# Patient Record
Sex: Female | Born: 1965 | Race: White | Hispanic: No | Smoking: Never smoker
Health system: Southern US, Community
[De-identification: ages and names within clinical notes are randomized; demographics above are authoritative.]

## PROBLEM LIST (undated history)

## (undated) DIAGNOSIS — F909 Attention-deficit hyperactivity disorder, unspecified type: Secondary | ICD-10-CM

## (undated) DIAGNOSIS — L719 Rosacea, unspecified: Secondary | ICD-10-CM

## (undated) DIAGNOSIS — R0681 Apnea, not elsewhere classified: Secondary | ICD-10-CM

## (undated) DIAGNOSIS — R011 Cardiac murmur, unspecified: Secondary | ICD-10-CM

## (undated) DIAGNOSIS — G35 Multiple sclerosis: Secondary | ICD-10-CM

## (undated) DIAGNOSIS — D126 Benign neoplasm of colon, unspecified: Secondary | ICD-10-CM

## (undated) DIAGNOSIS — A6000 Herpesviral infection of urogenital system, unspecified: Secondary | ICD-10-CM

## (undated) DIAGNOSIS — K589 Irritable bowel syndrome without diarrhea: Secondary | ICD-10-CM

## (undated) DIAGNOSIS — J309 Allergic rhinitis, unspecified: Secondary | ICD-10-CM

## (undated) DIAGNOSIS — H1045 Other chronic allergic conjunctivitis: Secondary | ICD-10-CM

## (undated) DIAGNOSIS — I82409 Acute embolism and thrombosis of unspecified deep veins of unspecified lower extremity: Secondary | ICD-10-CM

## (undated) DIAGNOSIS — H469 Unspecified optic neuritis: Secondary | ICD-10-CM

## (undated) DIAGNOSIS — R000 Tachycardia, unspecified: Secondary | ICD-10-CM

## (undated) DIAGNOSIS — K219 Gastro-esophageal reflux disease without esophagitis: Secondary | ICD-10-CM

## (undated) DIAGNOSIS — R251 Tremor, unspecified: Secondary | ICD-10-CM

## (undated) DIAGNOSIS — F509 Eating disorder, unspecified: Secondary | ICD-10-CM

## (undated) DIAGNOSIS — K900 Celiac disease: Secondary | ICD-10-CM

## (undated) DIAGNOSIS — N200 Calculus of kidney: Secondary | ICD-10-CM

## (undated) DIAGNOSIS — I10 Essential (primary) hypertension: Secondary | ICD-10-CM

## (undated) DIAGNOSIS — F411 Generalized anxiety disorder: Secondary | ICD-10-CM

## (undated) DIAGNOSIS — J45909 Unspecified asthma, uncomplicated: Secondary | ICD-10-CM

## (undated) HISTORY — DX: Allergic rhinitis, unspecified: J30.9

## (undated) HISTORY — DX: Unspecified optic neuritis: H46.9

## (undated) HISTORY — DX: Cardiac murmur, unspecified: R01.1

## (undated) HISTORY — PX: OTHER SURGICAL HISTORY: SHX169

## (undated) HISTORY — DX: Unspecified asthma, uncomplicated: J45.909

## (undated) HISTORY — DX: Celiac disease: K90.0

## (undated) HISTORY — DX: Attention-deficit hyperactivity disorder, unspecified type: F90.9

## (undated) HISTORY — DX: Apnea, not elsewhere classified: R06.81

## (undated) HISTORY — DX: Tremor, unspecified: R25.1

## (undated) HISTORY — DX: Multiple sclerosis: G35

## (undated) HISTORY — DX: Tachycardia, unspecified: R00.0

## (undated) HISTORY — DX: Eating disorder, unspecified: F50.9

## (undated) HISTORY — DX: Other chronic allergic conjunctivitis: H10.45

## (undated) HISTORY — DX: Herpesviral infection of urogenital system, unspecified: A60.00

## (undated) HISTORY — DX: Calculus of kidney: N20.0

## (undated) HISTORY — DX: Generalized anxiety disorder: F41.1

## (undated) HISTORY — DX: Rosacea, unspecified: L71.9

## (undated) HISTORY — DX: Irritable bowel syndrome, unspecified: K58.9

## (undated) HISTORY — DX: Benign neoplasm of colon, unspecified: D12.6

---
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
## 1994-07-08 HISTORY — PX: ROTATOR CUFF REPAIR: SHX139

## 2000-07-08 DIAGNOSIS — I82409 Acute embolism and thrombosis of unspecified deep veins of unspecified lower extremity: Secondary | ICD-10-CM

## 2000-07-08 HISTORY — DX: Acute embolism and thrombosis of unspecified deep veins of unspecified lower extremity: I82.409

## 2000-07-08 HISTORY — PX: CHOLECYSTECTOMY: SHX55

## 2001-10-21 ENCOUNTER — Other Ambulatory Visit: Admission: RE | Admit: 2001-10-21 | Discharge: 2001-10-21 | Payer: Self-pay | Admitting: *Deleted

## 2001-10-26 ENCOUNTER — Encounter: Admission: RE | Admit: 2001-10-26 | Discharge: 2001-10-26 | Payer: Self-pay | Admitting: *Deleted

## 2001-10-26 ENCOUNTER — Encounter: Payer: Self-pay | Admitting: *Deleted

## 2001-12-17 ENCOUNTER — Encounter: Payer: Self-pay | Admitting: Emergency Medicine

## 2001-12-17 ENCOUNTER — Emergency Department (HOSPITAL_COMMUNITY): Admission: EM | Admit: 2001-12-17 | Discharge: 2001-12-17 | Payer: Self-pay | Admitting: Emergency Medicine

## 2002-04-24 ENCOUNTER — Encounter: Payer: Self-pay | Admitting: Emergency Medicine

## 2002-04-24 ENCOUNTER — Inpatient Hospital Stay (HOSPITAL_COMMUNITY): Admission: EM | Admit: 2002-04-24 | Discharge: 2002-05-14 | Payer: Self-pay | Admitting: Emergency Medicine

## 2002-04-26 ENCOUNTER — Encounter: Payer: Self-pay | Admitting: *Deleted

## 2002-04-27 ENCOUNTER — Encounter: Payer: Self-pay | Admitting: *Deleted

## 2002-04-29 ENCOUNTER — Encounter: Payer: Self-pay | Admitting: *Deleted

## 2002-05-03 ENCOUNTER — Encounter: Payer: Self-pay | Admitting: *Deleted

## 2002-05-07 ENCOUNTER — Encounter: Payer: Self-pay | Admitting: *Deleted

## 2002-05-13 ENCOUNTER — Encounter: Payer: Self-pay | Admitting: *Deleted

## 2002-05-18 ENCOUNTER — Emergency Department (HOSPITAL_COMMUNITY): Admission: EM | Admit: 2002-05-18 | Discharge: 2002-05-18 | Payer: Self-pay | Admitting: Emergency Medicine

## 2002-05-18 ENCOUNTER — Encounter: Payer: Self-pay | Admitting: Emergency Medicine

## 2002-09-21 ENCOUNTER — Encounter: Admission: RE | Admit: 2002-09-21 | Discharge: 2002-09-21 | Payer: Self-pay | Admitting: Gastroenterology

## 2002-09-21 ENCOUNTER — Encounter: Payer: Self-pay | Admitting: Gastroenterology

## 2002-10-25 ENCOUNTER — Ambulatory Visit (HOSPITAL_COMMUNITY): Admission: RE | Admit: 2002-10-25 | Discharge: 2002-10-25 | Payer: Self-pay | Admitting: Gastroenterology

## 2002-10-26 ENCOUNTER — Encounter: Payer: Self-pay | Admitting: Emergency Medicine

## 2002-10-26 ENCOUNTER — Emergency Department (HOSPITAL_COMMUNITY): Admission: EM | Admit: 2002-10-26 | Discharge: 2002-10-26 | Payer: Self-pay | Admitting: Emergency Medicine

## 2002-11-18 ENCOUNTER — Ambulatory Visit (HOSPITAL_COMMUNITY): Admission: RE | Admit: 2002-11-18 | Discharge: 2002-11-18 | Payer: Self-pay | Admitting: Family Medicine

## 2002-11-18 ENCOUNTER — Encounter: Payer: Self-pay | Admitting: Family Medicine

## 2002-11-25 ENCOUNTER — Encounter: Payer: Self-pay | Admitting: Family Medicine

## 2002-11-25 ENCOUNTER — Encounter: Payer: Self-pay | Admitting: Radiology

## 2002-11-25 ENCOUNTER — Encounter: Admission: RE | Admit: 2002-11-25 | Discharge: 2002-11-25 | Payer: Self-pay | Admitting: Family Medicine

## 2002-12-07 ENCOUNTER — Encounter: Payer: Self-pay | Admitting: Family Medicine

## 2002-12-07 ENCOUNTER — Encounter: Admission: RE | Admit: 2002-12-07 | Discharge: 2002-12-07 | Payer: Self-pay | Admitting: Family Medicine

## 2003-05-10 ENCOUNTER — Encounter: Admission: RE | Admit: 2003-05-10 | Discharge: 2003-05-10 | Payer: Self-pay | Admitting: Internal Medicine

## 2003-07-09 HISTORY — PX: APPENDECTOMY: SHX54

## 2003-07-27 ENCOUNTER — Encounter (INDEPENDENT_AMBULATORY_CARE_PROVIDER_SITE_OTHER): Payer: Self-pay | Admitting: Specialist

## 2003-07-27 ENCOUNTER — Inpatient Hospital Stay (HOSPITAL_COMMUNITY): Admission: EM | Admit: 2003-07-27 | Discharge: 2003-07-29 | Payer: Self-pay | Admitting: Emergency Medicine

## 2003-08-17 ENCOUNTER — Encounter: Admission: RE | Admit: 2003-08-17 | Discharge: 2003-08-17 | Payer: Self-pay | Admitting: Internal Medicine

## 2003-08-18 ENCOUNTER — Ambulatory Visit (HOSPITAL_COMMUNITY): Admission: RE | Admit: 2003-08-18 | Discharge: 2003-08-18 | Payer: Self-pay | Admitting: Gastroenterology

## 2003-09-05 ENCOUNTER — Encounter: Admission: RE | Admit: 2003-09-05 | Discharge: 2003-09-05 | Payer: Self-pay | Admitting: Internal Medicine

## 2003-10-17 ENCOUNTER — Ambulatory Visit (HOSPITAL_COMMUNITY): Admission: RE | Admit: 2003-10-17 | Discharge: 2003-10-17 | Payer: Self-pay | Admitting: Obstetrics and Gynecology

## 2004-09-07 ENCOUNTER — Inpatient Hospital Stay (HOSPITAL_COMMUNITY): Admission: AD | Admit: 2004-09-07 | Discharge: 2004-09-11 | Payer: Self-pay | Admitting: Pediatrics

## 2005-09-30 ENCOUNTER — Ambulatory Visit (HOSPITAL_COMMUNITY): Admission: RE | Admit: 2005-09-30 | Discharge: 2005-09-30 | Payer: Self-pay | Admitting: Cardiovascular Disease

## 2005-12-15 ENCOUNTER — Emergency Department (HOSPITAL_COMMUNITY): Admission: EM | Admit: 2005-12-15 | Discharge: 2005-12-15 | Payer: Self-pay | Admitting: Emergency Medicine

## 2006-07-08 HISTORY — PX: ABDOMINAL HYSTERECTOMY: SHX81

## 2006-09-05 ENCOUNTER — Encounter (INDEPENDENT_AMBULATORY_CARE_PROVIDER_SITE_OTHER): Payer: Self-pay | Admitting: Specialist

## 2006-09-05 ENCOUNTER — Ambulatory Visit (HOSPITAL_COMMUNITY): Admission: RE | Admit: 2006-09-05 | Discharge: 2006-09-05 | Payer: Self-pay | Admitting: Obstetrics and Gynecology

## 2006-09-19 ENCOUNTER — Ambulatory Visit (HOSPITAL_COMMUNITY): Admission: RE | Admit: 2006-09-19 | Discharge: 2006-09-19 | Payer: Self-pay | Admitting: Family Medicine

## 2006-11-26 ENCOUNTER — Ambulatory Visit (HOSPITAL_COMMUNITY): Admission: RE | Admit: 2006-11-26 | Discharge: 2006-11-26 | Payer: Self-pay | Admitting: Obstetrics and Gynecology

## 2006-11-26 ENCOUNTER — Encounter (INDEPENDENT_AMBULATORY_CARE_PROVIDER_SITE_OTHER): Payer: Self-pay | Admitting: Obstetrics and Gynecology

## 2007-01-26 ENCOUNTER — Ambulatory Visit: Payer: Self-pay | Admitting: Family Medicine

## 2007-06-22 ENCOUNTER — Encounter (INDEPENDENT_AMBULATORY_CARE_PROVIDER_SITE_OTHER): Payer: Self-pay | Admitting: Obstetrics and Gynecology

## 2007-06-22 ENCOUNTER — Inpatient Hospital Stay (HOSPITAL_COMMUNITY): Admission: RE | Admit: 2007-06-22 | Discharge: 2007-06-24 | Payer: Self-pay | Admitting: Obstetrics and Gynecology

## 2007-07-11 ENCOUNTER — Inpatient Hospital Stay (HOSPITAL_COMMUNITY): Admission: AD | Admit: 2007-07-11 | Discharge: 2007-07-12 | Payer: Self-pay | Admitting: Obstetrics and Gynecology

## 2007-10-31 ENCOUNTER — Ambulatory Visit (HOSPITAL_COMMUNITY): Admission: RE | Admit: 2007-10-31 | Discharge: 2007-10-31 | Payer: Self-pay | Admitting: Orthopedic Surgery

## 2008-01-09 ENCOUNTER — Emergency Department (HOSPITAL_BASED_OUTPATIENT_CLINIC_OR_DEPARTMENT_OTHER): Admission: EM | Admit: 2008-01-09 | Discharge: 2008-01-09 | Payer: Self-pay | Admitting: Emergency Medicine

## 2008-02-19 ENCOUNTER — Emergency Department (HOSPITAL_BASED_OUTPATIENT_CLINIC_OR_DEPARTMENT_OTHER): Admission: EM | Admit: 2008-02-19 | Discharge: 2008-02-19 | Payer: Self-pay | Admitting: Emergency Medicine

## 2008-02-29 ENCOUNTER — Emergency Department (HOSPITAL_BASED_OUTPATIENT_CLINIC_OR_DEPARTMENT_OTHER): Admission: EM | Admit: 2008-02-29 | Discharge: 2008-02-29 | Payer: Self-pay | Admitting: Emergency Medicine

## 2008-07-31 ENCOUNTER — Emergency Department (HOSPITAL_BASED_OUTPATIENT_CLINIC_OR_DEPARTMENT_OTHER): Admission: EM | Admit: 2008-07-31 | Discharge: 2008-07-31 | Payer: Self-pay | Admitting: Emergency Medicine

## 2008-11-22 ENCOUNTER — Ambulatory Visit: Payer: Self-pay | Admitting: Family Medicine

## 2008-11-25 ENCOUNTER — Ambulatory Visit: Payer: Self-pay | Admitting: Family Medicine

## 2008-12-02 ENCOUNTER — Ambulatory Visit: Payer: Self-pay | Admitting: Family Medicine

## 2009-05-23 ENCOUNTER — Ambulatory Visit: Payer: Self-pay | Admitting: Family Medicine

## 2009-08-18 ENCOUNTER — Ambulatory Visit: Payer: Self-pay | Admitting: Family Medicine

## 2009-09-05 ENCOUNTER — Ambulatory Visit: Payer: Self-pay | Admitting: Family Medicine

## 2009-09-05 ENCOUNTER — Encounter: Payer: Self-pay | Admitting: Internal Medicine

## 2009-09-05 HISTORY — PX: CARDIAC CATHETERIZATION: SHX172

## 2009-09-07 ENCOUNTER — Inpatient Hospital Stay (HOSPITAL_COMMUNITY): Admission: AD | Admit: 2009-09-07 | Discharge: 2009-09-09 | Payer: Self-pay | Admitting: Cardiovascular Disease

## 2009-09-08 ENCOUNTER — Encounter (INDEPENDENT_AMBULATORY_CARE_PROVIDER_SITE_OTHER): Payer: Self-pay | Admitting: Cardiovascular Disease

## 2009-10-20 ENCOUNTER — Encounter: Payer: Self-pay | Admitting: Internal Medicine

## 2009-10-23 ENCOUNTER — Ambulatory Visit: Payer: Self-pay | Admitting: Internal Medicine

## 2009-10-23 DIAGNOSIS — I10 Essential (primary) hypertension: Secondary | ICD-10-CM | POA: Insufficient documentation

## 2009-10-23 DIAGNOSIS — J309 Allergic rhinitis, unspecified: Secondary | ICD-10-CM

## 2009-10-23 DIAGNOSIS — R0602 Shortness of breath: Secondary | ICD-10-CM

## 2009-10-23 DIAGNOSIS — G35 Multiple sclerosis: Secondary | ICD-10-CM

## 2009-10-23 DIAGNOSIS — K219 Gastro-esophageal reflux disease without esophagitis: Secondary | ICD-10-CM | POA: Insufficient documentation

## 2009-10-23 DIAGNOSIS — F909 Attention-deficit hyperactivity disorder, unspecified type: Secondary | ICD-10-CM | POA: Insufficient documentation

## 2009-10-25 ENCOUNTER — Ambulatory Visit: Payer: Self-pay | Admitting: Internal Medicine

## 2009-10-26 ENCOUNTER — Telehealth (INDEPENDENT_AMBULATORY_CARE_PROVIDER_SITE_OTHER): Payer: Self-pay | Admitting: *Deleted

## 2009-11-01 ENCOUNTER — Telehealth: Payer: Self-pay | Admitting: Internal Medicine

## 2009-11-08 ENCOUNTER — Ambulatory Visit (HOSPITAL_COMMUNITY): Admission: RE | Admit: 2009-11-08 | Discharge: 2009-11-08 | Payer: Self-pay | Admitting: Internal Medicine

## 2009-11-08 ENCOUNTER — Encounter: Payer: Self-pay | Admitting: Internal Medicine

## 2009-11-13 ENCOUNTER — Telehealth (INDEPENDENT_AMBULATORY_CARE_PROVIDER_SITE_OTHER): Payer: Self-pay | Admitting: *Deleted

## 2009-11-22 ENCOUNTER — Ambulatory Visit: Payer: Self-pay | Admitting: Internal Medicine

## 2010-04-30 ENCOUNTER — Encounter: Admission: RE | Admit: 2010-04-30 | Discharge: 2010-04-30 | Payer: Self-pay | Admitting: Family Medicine

## 2010-05-02 ENCOUNTER — Encounter: Admission: RE | Admit: 2010-05-02 | Discharge: 2010-05-02 | Payer: Self-pay | Admitting: Family Medicine

## 2010-05-04 ENCOUNTER — Ambulatory Visit (HOSPITAL_COMMUNITY): Admission: RE | Admit: 2010-05-04 | Discharge: 2010-05-04 | Payer: Self-pay | Admitting: Family Medicine

## 2010-05-17 ENCOUNTER — Ambulatory Visit (HOSPITAL_COMMUNITY): Admission: RE | Admit: 2010-05-17 | Discharge: 2010-05-17 | Payer: Self-pay | Admitting: Family Medicine

## 2010-05-25 ENCOUNTER — Ambulatory Visit: Payer: Self-pay | Admitting: Internal Medicine

## 2010-06-07 ENCOUNTER — Ambulatory Visit: Payer: Self-pay | Admitting: Internal Medicine

## 2010-06-07 HISTORY — PX: OTHER SURGICAL HISTORY: SHX169

## 2010-07-08 ENCOUNTER — Ambulatory Visit: Payer: Self-pay | Admitting: Internal Medicine

## 2010-07-18 ENCOUNTER — Ambulatory Visit: Payer: Self-pay | Admitting: Internal Medicine

## 2010-07-29 ENCOUNTER — Encounter: Payer: Self-pay | Admitting: Obstetrics and Gynecology

## 2010-08-01 ENCOUNTER — Ambulatory Visit: Payer: Self-pay | Admitting: Internal Medicine

## 2010-08-07 NOTE — Progress Notes (Signed)
Summary: results-lmtcb  Phone Note Call from Patient   Caller: Patient Call For: [name] Summary of Call: calling for pft results Initial call taken by: Rickard Patience,  November 01, 2009 1:22 PM  Follow-up for Phone Call        Please advise PFT results thank you [name]  November 01, 2009 1:50 PM   Additional Follow-up for Phone Call Additional follow up Details #1::        pft 10/25/2009 are essentially normal/near normal. Does not explain dyspnea. Pls set up methacnoline challenge test (cannot be pregnant) and if on advair or symbicort needs to be off it for 2-3 weeks. Needs fu appt after mc test Additional Follow-up by: Kalman Shan MD,  November 02, 2009 3:59 PM    Additional Follow-up for Phone Call Additional follow up Details #2::    LMTCB.[name] CMA  November 02, 2009 4:03 PM  pt advsied of results. order palced for methacoline test and f/u after with MR. [name] CMA  November 02, 2009 4:28 PM

## 2010-08-07 NOTE — Miscellaneous (Signed)
Summary: Orders Update pft charges  Clinical Lists Changes  Orders: Added new Service order of Carbon Monoxide diffusing w/capacity (94720) - Signed Added new Service order of Lung Volumes (94240) - Signed Added new Service order of Spirometry (Pre & Post) (94060) - Signed 

## 2010-08-07 NOTE — Letter (Signed)
Summary: [facility]   Imported By: Lester [place] 10/26/2009 12:43:14  _____________________________________________________________________  External Attachment:    Type:   Image     Comment:   External Document

## 2010-08-07 NOTE — Letter (Signed)
Summary: [facility]   Imported By: Lester [place] 10/26/2009 12:46:27  _____________________________________________________________________  External Attachment:    Type:   Image     Comment:   External Document

## 2010-08-07 NOTE — Progress Notes (Signed)
Summary: MCT results  Phone Note Call from Patient Call back at Home Phone [phone]   Caller: Patient Call For: ramasawmy Summary of Call: pt wants the results of methocoline test Initial call taken by: Lacinda Axon,  Nov 13, 2009 12:28 PM  Follow-up for Phone Call        Please advise results of MCT, thanks Vernie Murders  Nov 13, 2009 1:10 PM   Additional Follow-up for Phone Call Additional follow up Details #1::        pc 20 IS 4MG /DL.  This is a positive test . Please have Byrum do the official read. You can tell her it is prelim positive. Have her come in to discuss Rx options Additional Follow-up by: Kalman Shan MD,  Nov 14, 2009 6:27 PM    Additional Follow-up for Phone Call Additional follow up Details #2::    Spoke with pt and advised per MR that preliminary results do show positive test.  Appt was sched with MR to discuss these results for 11/27/09 at 3:10 pm.  Will forward this to RB per MR request.   Follow-up by: Vernie Murders,  Nov 15, 2009 9:00 AM  Additional Follow-up for Phone Call Additional follow up Details #3:: Details for Additional Follow-up Action Taken: It has already been read. I agree with results as above. RSB Additional Follow-up by: Leslye Peer MD,  Nov 15, 2009 9:08 AM

## 2010-08-07 NOTE — Assessment & Plan Note (Signed)
Summary: doe and fatigue/jd   Visit Type:  Initial Consult Primary Provider/Referring Provider:  Dr. Nilda Simmer - PMD, Dr Erlene Quan - Cards, Dr Loreta Ave - GI  CC:  Pulmonary consult for SOb with exertion x 2 months. .  History of Present Illness: 45 year old  obese female with severe GERD with 30# intentional weight loss since Sept 2010.  Had pneumonia in Nov 2010 treated as outpatient (reportedly cxr showed it). Had flu like illness 08/18/2009. After that exhaustion. 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 Resolved but recurred 7-10 days later. Therefore, admitted at [facility] under Dr. Erlene Quan for cardiac cath Mrach 4-5, 2011. CT angio September 09, 2009 and Left heart cath September 09, 2009 was normal (reviewed in [place]). Since then or even before that started noticing dyspnea but chest pain has resolved But definitely dyspnea since flu but not before flu in Feb 2011. Insidious onset. STable symptoms since onset but definitely not getting better. Overall severity is moderate. Dyspnea mostly brought on by exertion and relieved by rest. She is also reporting paroxysmal nocturnal dyspnea 10 times each month (wakes up diaphoretic, dyspneic, dizzy, tachycardic and "low bp in 90 sbp", palpitations). Occ does have daytime palpitatons. Denies orthopnea (uses 3 pillow usage for past 9-10 months due to GERD), chest pains, fevers.   Of note, OSA workup pending at [facility] dr. Allyson Sabal due to nocturnal symptoms.  pscyhosocial stressors - son car issues, son wiht finger suture, friend had cholecystectomy  NOTE: hx from echart, outside records and patient  Preventive Screening-Counseling & Management  Alcohol-Tobacco     Smoking Status: never  Current Medications (verified): 1)  Adderall Xr 30 Mg Xr24h-Cap (Amphetamine-Dextroamphetamine) .... Take 1 Tablet By Mouth Once A Day 2)  Adderall Xr 5 Mg Xr24h-Cap (Amphetamine-Dextroamphetamine) .... Take 1 Tablet By Mouth Once A Day 3)  Concerta 54 Mg  Cr-Tabs (Methylphenidate Hcl) .... Take 1 Tablet By Mouth Once A Day 4)  Valtrex 500 Mg Tabs (Valacyclovir Hcl) .... Take 1 Tablet By Mouth Once A Day 5)  Hydrochlorothiazide 25 Mg Tabs (Hydrochlorothiazide) .... Take 1 Tablet By Mouth Once A Day 6)  Dexilant 60 Mg Cpdr (Dexlansoprazole) .... Take 1 Tablet By Mouth Once A Day  Allergies (verified): 1)  ! * Ketek 2)  ! Levaquin 3)  ! Erythromycin 4)  ! Codeine 5)  ! * Oxycotin 6)  ! * Shellfish  Past History:  Family History: Last updated: 10/23/2009 Mother-HTN, 2 DVT Father-HTN MGM-colon cancer M aunt-colon cancer Son had childhood asthma but outgrew  Social History: Last updated: 10/23/2009 Divorced. lives with son and friend Has one son Patient never smoked.  attends [employer] No passive smoking Son born in [place]  Risk Factors: Smoking Status: never (10/23/2009)  Past Medical History: #Allergic Rhinitis #G E R D...Marland KitchenMarland KitchenDr Loreta Ave #Obesity...weight watchers > 30# weight loss Sept 2010 - April 2011  with improvment in GERD. 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 Admitted for many weeks at [facility] in [place], [place]  # Left "behind the knee vein" blood clot  summer 2010.Marland KitchenMarland KitchenMarland KitchenDr Esaw Dace > sounds like SVT. Was treated as outpatient. Rx with Mobic. And was reassured "it wont move"  Herpes genital-054.10 HIV negative 2010 ADHD-314.00 Multiple Sclerosis  Labs ordered by R. Nilda Simmer 10/20/09: Hgb-14.9, hematocrit-42.7, BUN-12,  CREATININE-0.84, AST-18, ALT-21, TSH-2.050  Past Surgical History: Appendectomy-2005 Cholecystectomy-2003 Angioplasty-09-2009 T A H and B S O-2009  Family History: Mother-HTN, 2 DVT Father-HTN MGM-colon cancer M aunt-colon cancer Son had childhood asthma but outgrew  Social History: Divorced. lives with son and friend Has  one son Patient never smoked.  attends [employer] No passive smoking Son born in 1990Smoking Status:  never  Review of Systems       The patient complains of shortness of breath with activity, shortness of breath at rest, chest pain, irregular heartbeats, acid heartburn, and weight change.  The patient denies productive cough, non-productive cough, coughing up blood, indigestion, loss of appetite, abdominal pain, difficulty swallowing, sore throat, tooth/dental problems, headaches, nasal congestion/difficulty breathing through nose, sneezing, itching, ear ache, anxiety, depression, hand/feet swelling, joint stiffness or pain, rash, change in color of mucus, and fever.         30# interntional weight loss  Vital Signs:  Patient profile:   45 year old female Height:      60 inches Weight:      171 pounds BMI:     33.52 O2 Sat:      100 % on Room air Temp:     98 degrees F oral Pulse rate:   70 / minute BP sitting:   120 / 70  (right arm) Cuff size:   regular  Vitals Entered By: Carron Curie CMA (October 23, 2009 10:09 AM)  O2 Flow:  Room air CC: Pulmonary consult for SOb with exertion x 2 months.   Does patient need assistance? Ambulation Bedbound Comments Ambulatory Pulse Oximetry  Resting; HR_95____    02 Sat_100____  Lap1 (185 feet)   HR_91____   02 Sat_97____ Lap2 (185 feet)   HR__92___   02 Sat_96____    Lap3 (185 feet)   HR_85____   02 Sat__92___  _X__Test Completed without Difficulty ___Test Stopped due to: Denna Haggard, CMA  October 23, 2009 11:00 AM      Physical Exam  General:  well developed, well nourished, in no acute distressobese.   Head:  normocephalic and atraumatic Eyes:  PERRLA/EOM intact; conjunctiva and sclera clear Ears:  TMs intact and clear with normal canals Nose:  no deformity, discharge, inflammation, or lesions Mouth:  no deformity or lesions Neck:  no masses, thyromegaly, or abnormal cervical nodes Chest Wall:  no  deformities noted Lungs:  clear bilaterally to auscultation and percussion Heart:  regular rate and rhythm, S1, S2 without murmurs, rubs, gallops, or clicks Abdomen:  bowel sounds positive; abdomen soft and non-tender without masses, or organomegaly Msk:  no deformity or scoliosis noted with normal posture Pulses:  pulses normal Extremities:  no clubbing, cyanosis, edema, or deformity noted Neurologic:  CN II-XII grossly intact with normal reflexes, coordination, muscle strength and tone Skin:  intact without lesions or rashes Cervical Nodes:  no significant adenopathy Axillary Nodes:  no significant adenopathy Psych:  alert and cooperative; normal mood and affect; normal attention span and concentration   CXR  Procedure date:  08/18/2009  Findings:      outside cxr personally reviwed: looks clear to me withouth abnormalities  CT of Chest  Procedure date:  09/09/2009  Findings:      personally reviwed. done at [facility]. Low lung volumes with dependent atelctasis. No PE  Comments:      independently reviwed and agree  MISC. Report  Procedure date:  10/20/2009  Findings:      Labs ordered  by Carole Binning 10/20/09: Hgb-14.9, hematocrit-42.7, BUN-12, CREATININE-0.84, AST-18, ALT-21, TSH-2.050  Impression & Recommendations:  Problem # 1:  SHORTNESS OF BREATH (SOB) (ICD-786.05) Assessment New  Possibly due to post flu deconditioning versus obesity versus occult asthma. Doubt pulmonary hypertension  plan full PFT if normal do methacholine challenges test versus CPST  Orders: Consultation Level V (16109) Pulmonary Referral (Pulmonary)  Medications Added to Medication List This Visit: 1)  Adderall Xr 30 Mg Xr24h-cap (Amphetamine-dextroamphetamine) .... Take 1 tablet by mouth once a day 2)  Adderall Xr 5 Mg Xr24h-cap (Amphetamine-dextroamphetamine) .... Take 1 tablet by mouth once a day 3)  Concerta 54 Mg Cr-tabs (Methylphenidate hcl) .... Take 1 tablet by mouth once a  day 4)  Valtrex 500 Mg Tabs (Valacyclovir hcl) .... Take 1 tablet by mouth once a day 5)  Hydrochlorothiazide 25 Mg Tabs (Hydrochlorothiazide) .... Take 1 tablet by mouth once a day 6)  Dexilant 60 Mg Cpdr (Dexlansoprazole) .... Take 1 tablet by mouth once a day  Patient Instructions: 1)  please have full pft breathing test 2)  based on the results we will advice on next step 3)  please call us after breathing test

## 2010-08-07 NOTE — Progress Notes (Signed)
Summary: results-LMTCB x 1   Phone Note Call from Patient Call back at Home Phone [phone]   Caller: Patient Call For: ramaswamy Reason for Call: Talk to Nurse, Talk to Doctor, Lab or Test Results Summary of Call: results of pft and directions for next step. Initial call taken by: Eugene Gavia,  October 26, 2009 2:03 PM  Follow-up for Phone Call        Pt calling for PFT results.  Test was done on 4/20.  MR not back in the office until 4/21.  LMTCB. Vernie Murders  October 26, 2009 2:37 PM  returned phone call Darletta Moll  October 26, 2009 2:46 PM  Spoke with pt and advised that we will place her results in MR's looat for him to review when he returns to office next wk on 4/27.  Pt verbalized understanding.  Pls advise results once reviewed thanks   Follow-up by: Vernie Murders,  October 26, 2009 2:51 PM

## 2010-08-07 NOTE — Assessment & Plan Note (Signed)
Summary: discuss methacholine challenge test   Visit Type:  Follow-up Primary Provider/Referring Provider:  Dr. Nilda Simmer - PMD, Dr Erlene Quan - Cards, Dr Loreta Ave - GI  CC:  Pt here for test results and pt still has SOB.  History of Present Illness: 45 year old  obese female with severe GERD with 30# intentional weight loss since Sept 2010.  Had pneumonia in Nov 2010 treated as outpatient (reportedly cxr showed it). Had flu like illness 08/18/2009. After that exhaustion. 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 Resolved but recurred 7-10 days later. Therefore, admitted at [facility] under Dr. Erlene Quan for cardiac cath Mrach 4-5, 2011. CT angio September 09, 2009 and Left heart cath September 09, 2009 was normal (reviewed in [place]). Since then or even before that started noticing dyspnea but chest pain has resolved But definitely dyspnea since flu but not before flu in Feb 2011. Insidious onset. STable symptoms since onset but definitely not getting better. Overall severity is moderate. Dyspnea mostly brought on by exertion and relieved by rest. She is also reporting paroxysmal nocturnal dyspnea 10 times each month (wakes up diaphoretic, dyspneic, dizzy, tachycardic and "low bp in 90 sbp", palpitations). Occ does have daytime palpitatons. Denies orthopnea (uses 3 pillow usage for past 9-10 months due to GERD), chest pains, fevers.   Of note, OSA workup pending at [facility] dr. Allyson Sabal due to nocturnal symptoms.  pscyhosocial stressors - son car issues, son wiht finger suture, friend had cholecystectomy  NOTE: hx from echart, outside records and patient  REC PFT -> if negative methacholine challenge testOV 11/22/2009: Followup dyspnea. Had PFTs 10/25/2009. I reviewed this and was normal. Had methacholine challenge test on 11/08/2009. This was positive for asthma at PC20 of 4. I personally reviewed the test. In the interim since lsat visit she is doing the same. No change in dyspnea. Still dyspneic  on exertion and on exposure to humid or warm air. Still with paroxysmal nocturnal dyspnea. No new symptoms. Overall rates symptoms as moderate. Associated GERD is better controlled but weight loss has plateaued.   Current Medications (verified): 1)  Adderall Xr 30 Mg Xr24h-Cap (Amphetamine-Dextroamphetamine) .... Take 1 Tablet By Mouth Once A Day 2)  Adderall Xr 5 Mg Xr24h-Cap (Amphetamine-Dextroamphetamine) .... Take 1 Tablet By Mouth Once A Day 3)  Valtrex 500 Mg Tabs (Valacyclovir Hcl) .... Take 1 Tablet By Mouth Once A Day 4)  Hydrochlorothiazide 25 Mg Tabs (Hydrochlorothiazide) .... Take 1 Tablet By Mouth Once A Day 5)  Dexilant 60 Mg Cpdr (Dexlansoprazole) .... Take 1 Tablet By Mouth Once A Day  Allergies: 1)  ! * Ketek 2)  ! Levaquin 3)  ! Erythromycin 4)  ! Codeine 5)  ! * Oxycotin 6)  ! * Shellfish  Past History:  Family History: Last updated: 10/23/2009 Mother-HTN, 2 DVT Father-HTN MGM-colon cancer M aunt-colon cancer Son had childhood asthma but outgrew  Social History: Last updated: 10/23/2009 Divorced. lives with son and friend Has one son Patient never smoked.  attends [employer] No passive smoking Son born in 1990  Risk Factors: Smoking Status: never (10/23/2009)  Past Medical History: Reviewed history from 10/23/2009 and no changes required. #Allergic Rhinitis #G E R D...Marland KitchenMarland KitchenDr Loreta Ave #Obesity...weight watchers > 30# weight loss Sept 2010 - April 2011  with improvment in GERD. > wegith loss undertaken due to severe GERD > Denies use of weight loss pill #Hypertension #Kidney Stone #Hx of thrombosis of lower extremity at age 30 >  Rt femoral artery age 38. Admitted for many weeks at [facility] in [place], [place]  # Left "behind the knee vein" blood clot  summer 2010.Marland KitchenMarland KitchenMarland KitchenDr Esaw Dace > sounds like SVT. Was treated as outpatient. Rx with Mobic. And was reassured "it wont move"  Herpes genital-054.10 HIV negative  2010 ADHD-314.00 Multiple Sclerosis  Labs ordered by R. Nilda Simmer 10/20/09: Hgb-14.9, hematocrit-42.7, BUN-12, CREATININE-0.84, AST-18, ALT-21, TSH-2.050  Past Surgical History: Reviewed history from 10/23/2009 and no changes required. Appendectomy-2005 Cholecystectomy-2003 Angioplasty-09-2009 T A H and B S O-2009  Family History: Reviewed history from 10/23/2009 and no changes required. Mother-HTN, 2 DVT Father-HTN MGM-colon cancer M aunt-colon cancer Son had childhood asthma but outgrew  Social History: Reviewed history from 10/23/2009 and no changes required. Divorced. lives with son and friend Has one son Patient never smoked.  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  The patient denies shortness of breath at rest, productive cough, coughing up blood, chest pain, irregular heartbeats, acid heartburn, indigestion, loss of appetite, weight change, abdominal pain, difficulty swallowing, sore throat, tooth/dental problems, headaches, nasal congestion/difficulty breathing through nose, sneezing, itching, ear ache, anxiety, depression, hand/feet swelling, joint stiffness or pain, rash, change in color of mucus, and fever.    Vital Signs:  Patient profile:   45 year old female Height:      60 inches Weight:      171 pounds BMI:     33.52 O2 Sat:      99 % on Room air Temp:     98.0 degrees F oral Pulse rate:   90 / minute BP sitting:   118 / 72  (right arm) Cuff size:   regular  Vitals Entered By: Carver Fila SMA(Nov 22, 2009 2:49 PM)  O2 Flow:  Room air CC: Pt here for test results, pt still has SOB Comments meds and allergies updated Daytime phone number verified with patient. Carron Curie CMA  Nov 22, 2009 2:48 PM    Physical Exam  General:  well developed, well nourished, in no acute distressobese.   Head:   normocephalic and atraumatic Eyes:  PERRLA/EOM intact; conjunctiva and sclera clear Ears:  TMs intact and clear with normal canals Nose:  no deformity, discharge, inflammation, or lesions Mouth:  no deformity or lesions Neck:  no masses, thyromegaly, or abnormal cervical nodes Chest Wall:  no deformities noted Lungs:  clear bilaterally to auscultation and percussion Heart:  regular rate and rhythm, S1, S2 without murmurs, rubs, gallops, or clicks Abdomen:  bowel sounds positive; abdomen soft and non-tender without masses, or organomegaly Msk:  no deformity or scoliosis noted with normal posture Pulses:  pulses normal Extremities:  no clubbing, cyanosis, edema, or deformity noted Neurologic:  CN II-XII grossly intact with normal reflexes, coordination, muscle strength and tone Skin:  intact without lesions or rashes Cervical Nodes:  no significant adenopathy Axillary Nodes:  no significant adenopathy Psych:  alert and cooperative; normal mood and affect; normal attention span and concentration   MISC. Report  Procedure date:  11/08/2009  Findings:       Had PFTs 10/25/2009. I reviewed this and was normal. Had methacholine challenge test on 11/08/2009. This was positive for asthma at PC20 of 4. I personally reviewed the test.   Impression & Recommendations:  Problem # 1:  SHORTNESS OF BREATH (SOB) (ICD-786.05)  Assessment Unchanged  She has asthma per methacholine challlenge test. ? brought on by one hit of flu like symptoms.   plan start symbicort 2 puff two times a day  (educated on maintenance philosophy and techique and need to rinse mouth and side effects) albuterol  2 puff as needed rov 6 weeks with spiro probably needs 6-12 month Rx before reasessment for weaning off advised to control GERD  Orders: Est. Patient Level III (30865) Prescription Created Electronically [phone])  Medications Added to Medication List This Visit: 1)  Symbicort 80-4.5 Mcg/act Aero  (Budesonide-formoterol fumarate) .... Two puffs twice daily 2)  Proair Hfa 108 (90 Base) Mcg/act Aers (Albuterol sulfate) .Marland Kitchen.. 1-2 puffs every 4-6 hours as needed  Patient Instructions: 1)  we have to start you  on asthma inhalers 2)  start low dose symbicort 2 puff two times a day 3)  rinse your mouth after use 4)  Symbicort is your maintenance medicine 5)  Use pro-air 2 puff as needed for symptoms 6)  Return in 6 weeks with spirometry at followup 7)  Return sooner or call if there are problems 8)  Work on controlling acid reflux Prescriptions: PROAIR HFA 108 (90 BASE) MCG/ACT  AERS (ALBUTEROL SULFATE) 1-2 puffs every 4-6 hours as needed  #1 x 3   Entered and Authorized by:   Kalman Shan MD   Signed by:   Kalman Shan MD on 11/22/2009   Method used:   Print then Give to Patient   RxID:   [id number] SYMBICORT 80-4.5 MCG/ACT  AERO (BUDESONIDE-FORMOTEROL FUMARATE) Two puffs twice daily  #1 x 6   Entered and Authorized by:   Kalman Shan MD   Signed by:   Kalman Shan MD on 11/22/2009   Method used:   Print then Give to Patient   RxID:   [id number]    Immunization History:  Influenza Immunization History:    Influenza:  historical (05/08/2009)  Pneumovax Immunization History:    Pneumovax:  historical (05/08/2009)

## 2010-08-08 ENCOUNTER — Ambulatory Visit: Payer: Self-pay | Admitting: Internal Medicine

## 2010-09-06 ENCOUNTER — Ambulatory Visit: Payer: Self-pay | Admitting: Internal Medicine

## 2010-09-30 LAB — CBC
HCT: 42.5 % (ref 36.0–46.0)
MCHC: 34.5 g/dL (ref 30.0–36.0)
MCV: 92.6 fL (ref 78.0–100.0)
MCV: 92.7 fL (ref 78.0–100.0)
Platelets: 209 10*3/uL (ref 150–400)
Platelets: 222 10*3/uL (ref 150–400)
Platelets: 244 10*3/uL (ref 150–400)
RBC: 4.48 MIL/uL (ref 3.87–5.11)
RDW: 12.7 % (ref 11.5–15.5)
WBC: 17.8 10*3/uL — ABNORMAL HIGH (ref 4.0–10.5)
WBC: 7.7 10*3/uL (ref 4.0–10.5)

## 2010-09-30 LAB — PROTIME-INR: INR: 1.05 (ref 0.00–1.49)

## 2010-09-30 LAB — BRAIN NATRIURETIC PEPTIDE: Pro B Natriuretic peptide (BNP): <30 pg/mL (ref 0.0–100.0)

## 2010-09-30 LAB — CARDIAC PANEL(CRET KIN+CKTOT+MB+TROPI)
CK, MB: 1 ng/mL (ref 0.3–4.0)
CK, MB: 1.1 ng/mL (ref 0.3–4.0)
CK, MB: 1.1 ng/mL (ref 0.3–4.0)
Relative Index: INVALID (ref 0.0–2.5)
Total CK: 32 U/L (ref 7–177)
Total CK: 34 U/L (ref 7–177)
Total CK: 43 U/L (ref 7–177)
Troponin I: 0.01 ng/mL (ref 0.00–0.06)

## 2010-09-30 LAB — COMPREHENSIVE METABOLIC PANEL
Albumin: 4.1 g/dL (ref 3.5–5.2)
BUN: 12 mg/dL (ref 6–23)
Calcium: 9.4 mg/dL (ref 8.4–10.5)
Creatinine, Ser: 0.76 mg/dL (ref 0.4–1.2)
Total Protein: 7.4 g/dL (ref 6.0–8.3)

## 2010-09-30 LAB — BASIC METABOLIC PANEL
BUN: 10 mg/dL (ref 6–23)
BUN: 9 mg/dL (ref 6–23)
Chloride: 108 mEq/L (ref 96–112)
Creatinine, Ser: 0.7 mg/dL (ref 0.4–1.2)
Creatinine, Ser: 0.72 mg/dL (ref 0.4–1.2)
GFR calc Af Amer: >60 mL/min (ref >60)
GFR calc non Af Amer: >60 mL/min (ref >60)
GFR calc non Af Amer: >60 mL/min (ref >60)
Potassium: 3.7 mEq/L (ref 3.5–5.1)

## 2010-09-30 LAB — LIPID PANEL
Cholesterol: 118 mg/dL (ref 0–200)
HDL: 41 mg/dL (ref >39)
LDL Cholesterol: 69 mg/dL (ref 0–99)
Total CHOL/HDL Ratio: 2.9 RATIO
Triglycerides: 42 mg/dL (ref <150)

## 2010-09-30 LAB — MAGNESIUM: Magnesium: 2.1 mg/dL (ref 1.5–2.5)

## 2010-09-30 LAB — D-DIMER, QUANTITATIVE: D-Dimer, Quant: <0.22 ug/mL-FEU (ref 0.00–0.48)

## 2010-09-30 LAB — DIFFERENTIAL
Lymphocytes Relative: 27 % (ref 12–46)
Monocytes Absolute: 0.5 10*3/uL (ref 0.1–1.0)
Monocytes Relative: 6 % (ref 3–12)
Neutro Abs: 5.8 10*3/uL (ref 1.7–7.7)

## 2010-09-30 LAB — URINALYSIS, ROUTINE W REFLEX MICROSCOPIC
Hgb urine dipstick: NEGATIVE
Ketones, ur: 15 mg/dL — AB
Protein, ur: NEGATIVE mg/dL
Urobilinogen, UA: 0.2 mg/dL (ref 0.0–1.0)

## 2010-09-30 LAB — TSH: TSH: 1.506 u[IU]/mL (ref 0.350–4.500)

## 2010-09-30 LAB — URINE MICROSCOPIC-ADD ON

## 2010-09-30 LAB — HEMOGLOBIN A1C: Mean Plasma Glucose: 120 mg/dL

## 2010-10-10 ENCOUNTER — Ambulatory Visit: Payer: Self-pay | Admitting: Internal Medicine

## 2010-10-22 LAB — URINE MICROSCOPIC-ADD ON

## 2010-10-22 LAB — URINALYSIS, ROUTINE W REFLEX MICROSCOPIC
Nitrite: POSITIVE — AB
Specific Gravity, Urine: 1.031 — ABNORMAL HIGH (ref 1.005–1.030)
pH: 5 (ref 5.0–8.0)

## 2010-10-22 LAB — WET PREP, GENITAL: Clue Cells Wet Prep HPF POC: NONE SEEN

## 2010-11-06 ENCOUNTER — Ambulatory Visit: Payer: Self-pay | Admitting: Internal Medicine

## 2010-11-20 NOTE — H&P (Signed)
[name], [name]               ACCOUNT NO.:  [id number]   MEDICAL RECORD NO.:  [id number]          PATIENT TYPE:  MAT   LOCATION:  MATC                          FACILITY:  [facility]   PHYSICIAN:  Janine Limbo, M.D.DATE OF BIRTH:  1966/02/02   DATE OF ADMISSION:  07/10/2007  DATE OF DISCHARGE:                              HISTORY & PHYSICAL   PRIORITY ADMISSION HISTORY AND PHYSICAL   The patient is a 45 year old, white female, who is status post a total  abdominal hysterectomy on June 22, 2007, who presented to [facility]  earlier this evening with complaints of a fever since yesterday with a  max of 101, moderate to severe lower abdominal pain, chills, weakness,  nausea, dysuria, and mid to low back pain, which she stated felt like I  have been kicked by a horse.  She reports that the pain symptoms began  about three days ago.  She reports intermittent vaginal spotting since  her surgery.  She was seen in the office today for these complaints by  Henreitta Leber, Physician's Assistant, and Dr. Normand Sloop, and she was  subsequently sent for a CT scan at [facility] to rule out a kidney  stone.  The patient reports a present UTI, and CT scan results were sent  with the patient suggesting cystitis.  She reports that she was recently  treated for a urinary tract infection with Macrobid, which she started  on June 29, 2007, for seven days.  She was started on the Macrobid  prior to results from the urinary culture.  The patient denied headache,  swelling, shortness of breath, lower extremity pain, cough, diarrhea,  vomiting.  She did report dizziness.   ALLERGIES:  1. ERYTHROMYCIN CAUSING SEIZURES.  2. KETEK CAUSING RASH.  3. CODEINE CAUSING HALLUCINATIONS.  4. LEVAQUIN, HALLUCINATIONS.  5. OXYCONTIN.  She did not report the sensitivity reaction.  6. SHELLFISH.  She did not report the sensitivity reaction.  7. She reports that she CANNOT TAKE CIPROFLOXACIN SECONDARY TO HER MS.   PAST MEDICAL HISTORY:  1. Multiple sclerosis.  2. GERD.  3. Lower extremity deep vein thrombosis in the past.  4. Celiac sprue.  5. Chronic hypertension.   PAST SURGICAL HISTORY:  1. Laparoscopic appendectomy.  2. Laparoscopic cholecystectomy.  3. Tonsils and adenoids.  4. D&C hysteroscopy.  5. Endometrial ablation.  6. Shoulder surgery.   CURRENT MEDICATIONS:  HCTZ 25 mg p.o. daily, a potassium supplement, she  thinks that it was her recall called Klor-Con, Prevacid daily, Vicodin  p.r.n., and ibuprofen p.r.n.   OBSTETRIC HISTORY:  She has had a vaginal delivery x1.   PHYSICAL EXAMINATION:  VITAL SIGNS:  Temperature on arrival was 99.1 at  approximately 1820.  At approximately 1952 this evening, it was 100.  Those were both orally.  Her blood pressure was 128/67, heart rate 86,  and respirations were 18.  GENERAL:  A patient with guarded abdomen, she was alert and oriented x4,  and had notable discomfort.  SKIN:  Warm and dry, good skin turgor.  HEENT:  Mucous membranes were pink and moist.  Within  normal limits and  grossly intact.  CARDIOVASCULAR:  Regular rate and rhythm without murmur.  LUNGS:  Clear to auscultation bilaterally.  ABDOMEN:  She had a lower abdominal incision with intact Steri-Strips.  There was some old, dried, dark-brown drainage, no redness or induration  above or below the site.  She did have a healing laparoscopic incision  at her umbilicus, which did have a small amount of kind of a yellow  exudate with a slight amount of odor but was intact.  She was tender to  touch especially in her lower quadrants and especially in the suprapubic  area.  No rebound tenderness.  EXTREMITIES:  No edema, negative Homan's sign, DTRs were 2+, and no  clonus.   Her CBC showed a white count of 13.5, hemoglobin 14.1, platelets were  slightly elevated at 410.  The differential was showing absolute  granulocyte equal to 8.9 that was high.  Reviewed the urine culture  from  December 22nd showing a positive Klebsiella pneumoniae.  Colony count  was greater than 100,000.  The sensitivities showed sensitive to  Augmentin and was resistant to Ampicillin.   ASSESSMENT:  1. Acute lower abdominal and back pain x3 days.  2. Fever with temperature max of 101 with an elevated white blood      count.  3. Status post total abdominal hysterectomy on June 22, 2007.  4. Acute versus unresolved complicated cystitis.  5. History of multiple sclerosis.  6. History of hypertension.   PLAN:  Consulted with Dr. Stefano Gaul regarding patient's CBC status and  complaints.  The patient was given Toradol 60 mg IM x1.  Per Dr.  Stefano Gaul, the patient was offered the options of:  a.  To receive Rocephin IM x1 and be discharged home and to begin Septra-  DS one p.o. b.i.d. x7 days and follow up in the office on Monday the  5th.  b.  Be admitted for a 23-hour OBS and receive Rocephin IV q.24, have IV  fluids overnight, and repeat her CBC in the morning.   After discussion with patient, the patient opted to elect for overnight  stay and reassess her CBC in the morning, admitted for a 23-hour OBS and  planned consult p.r.n.  The patient's admission orders were for vital  signs q.8h, bedrest with bathroom privileges, and a regular diet.  She  is to have a peripheral IV started, and to have LR at 125  ml per hour, she is to receive Rocephin 1 g IV q.24h, and she is to have  a repeat CBC with diff in the a.m., July 11, 2007.  Other  medications, the patient may have Dilaudid  2 to 4 mg IV q.4-6h p.r.n.  pain as well as Phenergan 12.5 mg IV q.4-6h p.r.n. nausea and vomiting.      Candice Denny Levy, [place]      Janine Limbo, M.D.  Electronically Signed    CHS/MEDQ  D:  07/10/2007  T:  07/11/2007  Job:  [id number]

## 2010-11-20 NOTE — Op Note (Signed)
[name], [name]               ACCOUNT NO.:  [id number]   MEDICAL RECORD NO.:  [id number]          PATIENT TYPE:  AMB   LOCATION:  SDC                           [facility]   PHYSICIAN:  Naima A. Dillard, M.D. DATE OF BIRTH:  04/26/1966   DATE OF PROCEDURE:  06/22/2007  DATE OF DISCHARGE:                               OPERATIVE REPORT   PREOPERATIVE DIAGNOSES:  1. Menorrhagia.  2. Perimenopause.  3. Premenstrual syndrome.  4. Dysmenorrhea.   POSTOPERATIVE DIAGNOSES:  1. Menorrhagia.  2. Perimenopause.  3. Premenstrual syndrome.  4. Dysmenorrhea.   PROCEDURES:  1. Laparoscopy.  2. Total abdominal hysterectomy, bilateral salpingo-oophorectomy.  3. Cystoscopy.   SURGEON:  Naima A. Normand Sloop, M.D.   ASSISTANT:  Osborn Coho, M.D.   ANESTHESIA:  General.   FINDINGS:  Omentum that was stuck to the anterior abdominal wall.  Normal-appearing uterus, tubes and ovaries.   SPECIMEN:  Uterus, tubes and ovaries.  Disposition was to pathology.   ESTIMATED BLOOD LOSS:  100 mL.   URINE OUTPUT:  200 mL.   COMPLICATIONS:  None.   The patient went to PACU in stable condition.  Before the case the  patient has decided that she definitely wants to have her ovaries  removed.  We discussed the [facility] and menopausal  symptoms and her contraindication to estrogen because of her history of  DVT.  The patient understands this and stated that she wants her ovaries  removed.  She understands the risk of osteoporosis and menopausal  symptoms and she still wants to proceed.   The patient was taken to the operating room, where she was given general  anesthesia, placed in dorsal lithotomy position, and prepped and draped  in a normal sterile fashion.  A Foley was placed into the bladder and a  weighted speculum was placed into the posterior fourchette of the vagina  and a skinny Deaver was placed into the anterior aspect of the vagina.  The cervix was grasped with a  single-tooth tenaculum and sounded to 9  cm.  The RUMI manipulator was then put together and placed without  difficulty.  Attention was then turned to the umbilicus, where a 10-mm  infraumbilical incision was made in the infraumbilical fold and carried  down to the fascia.  The fascia was incised in the midline, extended,  and she had omentum just all around that area.  It was all plastered to  the abdominal wall.  There was no opening.  I even put the scope in to  see if there was an opening and we could go through the omentum and  nothing was seen, so the TLH was aborted.  The RUMI was removed from the  vagina and a Pfannenstiel skin incision was made with the scalpel and  carried down to the fascia.  The fascia was incised in the midline,  extended bilaterally with the Mayo scissors.  Kochers x2 were placed on  the superior aspect of the fascia.  It dissected off the rectus muscle  both sharply and bluntly.  The inferior aspect of the fascia  was  dissected in a similar fashion.  The muscles were separated in the  midline and the peritoneum was identified, tented up and entered sharply  and extended superiorly and inferiorly with good visualization of bowel  and bladder.  The Balfour retractor was placed after the bowel was  packed away with moist laparotomy sponges.  I could not see the liver  but upon feeling in the area above the port,  you could see all the  omentum just stuck to the abdominal wall.  Attention was then turned to  the uterus, were both cornua were grasped with Kelly clamps.  Both round  ligaments were grasped, cut and cauterized.  The vesicouterine  peritoneum was entered sharply with Metzenbaum scissors and extended.  The bladder flap was created with a sponge on a stick.  After finding  ureters on both sides, the infundibulopelvic ligaments were clamped, cut  and ligated with a free tie and then suture-ligated.  Hemostasis was  assured.  The uterine arteries were  skeletonized, clamped, cut and  suture-ligated.  Hemostasis was assured.  The cardinal ligaments were  clamped, cut and suture-ligated.  Hemostasis was assured.  The  uterosacral ligaments were clamped, cut and ligated.  Hemostasis was  assured.  The cervix was removed with Mayo scissors.  The cuff was  closed using 0 Vicryl.  Irrigation was done and there was some bleeding  along one of the right pedicles.  This was made hemostatic by putting a  figure-of-eight above the suture.  Hemostasis was assured.  Irrigation  was done.  Everything was noted to be hemostatic.  All sponges and  retractors were removed from the abdomen.  The peritoneum was closed  using 0 chromic.  The muscles were irrigated and made hemostatic.  The  subcu tissue was irrigated and made hemostatic with Bovie cautery and  reapproximated with 2-0 plain.  The skin was reapproximated in a  subcuticular stitch with 3-0 Monocryl.  Sponge, lap and needle counts  were correct.  The patient went to the recovery room in stable  condition.  The Foley catheter was removed and a cystoscopy was  performed.  Both ureters efflux without difficulty and the bladder had  great integrity with no holes in it.  The cystoscope was removed.  The  Foley catheter was replaced.  Sponge, lap and needle counts were  correct, the bivalve speculum, and looked at the cuff, which was  hemostatic.  Sponge, lap and needle counts were correct.  The patient  went to the recovery room in stable condition.      Naima A. Normand Sloop, M.D.  Electronically Signed     NAD/MEDQ  D:  06/22/2007  T:  06/23/2007  Job:  [id number]

## 2010-11-20 NOTE — H&P (Signed)
[name], [name]               ACCOUNT NO.:  [id number]   MEDICAL RECORD NO.:  [id number]          PATIENT TYPE:  AMB   LOCATION:  SDC                           [facility]   PHYSICIAN:  Naima A. Dillard, M.D. DATE OF BIRTH:  [birth year]   DATE OF ADMISSION:  DATE OF DISCHARGE:                              HISTORY & PHYSICAL   CHIEF COMPLAINT:  Irregular vaginal bleeding and PMS, failed endometrial  ablation.   HISTORY OF PRESENT ILLNESS:  The patient is a 45 year old African-  American female who is status post D&C, hysteroscopy, and ablation with  polypectomy, one done in February and one done in May.  Her pathology  was found to be benign.  After ablation, she still continued to have  irregular vaginal bleeding and spotting.  The patient denies any history  of a bleeding disorder.  She said that right before her periods would  come, she would have severe mood swings and hot flashes from vaginal  dryness.  She did have FSH which was found to be normal.  She has no  fevers or chills.  She is unable to take birth control pills because of  questionable history of DVT and now wants to proceed with a hysterectomy  and removal of her ovaries.  I discussed with the patient in detail the  removal of her ovaries would put her in menopause and because of her  history of DVT she would be contraindicated to take any hormone therapy.  The [facility] was also reviewed with the patient and she  still desires this and I will review this with her tomorrow.   ALLERGIES:  LEVAQUIN, KETAC, CODEINE, SHELLFISH.   MEDICATIONS:  1. Prevacid.  2. Hydrochlorothiazide.  3. Ibuprofen.   PAST MEDICAL HISTORY:  Significant for multiple sclerosis, celiac sprue,  GERD, and lower extremity DVT.   PAST SURGICAL HISTORY:  Significant for laparoscopic appendectomy,  laparoscopic cholecystectomy, tonsils and adenoids at age 61, D&C  hysteroscopy, and endometrial ablation.   GYN HISTORY:   Negative for GYN cancer and as above.   SOCIAL HISTORY:  Negative for tobacco, alcohol, or drug use.   PAST OB HISTORY:  Significant for vaginal delivery x1.   REVIEW OF SYSTEMS:  GASTROINTESTINAL:  Significant for celiac sprue.  NEUROLOGY:  Significant for multiple sclerosis.  GENITOURINARY:  Significant for abnormal uterine bleeding and PMS.  RESPIRATORY:  No  history of asthma.  PSYCHIATRIC:  No history of mood disorder.   PHYSICAL EXAMINATION:  VITAL SIGNS:  She is afebrile with stable vital  signs.  GENERAL:  She is a well-developed, well-nourished female in no acute  distress.  HEENT:  Head is normocephalic and atraumatic.  NECK:  Free range of motion and supple.  LUNGS:  Clear to auscultation bilaterally.  HEART:  Regular rate and rhythm.  ABDOMEN:  Nondistended, soft, and nontender.  There are no palpable  nodes.  PELVIC:  External genitalia is within normal limits.  The vulva and  vagina are within normal limits.  Cervix is nontender without any  lesions.  The uterus was top normal  size.  Adnexa has no masses  bilaterally and nontender bilaterally.  EXTREMITIES:  No cyanosis, clubbing, or edema.  SKIN:  Moist mucous membranes.   On her last D&C hysteroscopy the pathology was benign.  On ultrasound,  her uterus measures 8 cm with normal ovaries.   ASSESSMENT:  Irregular vaginal bleeding and PMS.   PLAN:  Total laparoscopic hysterectomy.  The patient understands the  risks are but not limited to bleeding, infection, damage to internal  organs such as bowel, bladder, and major blood vessels.  The patient has  signed the consent and understands full well what a hysterectomy is.  She understands that it may have to convert to abdominal hysterectomy  especially with her history of severe adhesions during laparoscopy in  the past.  The patient also desires to have her ovaries removed as I  talked about before.  The [facility] was reviewed with  her and she  also understands that she cannot go on hormone replacement  therapy.      Naima A. Normand Sloop, M.D.  Electronically Signed     NAD/MEDQ  D:  06/21/2007  T:  06/22/2007  Job:  [id number]

## 2010-11-20 NOTE — Op Note (Signed)
[name], [name]               ACCOUNT NO.:  [id number]   MEDICAL RECORD NO.:  [id number]          PATIENT TYPE:  AMB   LOCATION:  SDC                           [facility]   PHYSICIAN:  Naima A. Dillard, M.D. DATE OF BIRTH:  June 15, 1966   DATE OF PROCEDURE:  11/26/2006  DATE OF DISCHARGE:                               OPERATIVE REPORT   PREOPERATIVE DIAGNOSIS:  Irregular bleeding.   POSTOPERATIVE DIAGNOSIS:  Irregular bleeding.   PROCEDURE PERFORMED:  Dilatation and curettage, hysteroscopy,  ThermaChoice ablation.   SURGEON:  Naima A. Dillard, M.D.   ASSISTANT:  None.   ANESTHESIA:  General laryngeal mask airway.   SPECIMENS:  Endometrial curettings and questionable endometrial polyp.   COMPLICATIONS:  None.   ESTIMATED BLOOD LOSS:  Minimal.   DEFICIT:  35.   DISPOSITION:  The patient went to the PACU in stable condition.   PROCEDURE IN DETAIL:  The patient was taken to the operating room where  she was given general anesthesia, placed in the dorsal lithotomy  position, prepped and draped in a normal sterile fashion.  A Foley  catheter was used to drain the bladder.  The patient was noted to have  an anteverted uterus with no adnexal masses.  A bivalve speculum was  placed in the vagina.  The anterior lip of the cervix was grasped with a  single tooth tenaculum.  The uterus did sound to 9 cm.  The cervix was  dilated with Pratt dilators up to 21.  The hysteroscope was placed into  the uterine cavity and there was still an abundant amount of endometrium  seen and a questionable area where there was a polyp.  Polyp forceps  were placed in and the area was removed and I could not tell if it was  just endometrium or a polyp.  Then, a sharp curetting was done and sent  to pathology.  A second look was done with the hysteroscope and what was  a question of a polyp was removed.  The hysteroscope was then removed  and the ThermaChoice balloon was primed, placed into the  uterine cavity  to 9 cm, and then just moved back 0.5 cm, and the ThermaChoice was done  without difficulty.  After the ThermaChoice was done per protocol, the  hysteroscope was placed back in the  uterine cavity and the uterus had been successfully ablated.  There were  no pink areas noted.  All instruments were removed fro the vagina.  The  tenaculum site was made hemostatic with pressure and silver nitrate.  Sponge, lap, and needle counts were correct.  The patient went to the  recovery room in stable condition.      Naima A. Normand Sloop, M.D.  Electronically Signed     NAD/MEDQ  D:  11/26/2006  T:  11/26/2006  Job:  [id number]

## 2010-11-20 NOTE — Discharge Summary (Signed)
[name], [name]               ACCOUNT NO.:  [id number]   MEDICAL RECORD NO.:  [id number]          PATIENT TYPE:  INP   LOCATION:  9303                          [facility]ILITY:  [facility]   PHYSICIAN:  Janine Limbo, M.D.DATE OF BIRTH:  10-Jan-1966   DATE OF ADMISSION:  07/10/2007  DATE OF DISCHARGE:  07/12/2007                               DISCHARGE SUMMARY   ADMISSION DIAGNOSES:  1. Status post total abdominal hysterectomy on June 22, 2007.  2. Abdominal and back pain.  3. Questionable urinary tract infection.  4. Questionable kidney stone.  5. Multiple sclerosis.  6. Hypertension.   DISCHARGE DIAGNOSES:  1. Status post total abdominal hysterectomy on June 22, 2007.  2. Abdominal and back pain.  3. Questionable urinary tract infection.  4. Questionable kidney stone.  5. Multiple sclerosis.  6. Hypertension.   PROCEDURE:  None this admission.   HISTORY OF PRESENT ILLNESS:  [name] is a 45 year old female who had a  total abdominal hysterectomy on June 22, 2007 because of  dysmenorrhea and menorrhagia.  Operative findings included adhesions.  The patient reports that 3 days prior to this admission she began having  severe abdominal pain.  Please see her dictated history and physical  exam for details.   On admission physical exam  the patient was noted to have tenderness in  the lower abdomen.  Her incision was well-healed.  Guarding was noted in  the abdomen.   HOSPITAL COURSE:  The patient had a pelvic and abdominal CT scan  performed.  No abscesses were noted.  There were some changes consistent  with a urinary tract infection at the level of the bladder.  This also  could have been from postoperative changes, however.  There was some  shadowing at the right ureter, but this was not totally consistent with  a kidney stone.  There was no hydroureter noted.  The patient had  previously been noted to have a Klebsiella pneumoniae urinary tract  infection and  that was appropriately treated.  Her UA on admission  looked like another UTI.  Her admission hemoglobin was 12.1.  Her  admission white blood cell count was 13,100.  She was afebrile, however.  She was quite uncomfortable.  The decision was made to admit the patient  for supportive care given the elevated white blood cell count.  She was  started on Rocephin and she was given Toradol.  Over the following 2  days, the patient steadily improved and was felt to be ready for  discharge on July 12, 2007.   DISCHARGE MEDICATIONS:  1. Ibuprofen 100 mg every 8 hours as needed for mild to moderate pain.  2. Vicodin one or 2 tablets every 4 hours as needed for severe pain.  3. Zofran 8 mg every 8 hours as needed for nausea.  4. Septra DS 1 tablet twice each day for additional 5 days.  5. The patient will continue on her preoperative medications.   DISCHARGE INSTRUCTIONS:  The patient will return to see Dr. Normand Sloop in 1  week.  She will call for questions or concerns.  She will hydrate  vigorously.      Janine Limbo, M.D.  Electronically Signed     AVS/MEDQ  D:  07/12/2007  T:  07/12/2007  Job:  [id number]

## 2010-11-20 NOTE — H&P (Signed)
[name], [name]               ACCOUNT NO.:  [id number]   MEDICAL RECORD NO.:  [id number]          PATIENT TYPE:  AMB   LOCATION:  SDC                           [facility]   PHYSICIAN:  Naima A. Dillard, M.D. DATE OF BIRTH:  1966/01/31   DATE OF ADMISSION:  11/26/2006  DATE OF DISCHARGE:                              HISTORY & PHYSICAL   CHIEF COMPLAINT:  Irregular vaginal bleeding.   HISTORY OF PRESENT ILLNESS:  The patient is a 45 year old female status  post D&C hysteroscopy on September 05, 2006.  She had a D&C,  hysteroscopy, and polypectomy, her pathology was benign.  She did well  for several days but since that time has had irregular bleeding and  spotting.  No fever, no chills.  She is unable to take birth control  pills because of questionable history of DVT and now has asked to  proceed with endometrial ablation.   ALLERGIES:  Her allergens include LEVAQUIN, CODEINE, AND SHELLFISH.   MEDICATIONS:  1. Prevacid.  2. Hydrochlorothiazide.  3. Motrin.   PAST MEDICAL HISTORY:  Her past medical history is significant for MS,  celiac sprue, GERD, lower extremity DVT.   PAST SURGICAL HISTORY:  Her past surgical history is significant for  laparoscopic appendectomy three years ago, laparoscopic cholecystectomy  five years ago, tonsils and adenoids at age 37.   GYNECOLOGIC HISTORY:  Negative for GYN cancer.   REVIEW OF SYSTEMS:  On review of systems GI is significant for celiac  sprue.  NEUROLOGIC:  Significant for multiple sclerosis.  GENITOURINARY:  Significant for intrauterine bleeding.  RESPIRATORY:  Unremarkable.  PSYCHIATRIC:  Unremarkable.   PHYSICAL EXAMINATION:  VITAL SIGNS:  Her temperature is 97.8, blood  pressure is 110/70.  GENERAL:  She is a well-developed, well-nourished female in no apparent  distress.  HEAD:  Normocephalic and atraumatic.  NECK:  Has free range of motion.  LUNGS:  Clear to auscultation bilaterally.  HEART:  Has a regular rate and  rhythm.  ABDOMEN:  Nondistended, soft, and non-tender.  There are no palpable  nodes.  GENITALIA:  External genitalia are within normal limits.  The vagina is  within normal limits.  The cervix is non-tender without any lesions.  The uterus is top normal size.  The adnexa has no masses bilaterally and  non-tender bilaterally.  EXTREMITIES:  Show no cyanosis, clubbing, or edema.  SKIN:  Has moist mucous membranes.   ASSESSMENT:  Irregular vaginal bleeding.   PLAN:  Dilatation and curettage, hysteroscopy, ThermaChoice ablation.  The patient was explained the risks of the procedure but not limited to  bleeding, infection, damage to internal organs such as bowel, bladder,  and major blood vessels.  We will also give her 5,000 units of heparin  due to her history of DVT.      Naima A. Normand Sloop, M.D.  Electronically Signed     NAD/MEDQ  D:  11/26/2006  T:  11/26/2006  Job:  [id number]

## 2010-11-20 NOTE — Discharge Summary (Signed)
[name], [name]               ACCOUNT NO.:  [id number]   MEDICAL RECORD NO.:  [id number]          PATIENT TYPE:  INP   LOCATION:  9316                          FACILITY:  [facility]   PHYSICIAN:  Naima A. Dillard, M.D. DATE OF BIRTH:  [birth year]   DATE OF ADMISSION:  06/22/2007  DATE OF DISCHARGE:  06/24/2007                               DISCHARGE SUMMARY   HISTORY:  The patient underwent a total abdominal hysterectomy and  bilateral salpingo-oophorectomy on June 22, 2007.   DISCHARGE MEDICATIONS:  1. Colace 100 mg twice a day.  2. Ibuprofen 600 mg every 6 hours if needed.  3. Reglan 10 mg every 6 hours with food.  4. Vicodin 1-2 tabs every 6 hours as needed for pain.  5. Dulcolax tabs until BMs are regular 1 every day.  6. Dilaudid every 3-4 hours as needed for pain.  7. Allegra D as needed for congestion.   FOLLOW UP:  The patient will follow up in 6 weeks.   CONDITION ON DISCHARGE:  She has been discharged in stable condition.   HOSPITAL COURSE:  The patient underwent a TAH/BSO on June 22, 2007.  On postoperative day number 0, the patient was afebrile with stable  vital signs and doing well.  On postoperative day number 2, she was  eating a regular diet, but had not had any flatus.  Her exam was benign.  On postoperative day number 2, the patient had not any nausea, vomiting,  but did have some mild distention.  She was given a Dulcolax suppository  which did give her some relief.  Her incision was found to be clean, dry  and intact.  Heart is regular rate and rhythm.  Lungs are clear.   The patient is postoperative day number 2 status post open laparoscopy,  total abdominal hysterectomy, bilateral salpingo-oophorectomy  cystoscopy.  She has truly benefitted from her hospital stay and will be  discharged home.  She has been given strict discharge instructions both  written and oral.  She was discharged in stable condition.   LABORATORY DATA:  Postoperative  hemoglobin was 12.8, platelets 208, BUN  1, creatinine 0.57.   FOLLOW UP:  I will see her back in 6 weeks.      Naima A. Normand Sloop, M.D.  Electronically Signed     NAD/MEDQ  D:  06/24/2007  T:  06/24/2007  Job:  [id number]

## 2010-11-23 ENCOUNTER — Encounter (HOSPITAL_BASED_OUTPATIENT_CLINIC_OR_DEPARTMENT_OTHER)
Admission: RE | Admit: 2010-11-23 | Discharge: 2010-11-23 | Disposition: A | Discharge status: Home / Self Care | Payer: Medicare Other | Source: Ambulatory Visit | Attending: Orthopedic Surgery | Admitting: Orthopedic Surgery

## 2010-11-23 LAB — BASIC METABOLIC PANEL
CO2: 27 mEq/L (ref 19–32)
GFR calc non Af Amer: >60 mL/min (ref >60)
Glucose, Bld: 84 mg/dL (ref 70–99)
Potassium: 4 mEq/L (ref 3.5–5.1)
Sodium: 141 mEq/L (ref 135–145)

## 2010-11-23 NOTE — H&P (Signed)
NAMECHENEY, GOSCH               ACCOUNT NO.:  [id number]   MEDICAL RECORD NO.:  [id number]          PATIENT TYPE:  AMB   LOCATION:                                [facility]  [facility]   PHYSICIAN:  Naima A. Dillard, M.D. DATE OF BIRTH:  06/16/2005   DATE OF ADMISSION:  09/05/2006  DATE OF DISCHARGE:                              HISTORY & PHYSICAL   HISTORY:  Patient is a 45 year old female who presented to me on August 04, 2006 complaining of irregular vaginal bleeding since October 2007.  Menses lasted two weeks, then in December menses lasted 30 days (she  used a pack of pads in 2 or 3 days).  She also has dysmenorrhea which  she rates as about a 5/10.  No shortness of breath, no chest pain, and  no bleeding disorder.   MEDICATIONS INCLUDE:  Prevacid and hydrochlorothiazide.   PAST MEDICAL HISTORY:  Significant for multiple sclerosis, celiac sprue,  GERD, and history of a right lower extremity DVT.  The patient denies  any alcohol, tobacco or drug use.   FAMILY HISTORY:  The patient does not have any GYN cancer.   SURGICAL HISTORY:  Significant for a laparoscopic appendectomy 3 years  ago and a laparoscopic cholecystectomy 5 years ago.  Also had tonsils  and adenoids removed at age 50.   REVIEW OF SYSTEMS:  The patient denies any heart palpitations.  The  patient denies any history of asthma.  MUSCULOSKELETAL AND NEUROLOGIC:  She has a history of multiple sclerosis.  GASTROINTESTINAL: She has a  history of GERD and celiac sprue.  GENITOURINARY:  As above.  The  patient had an ultrasound which showed the uterus measuring 8.14 x 5.17  x 6.16 with thickened endometrium and a questionable 1 cm hypoechoic  mass.  Her endometrial biopsy was found to be benign.   ASSESSMENT:  The patient was offered a sonohystogram or a D&C  hysteroscopy with ablation, polypectomy, or removal of endometrial mass.  The patient decided on just a D&C hysteroscopy.  She declined an  ablation at this  time.  The risks were limited to bleeding, infection,  perforation of the uterus.  The patient also will be given heparin  because of her history of a DVT for prophylaxis.      Naima A. Normand Sloop, M.D.  Electronically Signed     NAD/MEDQ  D:  09/03/2006  T:  09/03/2006  Job:  [id number]

## 2010-11-23 NOTE — Op Note (Signed)
[name], [name]               ACCOUNT NO.:  [id number]   MEDICAL RECORD NO.:  [id number]          PATIENT TYPE:  AMB   LOCATION:  SDC                           FACILITY:  [facility]   PHYSICIAN:  Naima A. Dillard, M.D. DATE OF BIRTH:  [birth year]   DATE OF PROCEDURE:  09/05/2006  DATE OF DISCHARGE:                               OPERATIVE REPORT   PREOPERATIVE DIAGNOSIS:  Endometrial mass.   POSTOPERATIVE DIAGNOSIS:  Endometrial polyps.   PROCEDURE:  D&C, hysteroscopy, resection of endometrial polyps.   SURGEON:  Naima A. Dillard, M.D.   ASSISTANT:  None.   ANESTHESIA:  General.   SPECIMENS:  Endometrial curettings, endometrial polyps.   ESTIMATED BLOOD LOSS:  Minimal.   DEFICIT:  200 mL of sorbitol.   COMPLICATIONS:  None.   DISPOSITION:  The patient went to the PACU in stable condition.   DESCRIPTION OF PROCEDURE:  The patient was taken to the operating room  where she was given general anesthesia and prepped and draped in the  normal sterile fashion.  The bladder was drained.  Under the anesthesia,  the patient had about an 8-week size uterus with no adnexal masses.   A bivalve speculum was placed into the vagina.  The anterior lip of the  cervix was grasped with a single-tooth tenaculum.  The uterus did sound  to 9 cm.  The cervix was further dilated with Pratt dilators up to 21.  The hysteroscope was placed into the uterine cavity.  Two large polyps  were seen on the anterior wall of the uterus.  Both ostia were  visualized, and there was fluffy abundant endometrium noted.  The  hysteroscope was removed.  The polyp forceps were placed into the  uterine cavity, and some of the polyp was resected, and I then put the  hysteroscope back in.  Some polyps remained, so I decided to dilate with  the [facility] dilators up to 31.  The resectoscope was then placed.  The  single-loop resectoscope was used to remove the polyps in entirety.  The  single-loop resectoscope was then  removed.  A sharp curettage was done.  A moderate amount of endometrial curettings were obtained.  Both the  endometrial polyps and endometrial curettings were sent to pathology.  A  small hysteroscope was placed into the uterine cavity, and again, both  ostia were visualized and the polyps removed in  their entirety.  All instruments were removed from the vagina.  The  tenaculum was removed, with good hemostasis noted at the cervix.  Sponge, lap, and needle counts were correct.   The patient went to the recovery room in stable condition.      Naima A. Normand Sloop, M.D.  Electronically Signed     NAD/MEDQ  D:  09/05/2006  T:  09/05/2006  Job:  [id number]

## 2010-11-23 NOTE — Op Note (Signed)
   [name], [name]                         ACCOUNT NO.:  [id number]   MEDICAL RECORD NO.:  [id number]                   PATIENT TYPE:  AMB   LOCATION:  ENDO                                 FACILITY:  [facility]   PHYSICIAN:  Anselmo Rod, M.D.               DATE OF BIRTH:  1965-12-29   DATE OF PROCEDURE:  10/25/2002  DATE OF DISCHARGE:                                 OPERATIVE REPORT   PROCEDURE PERFORMED:  Esophagogastroduodenoscopy.   ENDOSCOPIST:  Anselmo Rod, M.D.   INSTRUMENT USED:  Olympus video panendoscope.   INDICATIONS FOR PROCEDURE:  Epigastric pain with recent rectal bleeding in a  45 year old white female.  Rule out ulcer disease, evidence of IDD, etc.   PREPROCEDURE PREPARATION:  Informed consent was procured from the patient.  The patient was fasted for eight hours prior to the procedure.   PREPROCEDURE PHYSICAL:  VITAL SIGNS: The patient had stable vital signs.  NECK: Supple.  CHEST: Clear to auscultation.  S1 and S2 regular.  ABDOMEN: Soft with normal bowel sounds.   DESCRIPTION OF PROCEDURE:  The patient was placed in the left lateral  decubitus position, sedated with 60 mg of Demerol and 8 mg of Versed  intravenously.  Once the patient was adequately sedated and maintained on  low flow oxygen and continuous cardiac monitoring, the Olympus video  panendoscope was advanced through the mouth piece, over the tongue, into the  esophagus and under direct vision.  The entire esophagus appeared normal  with no evidence of rings, strictures, masses or esophagitis or Barrett's  mucosa.  The scope was then advanced into the stomach.  There was evidence  of mild diffuse gastritis with a few areas of erythema and old heme  overlying these areas.  No ulcers or masses were seen.  The duodenal bulb  and small bowel up to 60 cm appeared normal.  There was no evidence of  obstruction.    IMPRESSION:  1. Essentially normal-appearing esophagus and proximal small  bowel.  2. Mild diffuse gastritis.   RECOMMENDATIONS:  Proceed with a colonoscopy at this time.                                                 Anselmo Rod, M.D.    JNM/MEDQ  D:  10/25/2002  T:  10/25/2002  Job:  [id number]   cc:   Candyce Churn. Allyne Gee, M.D.  [address]  [address]  [place]  [place] [address]  Fax: [phone]

## 2010-11-23 NOTE — H&P (Signed)
[name], [name]                         ACCOUNT NO.:  [id number]   MEDICAL RECORD NO.:  [id number]                   PATIENT TYPE:  INP   LOCATION:  0104                                 FACILITY:  [facility]   PHYSICIAN:  Anselm Pancoast. Zachery Dakins, M.D.          DATE OF BIRTH:  Jun 06, 1966   DATE OF ADMISSION:  07/27/2003  DATE OF DISCHARGE:                                HISTORY & PHYSICAL   CHIEF COMPLAINT:  Abdominal pain in the right lower quadrant.   HISTORY:  [name] is a 45 year old Caucasian female who presented to  [facility] this evening with a two day history of progressive waves of  abdominal pain that shifted to the right lower quadrant.  She was seen by  Dr. Georgeanne Nim and had a temperature of 101, was definitely tender in the right  lower quadrant. He did an abdominal, rectal, and pelvic exam and finding no  other definite abnormalities. Laboratory studies were performed which showed  an elevated white count of 13,000 with a mild left shift and then a  urinalysis was unremarkable with negative blood and just trace white cells.  Plain abdominal films were unremarkable and he clinically thought she had  acute appendicitis. She was referred to the ER and on questioning she states  that two years ago she was diagnosed with multiple sclerosis. She has had  episodes of weakness, but more recently she __________  using a wheelchair  or essentially normal activities. She had never had this previous abdominal  pain. Her menstrual cycles are regular and nothing like this when she has  her cycle.   On physical exam, I was also impressed she was definitely tender in the  right lower quadrant and also guarding in the __________  quadrants. She has  had a previous ruptured gallbladder and that surgery was done when she lived  in [place] two to three years ago.  I discussed with her that most likely  clinically this was acute appendicitis and we would proceed with a  laparoscopic  appendectomy.  Did not think that a CAT scan of the abdomen was  needed as epigastric pain had shifted to right lower quadrant __________  patient essentially __________  .   PAST MEDICAL HISTORY:  She has had a laparoscopic cholecystectomy. She is  treated by Dr. Sandria Manly for her multiple sclerosis. She states she is allergic  to __________  , and she has one child, age 42.   PHYSICAL EXAMINATION:  GENERAL: She is a pleasant, slightly overweight  Caucasian female who appears uncomfortable.  VITAL SIGNS: Temperature is 98.9, pulse 105, respirations 24, blood pressure  normotensive.  HEENT: Unremarkable.  LUNGS: Clear.  BREASTS: Negative.  CARDIAC: Normal sinus rhythm.  ABDOMEN: She is definitely tender in the lower right quadrant. I do not  appreciate any umbilical or inguinal hernias. Her pain certainly appears  localized to the right lower quadrant.  PELVIC/RECTAL:  I did  not repeat the pelvic and rectal exam that were  performed at primary care, after talking with Dr. Georgeanne Nim.  CNS: Essentially unremarkable. Apparently she has history of multiple  sclerosis that she says she has __________  remission __________  .   ADMISSION IMPRESSION:  1. Acute appendicitis.  2. History of remote multiple sclerosis.   PLAN:  Will perform laparoscopic appendectomy. She is to receive 3 gm of  Unasyn and will be assigned for laparoscopic approach.                                               Anselm Pancoast. Zachery Dakins, M.D.    WJW/MEDQ  D:  07/27/2003  T:  07/28/2003  Job:  [id number]

## 2010-11-23 NOTE — Op Note (Signed)
[name], Jean                         ACCOUNT NO.:  [id number]   MEDICAL RECORD NO.:  [id number]                   PATIENT TYPE:  AMB   LOCATION:  ENDO                                 FACILITY:  [facility]   PHYSICIAN:  Anselmo Rod, M.D.               DATE OF BIRTH:  1966-01-09   DATE OF PROCEDURE:  DATE OF DISCHARGE:                                 OPERATIVE REPORT   PROCEDURE PERFORMED:  Colonoscopy.   ENDOSCOPIST:  Anselmo Rod, M.D.   INSTRUMENT USED:  Olympus video colonoscope.   INDICATIONS FOR PROCEDURE:  This is a 45 year old white female with a  history of change in bowel habits, rectal bleeding and red blood in stools.  Rule out inflammatory bowel disease.   PRE-PROCEDURE PREPARATION:  Informed consent was procured from the patient.  The patient was fasted for eight hours prior to the procedure and prepped  with a bottle of Maalox and Gatorade the night prior to the procedure.   PRE-PROCEDURE PHYSICAL:  VITAL SIGNS:  The patient had stable vital signs.  NECK:  Supple.  CHEST:  Clear to auscultation.  CARDIAC:  S1 and S2 are regular.  ABDOMEN:  Soft with normal bowel sounds.   DESCRIPTION OF PROCEDURE:  The patient was placed in the left lateral  decubitus position and sedated with Demerol and Versed for the  esophagogastroduodenoscopy.  No additional sedation was used for the  colonoscopy.  Once the patient was adequately sedated and maintained on low  flow oxygen and continuous cardiac monitoring, the Olympus video colonoscope  was advanced into the rectum to the cecum with difficulty.  There was a  large amount of stool in the right colon, especially the cecum.  Multiple  washes were done and preparation with this was not adequate.  There was no  evidence of inflammatory bowel disease. Small non-bleeding internal  hemorrhoids were seen on retroflexion of the rectum.  No masses or polyps  were seen.   IMPRESSION:  1. Small non-bleeding internal  hemorrhoids.  2. No masses or polyps seen.  3. No evidence of inflammatory bowel disease.  4. Large amount of residual stool in the right colon.    RECOMMENDATIONS:  1. Small bowel follow through will be done to rule out small bowel     involvement with inflammatory bowel disease and further recommendations     made on results.  2. Avoid all non-steroidals including aspirin for now.                                               Anselmo Rod, M.D.    JNM/MEDQ  D:  10/25/2002  T:  10/25/2002  Job:  [id number]   cc:   Candyce Churn. Allyne Gee, M.D.  [address]  [address]  [place]  [place] [address]  Fax: [phone]

## 2010-11-23 NOTE — Op Note (Signed)
NAMEANGELIN, [name]                         ACCOUNT NO.:  [id number]   MEDICAL RECORD NO.:  [id number]                   PATIENT TYPE:  INP   LOCATION:  0104                                 FACILITY:  [facility]   PHYSICIAN:  Anselm Pancoast. Zachery Dakins, M.D.          DATE OF BIRTH:  Oct 28, 1965   DATE OF PROCEDURE:  07/27/2003  DATE OF DISCHARGE:                                 OPERATIVE REPORT   PREOPERATIVE DIAGNOSIS:  Acute appendicitis.   POSTOPERATIVE DIAGNOSIS:  Awaiting final pathology report.   OPERATION:  Laparoscopic examination and laparoscopic appendectomy.   ANESTHESIA:  General.   SURGEON:  Anselm Pancoast. Zachery Dakins, M.D.   ASSISTANT:  Nurse.   INDICATIONS:  Anaise Sterbenz is a 45 year old, slightly overweight Caucasian  female who presented to [facility] with a two day history of abdominal pain  that had originally been periumbilical and upper and had shifted to the  right lower quadrant. She had a low grade temperature of 101, elevated white  count of 13,000 and was definitely locally tender.  I also got a urinalysis  which was unremarkable.  Plain abdominal films were negative and Dr. Dalbert Garnet  read them and thought that she had acute appendicitis.  He referred her to  me and on physical exam I was in agreement.  She was definitely tender in  the right lower quadrant and because of her epigastric pain that had shifted  and negative urine and elevated white count with a left shift, I thought it  would be best to proceed on with a laparoscopic appendectomy and not obtain  a CT.   DESCRIPTION OF PROCEDURE:  The patient was positioned on the OR table,  induction of general anesthesia, endotracheal tube, the abdomen, after a  Foley catheter had been inserted sterilely, was prepped with Betadine  solution and draped in a sterile manner. I made a small vertical incision  below the umbilicus that included approximately 2 inches of adipose tissue  and the facia was identified and  carefully opened in extended fashion.  The  posterior layer of the rectus fascia was picked up between two hemostats and  carefully opened.  There were little adhesions up to this area but I kind of  finger dissected these free and placed a pursestring suture and Hasson  cannula.  The laparoscope was inserted and was I was impressed that I could  see the appendix.  She has got a very floppy cecum but it was not as acutely  inflamed as I thought it would be.  It was definitely a little stiffer maybe  than normal and I put the upper 5 mm port in the right subcostal area and  then the 10/11 port in the left lower quadrant.  I first looked at the right  tube and ovary, pictures were taken and I did not see any evidence of a  salpingitis or blood in the pelvis, a little bit of  fluid that was cultured.  The uterus looked a little erythematous but I expect it is just normal  appearing uterus since there was no evidence of any drainage or anything  from the fallopian tubes. I then looked at the sigmoid colon and did not see  anything that looked like diverticulitis.  The cecum and ascending colon  looked unremarkable.  In the upper abdomen, there was no evidence of any  fluids or perforated ulcer or anything and I then elected to go ahead and  proceed on to the appendix.  The appendix was floppy, grasped it and then  the mesentery was divided with the right angle and it was actually separated  from the base of the appendix and then the first fire of the linear stapler  was used to divide the mesentery.  Next we got a second load and placed it  across the base of the appendix and then fired it but it did not function  properly. The appendix was not divided and the staples were not actually  truly fired but fortunately I had enough length of the appendix where I  attempted to go across and I was able to take the second stapler and go  slightly under this and then fired this and it worked  satisfactorily.  The  appendix was placed in the EndoCatch bag and brought out through the  umbilicus and I felt the appendix and it was a little firm but still I doubt  that this was acute appendicitis. The Hasson cannula was reinserted and then  I looked thoroughly in all areas again, no evidence of any bleeding where we  had removed the appendix. I ran the small bowel probably 4 or 5 feet and did  not see any evidence of Meckel's or Crohns disease or any acute  inflammation. The sigmoid colon the parts that I could see looked  unremarkable.  The cecum definitely looks unremarkable except it is kind of  floppy like maybe she has a little bit of chronic constipation.  I then  removed the lower 5 mm port, lower 10/11 and 12 mm port and then removed the  upper 5 mm port. With the irrigating fluid I then aspirated and the cultures  had been obtained. We then released the carbon dioxide and removed the  Hasson cannula. The fascia in the umbilicus I tied the pursestring and  placed a second figure-of-eight of #0 Vicryl.  I placed one stitch in the  anterior fascia left lower quadrant and then closed the subcutaneous wound  after irrigating the wound with saline, 4-0 Vicryl, Benzoin and Steri-Strips  in the skin.  I had the nurse do a sterile culture of the urine before  removing the Foley since I am not sure of the etiology of her pain and fever  and I am trying to make sure that it is not a urinary tract infection with a  normal urinalysis.  The patient awakened promptly at the completion of the  surgery and placed Marcaine in the incision at the umbilicus for  postoperative pain control.  The patient - I am going to keep her on  antibiotics for at least four days as I am awaiting the preliminary cultures  and I will repeat a white count in the morning.  Anselm Pancoast. Zachery Dakins, M.D.   WJW/MEDQ  D:  07/27/2003  T:  07/28/2003  Job:  [id number]   cc:    Dr. _______________________

## 2010-11-23 NOTE — Discharge Summary (Signed)
[name], [name]                         ACCOUNT NO.:  [id number]   MEDICAL RECORD NO.:  [id number]                   PATIENT TYPE:  INP   LOCATION:  3001                                 FACILITY:  [facility]   PHYSICIAN:  Candy Sledge, M.D.            DATE OF BIRTH:  1965/10/17   DATE OF ADMISSION:  04/24/2002  DATE OF DISCHARGE:  05/14/2002                                 DISCHARGE SUMMARY   CHIEF COMPLAINT:  Low back pain.   HISTORY OF PRESENT ILLNESS:  The patient is a 45 year old right-handed  divorced female who recently moved to this area and saw Dr. Genene Churn. Love on  February 11, 2002.  She carries a diagnosis multiple sclerosis since 1997 and  apparently at one point was quite disabled and confined to a wheelchair.  She recovered from disability, however, and has been functioning  independently, living with her son in the [place] area.  We did not have  many records pertaining to her diagnosis of MS at the time of her admission.   Ten days prior to admission, the patient twisted in her bathtub and  experienced acute low back pain and sacral pain with radiation across the  low back, left more than the right.  She has had some leg pain with  complaints of burning in the last three toes of her right foot.  She has had  some incontinence of urine in bed and no bowel incontinence.  The patient  has been treated by a chiropractor with very temporary relief for a presumed  L4-5 disk.  She presented to the emergency room on the morning of admission,  unable to manage further at home.   PAST MEDICAL HISTORY:  Past medical history is positive for hypertension,  hypothyroidism, migraine headaches, cholecystectomy in 2002, suspected  multiple sclerosis, T&A in 1987 and rotator cuff surgery in 1996.   ALLERGIES:  The patient has allergies to LEVAQUIN, CODEINE and ERYTHROMYCIN.   SOCIAL HISTORY:  Social history reveals the patient is divorced and lives  with her 39 year old  son.  She does not smoke or use alcohol.   PHYSICAL EXAMINATION:  GENERAL:  On examination, this is a well-developed,  overweight lady appearing her stated age, in distress from back pain.  Blood  pressure 117/74, pulse 95, respirations 20, temperature 99.4, going to  100.2.  Cranium was normocephalic and atraumatic.  Neck is supple and  without bruits.  Heart reveals a regular rate and rhythm without murmurs and  lungs are clear to auscultation.  The abdomen was soft, nontender, without  masses or organomegaly.  Breast, rectal and genital examinations were not  performed, not felt to be pertinent to the present admission.  In her  extremities, she was very tender to palpation over the lower back.  On  neurologic examination, mental status was normal.  Affect was flat and mood  depressed.  Cranial nerve examination was  unremarkable.  Motor testing  revealed tremulousness of the outstretched hands and feet.  She had full  strength in the upper extremities and give-away weakness in the lower  extremities.  Straight leg raising was markedly positive at 30 degrees  bilaterally with increased back pain.  Deep tendon reflexes were 2+ and  symmetric and plantar responses downgoing bilaterally.  Sensory examination  was intact to pinprick, temperature and vibratory sensation.  Cerebellar  testing showed good finger-to-nose-to-finger with end point tremor  bilaterally.   MRI of the lumbar spine was reviewed and showed no obvious abnormality.   IMPRESSION ON ADMISSION:  1. Acute low back pain -- unable to manage as an outpatient, of questionable     etiology.  Her pain sounded mechanical but there were no obvious findings     on MRI.  Question was raised if her symptoms were related to a multiple     sclerosis exacerbation.  2. Low-grade temperature, ? significance.  3. History of suspected multiple sclerosis.   PLAN:  Plan is to admit the patient, start IV Solu-Medrol and Decadron,  obtain  physical therapy consultation and consider further followup for  multiple sclerosis.   LABORATORY FINDINGS:  Admission CBC and differential showed white cell count  of 11,100, hemoglobin 14.1, hematocrit of 42.1.  Platelet count was 251,000.  There were 75% neutrophils, 16% lymphocytes and 6% monocytes.  Repeat serial  CBCs showed consistent elevation of white cell counts in the range of 24,000  to 29,000 between April 26, 2002 and May 06, 2002.  On May 09, 2002, white cell count had gone down to 15,600.  Some toxic granulations,  hypersegmented neutrophils and plasmacytoid lymphocytes were noted.  Sedimentation rate was 4 mm/hr.  Pro time was 1.2 seconds.  Comprehensive  metabolic panel showed potassium 3.3 and all other indices were within  normal limits.  Repeat basic metabolic panels showed normalization of  potassium with some elevation of glucose to the range of 175.  CK was normal  at 17 u/l.  TSH was normal at 3.770.  CSF studies obtained during a  myelogram showed 328 rbc's, 8 wbc's, with 26% neutrophils and 67%  lymphocytes.  CSF protein was 18 and glucose was 60.  Spinal fluid for  angiotensin-converting enzyme was within the reference range.  There were  positive oligoclonal bands in the CSF consistent with diagnosis of multiple  sclerosis.  The IgG index was mildly elevated at 0.72.  Urine culture on  May 10, 2002 showed 100,000 E. coli.  CSF cultures were negative.  Lyme's disease titers in the CSF were negative likewise.   Lumbar spine series on April 27, 2002 showed mild scoliosis and no  fracture or subluxation.   MRI of the thoracic spine with and without IV contrast on April 26, 2002  was unremarkable.  There was mild scoliosis.  MRI of the lumbar spine on  April 24, 2002 showed no definite abnormalities.   Nuclear whole body scan on May 03, 2002 showed no acute bony  abnormality.  Left sacroiliac injection under fluoroscopy was carried out  on April 29, 2002 and was technically successful.  Lumbar myelogram was carried out on  May 07, 2002, followed by post-myelogram CT; this showed minimal  multifactorial central canal stenosis of the mid-lower lumbar region.  There  was no evidence of neural compromise.  Repeat left SI joint injection was  carried out on May 13, 2002.   COURSE IN THE HOSPITAL:  The  patient was admitted to the neurosciences unit.  Vital signs were generally stable, although she continued to have some low-  grade temperatures during a good portion of her rather extended hospital  course.  The patient was given a three-day course of intravenous Solu-Medrol  or Decadron for treatment of a presumed MS exacerbation.  She continued to  complain of significant low back pain and had intermittent complaints of  weakness and numbness in her lower extremities.  Examinations were somewhat  variable with evidence of some give-away weakness at times.  She was able to  ambulate to the bathroom but fell on one occasion and complained of near-  syncope and/or weakness in her lower extremities on other occasions.  The  patient underwent the above workup to look for causes of her low back pain  including MRI of the thoracic spine, previous MRI of the lumbar spine,  myelogram and whole-body scan.  With pain appearing to localize over the  left SI joint, she received her first SI joint injection, which was  unsuccessful.  The patient was seen in consultation by Dr. Loraine Leriche C. Yates for  orthopedics and no discernable cause for her pain could be determined.  Dr.  Ophelia Charter agreed with a bone scan, which was negative, as noted above.  The  patient was also seen by Dr. Antonietta Breach for psychiatry, given the  possibility that depression and/or current stressor could be leading to  somatization.  Dr. Jeanie Sewer did feel the patient was undergoing a  depressive disorder, single episode, in remission, and had some element of   post-traumatic stress disorder.  Overall, she seemed to be dealing well with  her stressors.  The patient was also seen by infectious disease due to  persistently elevated white cells counts, even after coming off of steroid  medications.  She underwent cultures and other studies, which were generally  unrevealing, apart from a positive urine culture.  Her white cell count did  come down towards the latter part of her hospitalization and her low-grade  temperatures resolved as well.  CSF studies were carried out at the time of  lumbar myelogram and were again positive for oligoclonal bands consistent  with the diagnosis of multiple sclerosis.   Finally, the patient continued to complain of persistent left SI joint pain  and underwent the second joint injection.  This was entirely successful with  a rapid improvement in her pain to the point where she was able to go home the following day on May 14, 2002.  During her hospitalization, she was  treated with a variety of analgesics and other medications including  Neurontin in attempt to control her pain, with more or less success.   FINAL DIAGNOSES:  1. Left sacroiliac joint inflammation -- resolved.  2. History of multiple sclerosis -- stable.  3. Depression -- controlled.   DISCHARGE MEDICATIONS:  1. Copaxone.  2. Levoxyl 0.05 mg every day.  3. Prevacid 30 mg p.o. every day.  4. Propranolol 20 mg one p.o. b.i.d.  5. Amantadine 100 mg b.i.d.  6. Clonazepam 0.5 mg t.i.d.  7. Baclofen 10 mg p.o. t.i.d.  8. Hydrochlorothiazide 25 mg per day.  9. Dilaudid 2 mg one to two every four to six hours as needed for residual     pain.  10.      Phenergan 25 mg one every four to six hours as needed for nausea.   DISPOSITION:  The patient is discharged to home with instructions to  follow  up with Dr. Sandria Manly or Demetrio Lapping, P.A., in four to six weeks.  She was  to call for reoccurrence of pain.   CONDITION ON DISCHARGE:  Improved.   Prognosis good.                                                Candy Sledge, M.D.    JJS/MEDQ  D:  06/30/2002  T:  07/02/2002  Job:  [id number]   cc:   Genene Churn. Love, M.D.  [address]  [address]  [place]  [place] [address]  Fax: [phone]   Veverly Fells. Ophelia Charter, M.D.  [address]  [place], [place] [address]  Fax: [phone]

## 2010-11-23 NOTE — H&P (Signed)
NAMEMARSELLA, SUMAN               ACCOUNT NO.:  [id number]   MEDICAL RECORD NO.:  [id number]          PATIENT TYPE:  INP   LOCATION:  3003                         FACILITY:  [facility]   PHYSICIAN:  Deanna Artis. Hickling, M.D.DATE OF BIRTH:  January 05, 1966   DATE OF ADMISSION:  09/07/2004  DATE OF DISCHARGE:                                HISTORY & PHYSICAL   CLINICAL HISTORY:  The patient is a 45 year old with right eye pain and  blurred vision. She was seen by an optometrist today who made a diagnosis of  retrobulbar neuritis.   HISTORY OF PRESENT CONDITION:  The patient awakened this morning with  lancinating pain. She noted that the sclera of her right eye was red. She  thought she might be having a migraine, but did not develop a headache.  Later in the afternoon she developed blurred vision and sought eye care. She  called tonight around 6:30 and was advised to present for admission and  treatment with Solu-Medrol. The patient complained bitterly that her eye  hurt to move and to touch. She had no other symptoms. Last exacerbation was  in 2002 and 2003. Last optic neuritis in 1999. Diagnosis of multiple  sclerosis in 1997. She has only ever been on Copaxone which started in June  1997.  MRI showed a right parietal lesion and she has also  had some lesions  in the lower spine. The patient has had some problems with leg pain and  weakness. She has had neurogenic bladder more so than bowel and at one time  had to wear an indwelling catheter.   CURRENT MEDICATIONS:  1.  Prevacid 30 mg five daily.  2.  Neurontin 100 mg t.i.d.  3.  Hydrochlorothiazide 25 mg daily.  4.  Amantadine 100 mg b.i.d.  5.  Levoxyl 50 mcg daily.  6.  Propranolol 40 mg daily.  7.  Baclofen 10 mg t.i.d.  8.  Copaxone injections daily.   DRUG ALLERGIES:  ERYTHROMYCIN cause seizures; CODEINE causes hallucination  and itching. She is also allergic to [facility].   PAST MEDICAL HISTORY:  1.  Hypothyroidism.  2.   Swelling in her legs and pain in her legs.  3.  Fatigue.  4.  Gastroesophageal reflux disease.   PAST SURGICAL HISTORY:  1.  Appendectomy in 2005.  2.  Laparoscopic cholecystectomy in 2003.  3.  Right rotator cuff repair in 1996.  4.  Tonsillectomy and adenoidectomy in 1987.   FAMILY HISTORY:  Parents have hypertension. There is no neurologic disease  including multiple sclerosis, Parkinson disease, Alzheimer disease,  seizures, blindness, deafness, or gait disorder.   SOCIAL HISTORY:  The patient is single. She raises her son who is 51 years  of age. She is divorced. She has been on disability since August 2002. She  is a nonsmoker. She does not use alcohol or drugs.   PHYSICAL EXAMINATION:  VITAL SIGNS: Temperature 98.9, pulse 88, respirations  9, blood pressure 130/85, oxygen saturation 97%.  GENERAL: This is a short statured, mildly obese, white haired woman in no  distress.  HEENT: The  right lateral sclera is red. The right eye is tender to touch.  There are no signs of infection elsewhere. Supplement for range of motion.  No cranial or cervical bruits.  LUNGS: Clear to auscultation.  HEART: No murmurs. Pulses normal.  ABDOMEN: Soft, protuberant. Bowel sounds normal. No hepatosplenomegaly.  EXTREMITIES: Normal.  NEUROLOGIC: The patient is awake, alert, and no dysphagia. Cranial nerves  reveal round and reactive pupils. There is no afferent pupillary defect.  Visual acuity 20/50 OD, 20/20 OS. Fundi appears to be normal bilaterally.  Visual fields of the left eye was normal. The right eye seemed to show a  heteronymous field cut involving the right eye temporal field and the  inferior right nasal field to double simultaneous stimuli and the patient  definitely had decreased perception in the periphery in those three  quadrants; she could count fingers in the right eye superior nasal field.  Symmetric facial strength. Extraocular movements are full. No ptosis.  Midline tongue  and uvula. Air conduction greater than bone conduction  bilaterally. Motor examination reveals normal strength, tone, and  mass.  Fine motor movements are normal with no drift. Sensation intact to primary  cortical modalities. Cerebellar examination shows good finger-to-nose, rapid  alternating movements. Gait is normal. Reflexes were diminished. No clonus.  The patient has bilateral flexor plantar responses.   IMPRESSION:  1.  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  2.  Hypothyroidism.  3.  Gastroesophageal reflux disease.  4.  Essential tremor.   PLAN:  IV Solu-Medrol 250 mg q.6h. times five days. The patient will  continue to take her home medications. There is a possibility that a PICC  line could be placed and we could transition her to home care, but that  remains to be seen.      WHH/MEDQ  D:  09/07/2004  T:  09/08/2004  Job:  [id number]

## 2010-11-23 NOTE — Discharge Summary (Signed)
NAMEFANTASY, DONALD               ACCOUNT NO.:  [id number]   MEDICAL RECORD NO.:  [id number]          PATIENT TYPE:  INP   LOCATION:  3003                         FACILITY:  [facility]   PHYSICIAN:  Deanna Artis. Hickling, M.D.DATE OF BIRTH:  1966/03/22   DATE OF ADMISSION:  09/07/2004  DATE OF DISCHARGE:  09/11/2004                                 DISCHARGE SUMMARY   FINAL DIAGNOSES:  1.  Multiple sclerosis, relapsing/remitting type, 340.  2.  Right eye optic neuritis, with decreased visual acuity and pain.  3.  Hypothyroidism.  4.  Osteopenia.  5.  Hypertension.   PROCEDURE:  MRI scan of the brain without and with contrast.   COMPLICATIONS:  None.   SUMMARY OF THE HOSPITALIZATION:  The patient is a 45 year old woman  previously seen in our office in 2003, diagnosed with multiple sclerosis in  1997.  Last episode of optic neuritis in 1999, last exacerbation in 2003.  The patient has had problems with gait disorder and neurogenic bowel and  bladder, and had also had optic neuritis.   Her complaint involved pain in her eye that was severe, aching, involving  the entire orbit, that was present on admission, seemed to improve the first  day, and then worsened for the next couple of days.  This has responded  nicely to increasing her Neurontin and also giving her nighttime Vicodin.  The patient also experienced loss of vision in the right eye and an afferent  pupillary defect with visual acuity of about 20/50 OD, 20/20 OS.  As a  result of this, the patient was admitted to the hospital and treated with  Solu-Medrol 250 mg q.6 h. x 3 days.  During that time, the patient  experienced mild glucose intolerance which was treated with sliding scale  insulin.  She did not experience significant hypertension.   On the day of discharge, she is improved.  Her visual acuity has improved to  20/25.  Her visual fields, which showed a heteronomous altitudinal and hemi-  field defect, with sparing of  the right superior nasal quadrant.  Vision in  her right eye has improved to visual fields to double simultaneous stimuli.  As a result of this, she is improved, and further treatment with IV Solu-  Medrol is not warranted.   The patient had an MRI scan of the brain without and with contrast with a  detailed view of the orbits which showed an excellent view of the optic  nerves in the T2, FLAIR, and post-contrast studies.  There was no evidence  of inflammation of the optic nerves or the optic chiasm, nor was there any  evidence of petrous ridge inflammation abscess, tumor, or other structure  impinging upon the optic nerve.  There was a solitary lesion with cystic  change that did not enhance in the left posterior frontal region which is  identical to the lesion which she had in 2003.  No other lesions were seen  in any area of the brain and brain stem that were scanned.   EXAMINATION TODAY:  TEMPERATURE:  97.5.  BLOOD PRESSURE:  106/63.  RESTING PULSE:  58.  RESPIRATIONS:  22.  OXYGEN SATURATION:  95% on room air.  LUNGS:  Clear.  HEART:  No murmurs.  ABDOMEN:  Protuberant, soft.  Bowel sounds normal.  EXTREMITIES:  Negative.  NEUROLOGIC:  The patient was awake and alert.  Full visual fields.  Visual  acuity 20/25, 20/20 OS.  Mini afferent pupillary defect.  Symmetric facial  strength.  Midline tongue and uvula.  Air conduction greater than bone  conduction.  Motor examination:  Normal strength, tone, and mass.  Good fine  motor movements.  No pronator drift.  Sensation intact. Gait slightly broad  based but steady.  Deep tendon reflexes normal.  The patient had bilateral  flexor plantar responses.   The patient is discharged in improved condition, on the following  medications:  1.  Prednisone 20 mg, which will be tapered from 60 mg for four days, down      to 50, 40, 30, 20, and 10, at two-day intervals each.  She will then      receive prednisone 5 mg daily for four days and  discontinue prednisone.  2.  Her stomach will be covered with Prevacid 30 mg b.i.d.  She takes that      on a regular basis.  3.  She will be given Vicodin 1 tablet as needed for pain, #20, no refill.  4.  Os-Cal Plus D one daily.  This may need to increase in order to properly      care for her bones.  We will leave that to Dr. Allyne Gee.  5.  Neurontin has been increased to 100 mg one in the morning, one at      midday, two at nighttime.  We will increase it further if she continues      to have pain.  6.  Hydrochlorothiazide 25 mg daily.  7.  Amantadine 100 mg b.i.d.  8.  Levoxyl 50 mcg once daily.  9.  Propranolol 40 mg daily.  10. Baclofen 10 mg t.i.d.  11. Copaxone daily.   ACTIVITY:  As tolerated.   DIET:  Should be low salt and low carb as long as she remains on the  prednisone.   Return visit to see me and to see my nurse practitioner Darrol Angel in  three months' time.  Call [phone] on October 08, 2004 for an appointment in  June 2006.  We will see the patient sooner as needed.  She should see her  primary care doctor within one month's time.      WHH/MEDQ  D:  09/11/2004  T:  09/11/2004  Job:  [id number]   cc:   Candyce Churn. Allyne Gee, M.D.  [address]  [address]  [place]  [place] [address]  Fax: [phone]   Deanna Artis. Sharene Skeans, M.D.  [address]  [address]  [place]  [place] [address]  Fax: [phone]

## 2010-11-23 NOTE — Consult Note (Signed)
[name], [name]                         ACCOUNT NO.:  [id number]   MEDICAL RECORD NO.:  [id number]                   PATIENT TYPE:  INP   LOCATION:  3001                                 FACILITY:  [facility]   PHYSICIAN:  Mark C. Ophelia Charter, M.D.                 DATE OF BIRTH:  05/29/1966   DATE OF CONSULTATION:  04/30/2002  DATE OF DISCHARGE:                                   CONSULTATION   REASON FOR CONSULTATION:  Subacute low back pain with inability to ambulate.   HISTORY OF PRESENT ILLNESS:  This 45 year old female was admitted on April 24, 2002, with uncontrolled low back pain and inability to ambulate.  She  has a diagnosis of multiple sclerosis since 1977, and she states for a  period of three years she was in a wheelchair.  Ten days prior to admission,  she states that she twisted her back in the bath tub and felt excruciating  low back pain radiating into her buttocks and legs with burning pain and has  been unable to ambulate.  She has had problems with depression, migraines,  hypothyroidism and rotator cuff surgery.   ALLERGIES:  LEVAQUIN, CODEINE and NEOMYCIN.   PHYSICAL EXAMINATION:  The patient is in bed and states that she is not able  to sit up.  She rolls over and points to her back in the area where she has  pain.  She has tenderness over the paralumbar muscles without spasm.  Tenderness over the sacrum.  Pain with positive pinch test (positive  [place]).  She complains of pain with hip range of motion which is not  reproducible.  The patient has no significant atrophy in her lower  extremities despite her history of being wheelchair bound for two to three  years.   The patient has anterior tibial peroneal function.  Of notice, the previous  sacroiliac injections that she has had on myelogram and CT scan showing  minimal central stenosis in the mid lumbar region without neural or  foraminal compromise.  No bone scan has been done.   ASSESSMENT:  1. History  of multiple sclerosis, unconfirmed.  The patient has been taking     pain medication, both intravenous and by mouth, and states her pain is 9-     10/10, although she converses about it and other subjects in a casual     manner.   RECOMMENDATIONS:  1. I would recommend obtaining a bone scan.  If this is negative, get a CT     of the abdomen and pelvis.  If these tests are normal, as I expect they     may well be, then I would recommend transferring her to rehabilitation     for mobilization and rapid weaning of her pain medication.  2. It may be helpful to try and obtain old records from her previous     diagnosis  to confirm the diagnosis of MS or possibly see if she has had     some other similar admissions.  3. I will continue to follow with you and check her bone scan and CT of     abdomen and pelvis.   Thank you for the opportunity to see this patient in consultation.                                                Mark C. Ophelia Charter, M.D.    MCY/MEDQ  D:  05/09/2002  T:  05/10/2002  Job:  [id number]

## 2010-11-23 NOTE — Discharge Summary (Signed)
[name], [name]                         ACCOUNT NO.:  [id number]   MEDICAL RECORD NO.:  [id number]                   PATIENT TYPE:  INP   LOCATION:  0373                                 FACILITY:  [facility]   PHYSICIAN:  Anselm Pancoast. Zachery Dakins, M.D.          DATE OF BIRTH:  11/28/1965   DATE OF ADMISSION:  07/27/2003  DATE OF DISCHARGE:  07/29/2003                                 DISCHARGE SUMMARY   DISCHARGE DIAGNOSIS:  Normal appendix, right lower quadrant pain.   HISTORY:  [name] is a 45 year old female who presented to [facility]  with a 2-day history of progressive abdominal pain that started in the  epigastric area and shifted to the right lower quadrant.  She was seen by  Dr. __________, and had a temperature of 101, definitely right lower  quadrant tenderness, with muscle guarding, and on rectal and abdominal exam,  no other abnormalities were noted.  She had a white count of 13,000 with a  marked left shift, and urinalysis was unremarkable for blood, with just  occasional white cells.  Plain abdominal films were unremarkable, and she  thought the patient had acute appendicitis.  She was referred to the  emergency room.  She has a past history of multiple sclerosis, but this  appears to be in remission.  She is not using a wheelchair, and she has been  basically doing all her activities at this time.   PHYSICAL EXAMINATION:  ABDOMEN:  I was impressed that she was definitely  tender in the right lower quadrant with muscle guarding, much more in the  right lower quadrant than the left.  She has had a previous gallbladder that  she said was ruptured, and she had lived in [place] until approximately 2  years earlier.   I thought that this was most likely acute appendicitis, and needed to  proceed with laparoscopic appendectomy.  A CT was performed, as she was  definitely locally tender, with elevated white count and muscle guarding in  the right lower quadrant.   She  was taken to surgery, and under general anesthesia laparoscope was  inserted, and she was a little chronically constipated, but the appendix was  not acutely inflamed.  The appendix was removed, and pathology report did  not show acute inflammation.  The left and right tubes were inspected, and  there was a small amount of fluid within the pelvis, but not that of a large  amount of blood, and it did not appear to be infected.  I cultured this, and  could not find a definite cause of the right lower quadrant inflammation.   The patient postoperatively appeared to require pretty excessive quantities  of abdominal pain, but her white count became normal, and she was no longer  acutely tender, and was ready for discharge on the second postoperative day.  I am not sure of the etiology of her pain, as it did  not appear to be a  kidney stone, and it was relieved with the appendectomy.   CONDITION ON DISCHARGE:  The patient is discharged in improved condition.   DISCHARGE MEDICATIONS:  Tylox for pain.   FOLLOWUP:  She will see [place] in follow up in approximately 1 week.                                              Anselm Pancoast. Zachery Dakins, M.D.   WJW/MEDQ  D:  08/31/2003  T:  08/31/2003  Job:  [id number]

## 2010-11-26 ENCOUNTER — Ambulatory Visit (HOSPITAL_BASED_OUTPATIENT_CLINIC_OR_DEPARTMENT_OTHER)
Admission: RE | Admit: 2010-11-26 | Discharge: 2010-11-26 | Disposition: A | Discharge status: Home / Self Care | Payer: Medicare Other | Source: Ambulatory Visit | Attending: Orthopedic Surgery | Admitting: Orthopedic Surgery

## 2010-11-26 DIAGNOSIS — Z01812 Encounter for preprocedural laboratory examination: Secondary | ICD-10-CM | POA: Insufficient documentation

## 2010-11-26 DIAGNOSIS — J45909 Unspecified asthma, uncomplicated: Secondary | ICD-10-CM | POA: Insufficient documentation

## 2010-11-26 DIAGNOSIS — I10 Essential (primary) hypertension: Secondary | ICD-10-CM | POA: Insufficient documentation

## 2010-11-26 DIAGNOSIS — K219 Gastro-esophageal reflux disease without esophagitis: Secondary | ICD-10-CM | POA: Insufficient documentation

## 2010-11-26 DIAGNOSIS — R229 Localized swelling, mass and lump, unspecified: Secondary | ICD-10-CM | POA: Insufficient documentation

## 2010-11-29 ENCOUNTER — Ambulatory Visit: Payer: Self-pay | Admitting: Family Medicine

## 2010-11-30 ENCOUNTER — Ambulatory Visit: Payer: Self-pay | Admitting: Internal Medicine

## 2010-12-07 ENCOUNTER — Ambulatory Visit: Payer: Self-pay | Admitting: Internal Medicine

## 2011-01-25 NOTE — Op Note (Signed)
  [name], [name]             ACCOUNT NO.:  [id number]  MEDICAL RECORD NO.:  [id number]           PATIENT TYPE:  LOCATION:                                 FACILITY:  PHYSICIAN:  Cindee Salt, M.D.            DATE OF BIRTH:  DATE OF PROCEDURE:  11/26/2010 DATE OF DISCHARGE:                              OPERATIVE REPORT   PREOPERATIVE DIAGNOSIS:  Mass over flexor tendon, left middle finger.  POSTOPERATIVE DIAGNOSIS:  Mass over flexor tendon, left middle finger.  OPERATION:  Exploration with probable rupture flexor sheath cyst, left middle finger.  SURGEON:  Cindee Salt, MD  ANESTHESIA:  Forearm-based IV regional with local infiltration metacarpal block.  ANESTHESIOLOGIST:  Bedelia Person, MD  HISTORY:  The patient is a 45 year old female with a history of mass over the distal phalanx of her left middle finger.  She is desirous of having this removed.  Pre, peri, postoperative courses have been discussed along with the risks and complications.  She is aware of this potential to be a flexor sheath cyst, a nerve tumor or blood vessel tumor.  She is desirous of excision.  She is aware of risks and complications including infection; recurrence of injury to arteries, nerves, tendons; incomplete relief of symptoms, dystrophy.  In preoperative area, the patient is seen, the cyst marked by both the patient and surgeon.  PROCEDURE:  The patient was brought to the operating room where a forearm-based IV regional anesthetic was carried out without difficulty. She was prepped using ChloraPrep, supine position with the left arm free.  A 3-minute dry time was allowed.  Time-out taken confirming the patient, procedure.  A hockey-stick incision was made mid lateral over the middle phalanx and distally onto the pulp, carried down through subcutaneous tissue.  Neurovascular bundles were identified and protected.  These were found to be intact.  An area of scarring from probable cyst was  immediately encountered.  This was opened, debrided. The flexor tendon was explored.  No further lesions were identified. Beneath the flexor tendon, there were no lesions.  There was no gross swelling of the tendon of the joint.  No further masses were identified. The wound was irrigated.  The skin was closed with interrupted 5-0 Vicryl Rapide sutures.  A metacarpal block was given with 0.25% Marcaine without epinephrine, approximately 5 mL was used.  Sterile compressive dressing and splint to the finger were applied.  Deflation of the tourniquet, all fingers pinked.  She was taken to the recovery room for observation in satisfactory condition, and she will be discharged home to return to [facility] [place] in 1 week on [place] [facility].          ______________________________ Cindee Salt, M.D.     GK/MEDQ  D:  11/26/2010  T:  11/27/2010  Job:  [id number]  Electronically Signed by Cindee Salt M.D. on 01/25/2011 09:13:49 AM

## 2011-03-28 LAB — DIFFERENTIAL
Basophils Absolute: 0.1
Basophils Relative: 1
Basophils Relative: 1
Eosinophils Absolute: 0.6
Eosinophils Relative: 5
Lymphs Abs: 3.2
Monocytes Absolute: 0.8
Monocytes Absolute: 0.8
Monocytes Relative: 6
Neutro Abs: 6.8
Neutro Abs: 8.9 — ABNORMAL HIGH

## 2011-03-28 LAB — CBC
HCT: 37.7
Hemoglobin: 13.1
Hemoglobin: 14.1
MCHC: 34.8
MCHC: 35.1
MCV: 90.4
RBC: 4.44
RDW: 12.8

## 2011-04-04 LAB — BASIC METABOLIC PANEL
BUN: 12
Calcium: 10.1
Creatinine, Ser: 0.8
GFR calc non Af Amer: >60
Glucose, Bld: 98

## 2011-04-04 LAB — URINALYSIS, ROUTINE W REFLEX MICROSCOPIC
Glucose, UA: NEGATIVE
Ketones, ur: NEGATIVE
Nitrite: NEGATIVE
Protein, ur: NEGATIVE
pH: 7.5

## 2011-04-04 LAB — URINE MICROSCOPIC-ADD ON

## 2011-04-04 LAB — DIFFERENTIAL
Basophils Absolute: 0
Lymphocytes Relative: 23
Lymphs Abs: 1.9
Neutrophils Relative %: 70

## 2011-04-04 LAB — CBC
Platelets: 266
RDW: 12.2
WBC: 8.6

## 2011-04-12 LAB — CBC
HCT: 37
Hemoglobin: 12.8
MCHC: 34.4
MCV: 92.1
Platelets: 308
RDW: 12.8

## 2011-04-12 LAB — BASIC METABOLIC PANEL
BUN: 1 — ABNORMAL LOW
CO2: 31
GFR calc non Af Amer: >60
Glucose, Bld: 138 — ABNORMAL HIGH
Potassium: 3.4 — ABNORMAL LOW
Sodium: 139

## 2011-04-15 LAB — BASIC METABOLIC PANEL
BUN: 9
Calcium: 9.2
Creatinine, Ser: 0.71
GFR calc non Af Amer: >60
Glucose, Bld: 109 — ABNORMAL HIGH
Potassium: 3.3 — ABNORMAL LOW

## 2011-04-15 LAB — CBC
HCT: 44.6
Platelets: ADEQUATE
RDW: 13.1
WBC: 12 — ABNORMAL HIGH

## 2011-04-15 LAB — HCG, QUANTITATIVE, PREGNANCY: hCG, Beta Chain, Quant, S: <2

## 2011-05-16 ENCOUNTER — Emergency Department (HOSPITAL_BASED_OUTPATIENT_CLINIC_OR_DEPARTMENT_OTHER): Payer: Medicare Other

## 2011-05-16 ENCOUNTER — Emergency Department (INDEPENDENT_AMBULATORY_CARE_PROVIDER_SITE_OTHER): Payer: Medicare Other

## 2011-05-16 ENCOUNTER — Emergency Department (HOSPITAL_BASED_OUTPATIENT_CLINIC_OR_DEPARTMENT_OTHER)
Admission: EM | Admit: 2011-05-16 | Discharge: 2011-05-16 | Disposition: A | Discharge status: Home / Self Care | Payer: Medicare Other | Attending: Emergency Medicine | Admitting: Emergency Medicine

## 2011-05-16 ENCOUNTER — Encounter: Payer: Self-pay | Admitting: Family Medicine

## 2011-05-16 DIAGNOSIS — R1011 Right upper quadrant pain: Secondary | ICD-10-CM

## 2011-05-16 DIAGNOSIS — K219 Gastro-esophageal reflux disease without esophagitis: Secondary | ICD-10-CM | POA: Insufficient documentation

## 2011-05-16 DIAGNOSIS — Z86718 Personal history of other venous thrombosis and embolism: Secondary | ICD-10-CM | POA: Insufficient documentation

## 2011-05-16 DIAGNOSIS — I517 Cardiomegaly: Secondary | ICD-10-CM

## 2011-05-16 DIAGNOSIS — R110 Nausea: Secondary | ICD-10-CM | POA: Insufficient documentation

## 2011-05-16 DIAGNOSIS — I10 Essential (primary) hypertension: Secondary | ICD-10-CM | POA: Insufficient documentation

## 2011-05-16 DIAGNOSIS — R109 Unspecified abdominal pain: Secondary | ICD-10-CM

## 2011-05-16 DIAGNOSIS — R112 Nausea with vomiting, unspecified: Secondary | ICD-10-CM

## 2011-05-16 DIAGNOSIS — R188 Other ascites: Secondary | ICD-10-CM

## 2011-05-16 DIAGNOSIS — K7689 Other specified diseases of liver: Secondary | ICD-10-CM

## 2011-05-16 HISTORY — DX: Gastro-esophageal reflux disease without esophagitis: K21.9

## 2011-05-16 HISTORY — DX: Essential (primary) hypertension: I10

## 2011-05-16 HISTORY — DX: Acute embolism and thrombosis of unspecified deep veins of unspecified lower extremity: I82.409

## 2011-05-16 LAB — COMPREHENSIVE METABOLIC PANEL
ALT: 34 U/L (ref 0–35)
AST: 22 U/L (ref 0–37)
Albumin: 4.2 g/dL (ref 3.5–5.2)
Calcium: 10 mg/dL (ref 8.4–10.5)
Creatinine, Ser: 0.7 mg/dL (ref 0.50–1.10)
GFR calc non Af Amer: >90 mL/min (ref >90)
Sodium: 141 mEq/L (ref 135–145)
Total Protein: 7.5 g/dL (ref 6.0–8.3)

## 2011-05-16 LAB — URINALYSIS, ROUTINE W REFLEX MICROSCOPIC
Bilirubin Urine: NEGATIVE
Glucose, UA: NEGATIVE mg/dL
Hgb urine dipstick: NEGATIVE
Specific Gravity, Urine: 1.022 (ref 1.005–1.030)
Urobilinogen, UA: 0.2 mg/dL (ref 0.0–1.0)
pH: 5.5 (ref 5.0–8.0)

## 2011-05-16 LAB — CBC
HCT: 43.4 % (ref 36.0–46.0)
MCHC: 34.3 g/dL (ref 30.0–36.0)
MCV: 89.3 fL (ref 78.0–100.0)
Platelets: 257 10*3/uL (ref 150–400)
RDW: 12.5 % (ref 11.5–15.5)
WBC: 7.6 10*3/uL (ref 4.0–10.5)

## 2011-05-16 LAB — URINE MICROSCOPIC-ADD ON

## 2011-05-16 LAB — PREGNANCY, URINE: Preg Test, Ur: NEGATIVE

## 2011-05-16 LAB — DIFFERENTIAL
Basophils Absolute: 0 10*3/uL (ref 0.0–0.1)
Basophils Relative: 0 % (ref 0–1)
Eosinophils Absolute: 0.2 10*3/uL (ref 0.0–0.7)
Eosinophils Relative: 3 % (ref 0–5)
Monocytes Absolute: 0.4 10*3/uL (ref 0.1–1.0)

## 2011-05-16 LAB — LACTIC ACID, PLASMA: Lactic Acid, Venous: 0.9 mmol/L (ref 0.5–2.2)

## 2011-05-16 LAB — LIPASE, BLOOD: Lipase: 22 U/L (ref 11–59)

## 2011-05-16 MED ORDER — SODIUM CHLORIDE 0.9 % IV BOLUS (SEPSIS)
1000.0000 mL | Freq: Once | INTRAVENOUS | Status: AC
  Administered 2011-05-16: 1000 mL via INTRAVENOUS

## 2011-05-16 MED ORDER — ONDANSETRON HCL 4 MG/2ML IJ SOLN
4.0000 mg | Freq: Once | INTRAMUSCULAR | Status: AC
  Administered 2011-05-16: 4 mg via INTRAVENOUS
  Filled 2011-05-16: qty 2

## 2011-05-16 MED ORDER — ONDANSETRON HCL 4 MG PO TABS: 4.0000 mg | ORAL_TABLET | Freq: Four times a day (QID) | ORAL | Status: AC

## 2011-05-16 MED ORDER — TRAMADOL HCL 50 MG PO TABS: 50.0000 mg | ORAL_TABLET | Freq: Four times a day (QID) | ORAL | Status: AC | PRN

## 2011-05-16 MED ORDER — HYDROMORPHONE HCL PF 1 MG/ML IJ SOLN
1.0000 mg | Freq: Once | INTRAMUSCULAR | Status: AC
  Administered 2011-05-16: 1 mg via INTRAVENOUS
  Filled 2011-05-16: qty 1

## 2011-05-16 MED ORDER — CEPHALEXIN 500 MG PO CAPS: 500.0000 mg | ORAL_CAPSULE | Freq: Four times a day (QID) | ORAL | Status: AC

## 2011-05-16 MED ORDER — IOHEXOL 300 MG/ML  SOLN
100.0000 mL | Freq: Once | INTRAMUSCULAR | Status: AC | PRN
  Administered 2011-05-16: 100 mL via INTRAVENOUS

## 2011-05-16 MED ORDER — SODIUM CHLORIDE 0.9 % IV SOLN: INTRAVENOUS | Status: DC

## 2011-05-16 NOTE — ED Provider Notes (Signed)
History     CSN: [id number] Arrival date & time: 05/16/2011 10:10 AM   First MD Initiated Contact with Patient 05/16/11 1043      Chief Complaint  Patient presents with  . Abdominal Pain    (Consider location/radiation/quality/duration/timing/severity/associated sxs/prior treatment) HPI Comments: Patient presents with right upper quadrant pain that is sharp, colicky and stabbing has been intermittent for the past 3 days but worse today.  This is associated with nausea but no vomiting. Had normal bowel movements today no diarrhea. Patient has a history of cholecystectomy 10 years ago as well as appendectomy. She also has a history of kidney stones but denies any dysuria or hematuria. No vaginal bleeding or discharge. She has had a previous hysterectomy. She states she was awoken from sleep with a sensation of somebody is punching her in the back or the right upper quadrant. This pain comes and goes in waves.  The history is provided by the patient.    Past Medical History  Diagnosis Date  . Hypertension   . GERD (gastroesophageal reflux disease)   . Fluid retention   . DVT (deep venous thrombosis)     Past Surgical History  Procedure Date  . Abdominal hysterectomy   . Cholecystectomy   . Appendectomy   . Cardiac catheterization     No family history on file.  History  Substance Use Topics  . Smoking status: Never Smoker   . Smokeless tobacco: Not on file  . Alcohol Use: No    OB History    Grav Para Term Preterm Abortions TAB SAB Ect Mult Living                  Review of Systems  Constitutional: Positive for appetite change. Negative for fever and activity change.  HENT: Negative for congestion and rhinorrhea.   Respiratory: Negative for cough, chest tightness and shortness of breath.   Cardiovascular: Negative for chest pain.  Gastrointestinal: Positive for nausea and abdominal pain. Negative for vomiting, diarrhea and constipation.  Genitourinary: Negative  for dysuria and hematuria.  Musculoskeletal: Positive for back pain. Negative for myalgias and arthralgias.  Skin: Negative for rash.  Neurological: Negative for headaches.  Hematological: Negative.   Psychiatric/Behavioral: Negative.     Allergies  Codeine; Erythromycin; Levofloxacin; Oxycodone hcl; and Telithromycin  Home Medications   Current Outpatient Rx  Name Route Sig Dispense Refill  . AMPHETAMINE-DEXTROAMPHETAMINE 30 MG PO CP24 Oral Take 30 mg by mouth every morning.      Marland Kitchen DEXILANT PO Oral Take 60 mg by mouth.      Marland Kitchen HYDROCHLOROTHIAZIDE PO Oral Take by mouth as needed.      Marland Kitchen METOPROLOL TARTRATE 25 MG PO TABS Oral Take 25 mg by mouth 2 (two) times daily.        BP 126/74  Pulse 67  Temp(Src) 97.9 F (36.6 C) (Oral)  Resp 16  Ht 5\' 1"  (1.549 m)  Wt 185 lb (83.915 kg)  BMI 34.96 kg/m2  SpO2 100%  Physical Exam  Constitutional: She is oriented to person, place, and time. She appears well-developed and well-nourished. No distress.       Moderately uncomfortable  HENT:  Head: Normocephalic and atraumatic.  Mouth/Throat: Oropharynx is clear and moist. No oropharyngeal exudate.  Eyes: Conjunctivae are normal. Pupils are equal, round, and reactive to light.  Neck: Normal range of motion.  Cardiovascular: Normal rate, regular rhythm and normal heart sounds.   Pulmonary/Chest: Effort normal and breath sounds normal. No respiratory  distress.  Abdominal: Soft. There is tenderness. There is rebound and guarding.       Right upper quadrant tenderness  Musculoskeletal: Normal range of motion. She exhibits tenderness. She exhibits no edema.       Right paraspinal tenderness on the right  Neurological: She is alert and oriented to person, place, and time. No cranial nerve deficit.  Skin: Skin is warm.    ED Course  Procedures (including critical care time)  Labs Reviewed  COMPREHENSIVE METABOLIC PANEL - Abnormal; Notable for the following:    Glucose, Bld 104 (*)     All other components within normal limits  URINALYSIS, ROUTINE W REFLEX MICROSCOPIC - Abnormal; Notable for the following:    Appearance CLOUDY (*)    Leukocytes, UA SMALL (*)    All other components within normal limits  URINE MICROSCOPIC-ADD ON - Abnormal; Notable for the following:    Squamous Epithelial / LPF FEW (*)    Bacteria, UA MANY (*)    All other components within normal limits  CBC  DIFFERENTIAL  PREGNANCY, URINE  LIPASE, BLOOD  LACTIC ACID, PLASMA  D-DIMER, QUANTITATIVE  URINE CULTURE   Dg Chest 2 View  05/16/2011  *RADIOLOGY REPORT*  Clinical Data: Right upper quadrant abdominal pain and right-sided chest pain.  History of coronary artery disease with stenting.  CHEST - 2 VIEW 05/16/2011:  Comparison: CT chest 05/17/2010, 05/04/2010, and 09/09/2009 [facility].  Portable chest x-ray 09/07/2009 [facility].  Findings: Cardiac silhouette upper normal in size to slightly enlarged for the AP technique, unchanged.  Hilar and mediastinal contours unremarkable. Lungs clear.  Bronchovascular markings normal.  No pleural effusions.  No pneumothorax.  Visualized bony thorax intact with slight thoracic scoliosis convex right.  IMPRESSION: Stable borderline to mild cardiomegaly.  No acute cardiopulmonary disease.  Original Report Authenticated By: Arnell Sieving, M.D.   US Abdomen Complete  05/16/2011  *RADIOLOGY REPORT*  Clinical Data:  Lambert Mody right abdominal pain, nausea, status post cholecystectomy, appendectomy, and hysterectomy, history of pancreatitis and renal stones  COMPLETE ABDOMINAL ULTRASOUND  Comparison:  [place] CT abdomen/pelvis dated 05/17/2010  Findings:  Gallbladder:  Surgically absent.  Common bile duct:  Measures 4 mm.  Liver:  Hyperechoic hepatic parenchyma, possibly reflecting hepatic steatosis.  No focal hepatic lesion is seen.  IVC:  Poorly visualized due to overlying bowel gas.  Pancreas:  Poorly visualized due to overlying bowel gas.  Spleen:   Measures 5.6 cm.  Right Kidney:  Measures 9.5 cm.  No mass or hydronephrosis.  Left Kidney:  Measures 11.4 cm.  No mass or hydronephrosis.  Abdominal aorta:  No aneurysm identified.  IMPRESSION: Possible hepatic steatosis.  Status post cholecystectomy.  No intrahepatic or extrahepatic ductal dilatation.  Original Report Authenticated By: Charline Bills, M.D.   Ct Abdomen Pelvis W Contrast  05/16/2011  *RADIOLOGY REPORT*  Clinical Data: Right upper quadrant pain, nausea, vomiting, sweats, history kidney stones, cholecystectomy, hypertension, pancreatitis, appendectomy, hysterectomy, GERD, multiple sclerosis  CT ABDOMEN AND PELVIS WITH CONTRAST  Technique:  Multidetector CT imaging of the abdomen and pelvis was performed following the standard protocol during bolus administration of intravenous contrast. Sagittal and coronal MPR images reconstructed from axial data set.  Contrast: OMNIPAQUE IOHEXOL 300 MG/ML IV SOLN.  Dilute oral contrast.  Comparison: 05/17/2010  Findings: Minimal dependent atelectasis at lung bases. Post cholecystectomy, appendectomy, hysterectomy and suspect oophorectomy. Minimal fatty infiltration of liver. Liver, spleen, pancreas, kidneys, and adrenal glands otherwise normal. Normal-appearing bladder and  ureters. Stomach and bowel loops normal appearance. Retroaortic left renal vein. Minimal atherosclerotic calcification aorta. No mass, adenopathy, free fluid or inflammatory process. No hernia or acute bony lesion.  IMPRESSION: No acute intra abdominal or intrapelvic abnormalities.  Original Report Authenticated By: Lollie Marrow, M.D.     No diagnosis found.    MDM  Right upper quadrant pain, nausea, previous cholecystectomy, associated with nausea.  TTP on exam, nontoxic appearance.  Despite previous GB surgery, possibility of retained stone exists and will check [place].  Negative workup discussed with patient.  No biliary duct dilation on [place].  Normal LFTS, lipase.  No  evidence of pneumonia on CXR.  Ddimer neg, no suggestion of PE.  No acute pathology on CT.  Bacteruria without evidence of frank infection, culture sent.  Patient is frustrated by lack of definitive diagnosis but I explained how many life threatening problems have been ruled out.  She is tolerating PO in the ED, pain controlled.  Advised patient would treat questionable UA.  Advised to monitor abdominal area for any rash that develops as pain of shingles may precede rash. She has follow up with her doctor in [place] next week.  Encouraged to return to the ED if symptoms worsen or she develops any new concerns.         Glynn Octave, MD 05/16/11 [phone]

## 2011-05-16 NOTE — ED Notes (Signed)
Pt c/o RUQ pain x 3 days "colicky pain" worse and more constant today". Pt reports h/o kidney stone. Pt denies vomiting/diarrhea. Pt reports normal bowel movements.

## 2011-05-17 ENCOUNTER — Emergency Department (INDEPENDENT_AMBULATORY_CARE_PROVIDER_SITE_OTHER): Payer: Medicare Other

## 2011-05-17 ENCOUNTER — Emergency Department (HOSPITAL_BASED_OUTPATIENT_CLINIC_OR_DEPARTMENT_OTHER)
Admission: EM | Admit: 2011-05-17 | Discharge: 2011-05-17 | Disposition: A | Discharge status: Home / Self Care | Payer: Medicare Other | Attending: Emergency Medicine | Admitting: Emergency Medicine

## 2011-05-17 ENCOUNTER — Encounter (HOSPITAL_BASED_OUTPATIENT_CLINIC_OR_DEPARTMENT_OTHER): Payer: Self-pay | Admitting: *Deleted

## 2011-05-17 ENCOUNTER — Other Ambulatory Visit: Payer: Self-pay

## 2011-05-17 DIAGNOSIS — R079 Chest pain, unspecified: Secondary | ICD-10-CM

## 2011-05-17 DIAGNOSIS — Z79899 Other long term (current) drug therapy: Secondary | ICD-10-CM | POA: Insufficient documentation

## 2011-05-17 DIAGNOSIS — Z86718 Personal history of other venous thrombosis and embolism: Secondary | ICD-10-CM | POA: Insufficient documentation

## 2011-05-17 DIAGNOSIS — K219 Gastro-esophageal reflux disease without esophagitis: Secondary | ICD-10-CM | POA: Insufficient documentation

## 2011-05-17 DIAGNOSIS — R0789 Other chest pain: Secondary | ICD-10-CM

## 2011-05-17 DIAGNOSIS — I10 Essential (primary) hypertension: Secondary | ICD-10-CM | POA: Insufficient documentation

## 2011-05-17 DIAGNOSIS — R071 Chest pain on breathing: Secondary | ICD-10-CM | POA: Insufficient documentation

## 2011-05-17 LAB — DIFFERENTIAL
Basophils Absolute: 0 10*3/uL (ref 0.0–0.1)
Basophils Relative: 0 % (ref 0–1)
Lymphocytes Relative: 29 % (ref 12–46)
Monocytes Absolute: 0.8 10*3/uL (ref 0.1–1.0)
Neutro Abs: 6 10*3/uL (ref 1.7–7.7)
Neutrophils Relative %: 60 % (ref 43–77)

## 2011-05-17 LAB — CBC
MCHC: 34.9 g/dL (ref 30.0–36.0)
Platelets: 268 10*3/uL (ref 150–400)
RDW: 12.7 % (ref 11.5–15.5)
WBC: 10 10*3/uL (ref 4.0–10.5)

## 2011-05-17 LAB — BASIC METABOLIC PANEL
CO2: 25 mEq/L (ref 19–32)
Chloride: 103 mEq/L (ref 96–112)
Creatinine, Ser: 0.7 mg/dL (ref 0.50–1.10)
GFR calc Af Amer: >90 mL/min (ref >90)
Potassium: 3.9 mEq/L (ref 3.5–5.1)
Sodium: 139 mEq/L (ref 135–145)

## 2011-05-17 LAB — CARDIAC PANEL(CRET KIN+CKTOT+MB+TROPI)
CK, MB: 1.6 ng/mL (ref 0.3–4.0)
Relative Index: INVALID (ref 0.0–2.5)
Total CK: 60 U/L (ref 7–177)

## 2011-05-17 MED ORDER — HYDROMORPHONE HCL PF 1 MG/ML IJ SOLN
1.0000 mg | Freq: Once | INTRAMUSCULAR | Status: AC
  Administered 2011-05-17: 1 mg via INTRAVENOUS
  Filled 2011-05-17: qty 1

## 2011-05-17 MED ORDER — ONDANSETRON HCL 4 MG/2ML IJ SOLN
4.0000 mg | Freq: Once | INTRAMUSCULAR | Status: AC
  Administered 2011-05-17: 4 mg via INTRAVENOUS
  Filled 2011-05-17: qty 2

## 2011-05-17 MED ORDER — IOHEXOL 350 MG/ML SOLN
100.0000 mL | Freq: Once | INTRAVENOUS | Status: AC | PRN
  Administered 2011-05-17: 100 mL via INTRAVENOUS

## 2011-05-17 NOTE — ED Provider Notes (Signed)
History     CSN: [id number] Arrival date & time: 05/17/2011  7:13 PM   First MD Initiated Contact with Patient 05/17/11 1959      Chief Complaint  Patient presents with  . Chest Pain    (Consider location/radiation/quality/duration/timing/severity/associated sxs/prior treatment) HPI  Past Medical History  Diagnosis Date  . Hypertension   . GERD (gastroesophageal reflux disease)   . Fluid retention   . DVT (deep venous thrombosis)     Past Surgical History  Procedure Date  . Abdominal hysterectomy   . Cholecystectomy   . Appendectomy   . Cardiac catheterization     No family history on file.  History  Substance Use Topics  . Smoking status: Never Smoker   . Smokeless tobacco: Not on file  . Alcohol Use: No    OB History    Grav Para Term Preterm Abortions TAB SAB Ect Mult Living                  Review of Systems  Allergies  Codeine; Erythromycin; Levofloxacin; Oxycodone hcl; and Telithromycin  Home Medications   Current Outpatient Rx  Name Route Sig Dispense Refill  . AMPHETAMINE-DEXTROAMPHETAMINE 30 MG PO CP24 Oral Take 30 mg by mouth every morning.      . CEPHALEXIN 500 MG PO CAPS Oral Take 1 capsule (500 mg total) by mouth 4 (four) times daily. 40 capsule 0  . DEXILANT PO Oral Take 60 mg by mouth.      Marland Kitchen HYDROCHLOROTHIAZIDE PO Oral Take by mouth as needed.      Marland Kitchen METOPROLOL TARTRATE 25 MG PO TABS Oral Take 25 mg by mouth 2 (two) times daily.      Marland Kitchen ONDANSETRON HCL 4 MG PO TABS Oral Take 1 tablet (4 mg total) by mouth every 6 (six) hours. 12 tablet 0  . TRAMADOL HCL 50 MG PO TABS Oral Take 1 tablet (50 mg total) by mouth every 6 (six) hours as needed for pain. Maximum dose= 8 tablets per day 15 tablet 0    BP 134/69  Pulse 70  Temp(Src) 98 F (36.7 C) (Oral)  Resp 26  SpO2 100%  Physical Exam  ED Course  Procedures (including critical care time)  Labs Reviewed  CBC - Abnormal; Notable for the following:    Hemoglobin 15.1 (*)    All  other components within normal limits  BASIC METABOLIC PANEL - Abnormal; Notable for the following:    Glucose, Bld 117 (*)    All other components within normal limits  DIFFERENTIAL  CARDIAC PANEL(CRET KIN+CKTOT+MB+TROPI)   Dg Chest 2 View  05/16/2011  *RADIOLOGY REPORT*  Clinical Data: Right upper quadrant abdominal pain and right-sided chest pain.  History of coronary artery disease with stenting.  CHEST - 2 VIEW 05/16/2011:  Comparison: CT chest 05/17/2010, 05/04/2010, and 09/09/2009 [facility].  Portable chest x-ray 09/07/2009 [facility].  Findings: Cardiac silhouette upper normal in size to slightly enlarged for the AP technique, unchanged.  Hilar and mediastinal contours unremarkable. Lungs clear.  Bronchovascular markings normal.  No pleural effusions.  No pneumothorax.  Visualized bony thorax intact with slight thoracic scoliosis convex right.  IMPRESSION: Stable borderline to mild cardiomegaly.  No acute cardiopulmonary disease.  Original Report Authenticated By: Arnell Sieving, M.D.   Ct Angio Chest W/cm &/or Wo Cm  05/17/2011  *RADIOLOGY REPORT*  Clinical Data:  Chest pain.  CT ANGIOGRAPHY CHEST WITH CONTRAST  Technique:  Multidetector CT imaging of the chest  was performed using the standard protocol during bolus administration of intravenous contrast.  Multiplanar CT image reconstructions including MIPs were obtained to evaluate the vascular anatomy.  Contrast:  100 ml Omnipaque-350  Comparison:  Multiple prior chest CTs from 2011.  Findings:  The chest wall is unremarkable.  No breast masses, supraclavicular or axillary lymphadenopathy.  The bony thorax is intact and stable.  The heart is normal in size.  No pericardial effusion.  No mediastinal or hilar lymphadenopathy.  The aorta is normal in caliber.  No dissection.  The major branch vessels are normal.  The esophagus is grossly normal.  The pulmonary arterial tree is well opacified.  No filling defects to suggest  pulmonary emboli.  The lungs are clear of acute process.  Mild dependent atelectasis/edema.  No pleural effusions.  No pulmonary lesions. The tracheobronchial tree is normal.  The upper abdomen demonstrates fatty infiltration of the liver but no focal lesions.  Review of the MIP images confirms the above findings.  IMPRESSION:  1.  No CT findings for pulmonary embolism. 2.  Normal thoracic aorta. 3.  No acute pulmonary findings.  Original Report Authenticated By: P. Loralie Champagne, M.D.   US Abdomen Complete  05/16/2011  *RADIOLOGY REPORT*  Clinical Data:  Lambert Mody right abdominal pain, nausea, status post cholecystectomy, appendectomy, and hysterectomy, history of pancreatitis and renal stones  COMPLETE ABDOMINAL ULTRASOUND  Comparison:  [place] CT abdomen/pelvis dated 05/17/2010  Findings:  Gallbladder:  Surgically absent.  Common bile duct:  Measures 4 mm.  Liver:  Hyperechoic hepatic parenchyma, possibly reflecting hepatic steatosis.  No focal hepatic lesion is seen.  IVC:  Poorly visualized due to overlying bowel gas.  Pancreas:  Poorly visualized due to overlying bowel gas.  Spleen:  Measures 5.6 cm.  Right Kidney:  Measures 9.5 cm.  No mass or hydronephrosis.  Left Kidney:  Measures 11.4 cm.  No mass or hydronephrosis.  Abdominal aorta:  No aneurysm identified.  IMPRESSION: Possible hepatic steatosis.  Status post cholecystectomy.  No intrahepatic or extrahepatic ductal dilatation.  Original Report Authenticated By: Charline Bills, M.D.   Ct Abdomen Pelvis W Contrast  05/16/2011  *RADIOLOGY REPORT*  Clinical Data: Right upper quadrant pain, nausea, vomiting, sweats, history kidney stones, cholecystectomy, hypertension, pancreatitis, appendectomy, hysterectomy, GERD, multiple sclerosis  CT ABDOMEN AND PELVIS WITH CONTRAST  Technique:  Multidetector CT imaging of the abdomen and pelvis was performed following the standard protocol during bolus administration of intravenous contrast. Sagittal and coronal  MPR images reconstructed from axial data set.  Contrast: OMNIPAQUE IOHEXOL 300 MG/ML IV SOLN.  Dilute oral contrast.  Comparison: 05/17/2010  Findings: Minimal dependent atelectasis at lung bases. Post cholecystectomy, appendectomy, hysterectomy and suspect oophorectomy. Minimal fatty infiltration of liver. Liver, spleen, pancreas, kidneys, and adrenal glands otherwise normal. Normal-appearing bladder and ureters. Stomach and bowel loops normal appearance. Retroaortic left renal vein. Minimal atherosclerotic calcification aorta. No mass, adenopathy, free fluid or inflammatory process. No hernia or acute bony lesion.  IMPRESSION: No acute intra abdominal or intrapelvic abnormalities.  Original Report Authenticated By: Lollie Marrow, M.D.     No diagnosis found.    MDM   Pt checked out by Langston Masker.  CT of chest is neg.  Will d/c pt home.        Gwyneth Sprout, MD 05/17/11 2238

## 2011-05-17 NOTE — ED Notes (Signed)
Was seen here yesterday for RUQ pain. 15 mins ago she had sudden onset sharp pain under her left breast.

## 2011-05-17 NOTE — ED Notes (Signed)
Pt stated no additional questions at this time and advised to follow up with PMD if pain persists

## 2011-05-17 NOTE — ED Provider Notes (Signed)
History     CSN: [id number] Arrival date & time: 05/17/2011  7:13 PM   First MD Initiated Contact with Patient 05/17/11 1959      Chief Complaint  Patient presents with  . Chest Pain    (Consider location/radiation/quality/duration/timing/severity/associated sxs/prior treatment) Patient is a 45 y.o. female presenting with chest pain. The history is provided by the patient. No language interpreter was used.  Chest Pain The chest pain began 3 - 5 hours ago. Chest pain occurs constantly. The chest pain is unchanged. The pain is associated with breathing, coughing and exertion. At its most intense, the pain is at 9/10. The pain is currently at 9/10. The severity of the pain is severe. The quality of the pain is described as aching, sharp and stabbing. The pain radiates to the upper back. She tried nothing for the symptoms. Risk factors include obesity. Past medical history comments: hx of dvt  Her family medical history is significant for CAD in family.   Pt was seen here yesterday for right upper quadrant pain.  Pt had a ct scan of abdomen.  Pt reports today she began having pain in her left upper quadrant.  Pt reports she had a cardiac cath 1 year ago by Dr. Allyson Sabal that was normal.    Pt had a dvt several years ago.  Pt is concerned about blood clot.  Pt concerned that her potassium could be low.  Past Medical History  Diagnosis Date  . Hypertension   . GERD (gastroesophageal reflux disease)   . Fluid retention   . DVT (deep venous thrombosis)     Past Surgical History  Procedure Date  . Abdominal hysterectomy   . Cholecystectomy   . Appendectomy   . Cardiac catheterization     No family history on file.  History  Substance Use Topics  . Smoking status: Never Smoker   . Smokeless tobacco: Not on file  . Alcohol Use: No    OB History    Grav Para Term Preterm Abortions TAB SAB Ect Mult Living                  Review of Systems  Cardiovascular: Positive for chest pain.   All other systems reviewed and are negative.    Allergies  Codeine; Erythromycin; Levofloxacin; Oxycodone hcl; and Telithromycin  Home Medications   Current Outpatient Rx  Name Route Sig Dispense Refill  . AMPHETAMINE-DEXTROAMPHETAMINE 30 MG PO CP24 Oral Take 30 mg by mouth every morning.      . CEPHALEXIN 500 MG PO CAPS Oral Take 1 capsule (500 mg total) by mouth 4 (four) times daily. 40 capsule 0  . DEXILANT PO Oral Take 60 mg by mouth.      Marland Kitchen HYDROCHLOROTHIAZIDE PO Oral Take by mouth as needed.      Marland Kitchen METOPROLOL TARTRATE 25 MG PO TABS Oral Take 25 mg by mouth 2 (two) times daily.      Marland Kitchen ONDANSETRON HCL 4 MG PO TABS Oral Take 1 tablet (4 mg total) by mouth every 6 (six) hours. 12 tablet 0  . TRAMADOL HCL 50 MG PO TABS Oral Take 1 tablet (50 mg total) by mouth every 6 (six) hours as needed for pain. Maximum dose= 8 tablets per day 15 tablet 0    BP 134/69  Pulse 70  Temp(Src) 98 F (36.7 C) (Oral)  Resp 26  SpO2 100%  Physical Exam  Nursing note and vitals reviewed. Constitutional: She is oriented to person, place,  and time. She appears well-developed and well-nourished.  HENT:  Head: Normocephalic and atraumatic.  Right Ear: External ear normal.  Left Ear: External ear normal.  Eyes: Conjunctivae are normal. Pupils are equal, round, and reactive to light.  Neck: Normal range of motion. Neck supple.  Cardiovascular: Normal rate and regular rhythm.   Pulmonary/Chest: Effort normal.  Abdominal: Soft.  Musculoskeletal: Normal range of motion.  Neurological: She is alert and oriented to person, place, and time. She has normal reflexes.  Skin: Skin is warm.    ED Course  Procedures (including critical care time)  Labs Reviewed  CBC - Abnormal; Notable for the following:    Hemoglobin 15.1 (*)    All other components within normal limits  BASIC METABOLIC PANEL - Abnormal; Notable for the following:    Glucose, Bld 117 (*)    All other components within normal limits    DIFFERENTIAL  CARDIAC PANEL(CRET KIN+CKTOT+MB+TROPI)   Dg Chest 2 View  05/16/2011  *RADIOLOGY REPORT*  Clinical Data: Right upper quadrant abdominal pain and right-sided chest pain.  History of coronary artery disease with stenting.  CHEST - 2 VIEW 05/16/2011:  Comparison: CT chest 05/17/2010, 05/04/2010, and 09/09/2009 [facility].  Portable chest x-ray 09/07/2009 [facility].  Findings: Cardiac silhouette upper normal in size to slightly enlarged for the AP technique, unchanged.  Hilar and mediastinal contours unremarkable. Lungs clear.  Bronchovascular markings normal.  No pleural effusions.  No pneumothorax.  Visualized bony thorax intact with slight thoracic scoliosis convex right.  IMPRESSION: Stable borderline to mild cardiomegaly.  No acute cardiopulmonary disease.  Original Report Authenticated By: Arnell Sieving, M.D.   US Abdomen Complete  05/16/2011  *RADIOLOGY REPORT*  Clinical Data:  Lambert Mody right abdominal pain, nausea, status post cholecystectomy, appendectomy, and hysterectomy, history of pancreatitis and renal stones  COMPLETE ABDOMINAL ULTRASOUND  Comparison:  [facility] CT abdomen/pelvis dated 05/17/2010  Findings:  Gallbladder:  Surgically absent.  Common bile duct:  Measures 4 mm.  Liver:  Hyperechoic hepatic parenchyma, possibly reflecting hepatic steatosis.  No focal hepatic lesion is seen.  IVC:  Poorly visualized due to overlying bowel gas.  Pancreas:  Poorly visualized due to overlying bowel gas.  Spleen:  Measures 5.6 cm.  Right Kidney:  Measures 9.5 cm.  No mass or hydronephrosis.  Left Kidney:  Measures 11.4 cm.  No mass or hydronephrosis.  Abdominal aorta:  No aneurysm identified.  IMPRESSION: Possible hepatic steatosis.  Status post cholecystectomy.  No intrahepatic or extrahepatic ductal dilatation.  Original Report Authenticated By: Charline Bills, M.D.   Ct Abdomen Pelvis W Contrast  05/16/2011  *RADIOLOGY REPORT*  Clinical Data: Right upper  quadrant pain, nausea, vomiting, sweats, history kidney stones, cholecystectomy, hypertension, pancreatitis, appendectomy, hysterectomy, GERD, multiple sclerosis  CT ABDOMEN AND PELVIS WITH CONTRAST  Technique:  Multidetector CT imaging of the abdomen and pelvis was performed following the standard protocol during bolus administration of intravenous contrast. Sagittal and coronal MPR images reconstructed from axial data set.  Contrast: OMNIPAQUE IOHEXOL 300 MG/ML IV SOLN.  Dilute oral contrast.  Comparison: 05/17/2010  Findings: Minimal dependent atelectasis at lung bases. Post cholecystectomy, appendectomy, hysterectomy and suspect oophorectomy. Minimal fatty infiltration of liver. Liver, spleen, pancreas, kidneys, and adrenal glands otherwise normal. Normal-appearing bladder and ureters. Stomach and bowel loops normal appearance. Retroaortic left renal vein. Minimal atherosclerotic calcification aorta. No mass, adenopathy, free fluid or inflammatory process. No hernia or acute bony lesion.  IMPRESSION: No acute intra abdominal or intrapelvic abnormalities.  Original Report Authenticated By: Lollie Marrow, M.D.  .  No diagnosis found.  Date: 05/17/2011  Rate: 79  Rhythm: normal sinus rhythm  QRS Axis: normal  Intervals: normal  ST/T Wave abnormalities: normal  Conduction Disutrbances:none  Narrative Interpretation:   Old EKG Reviewed: unchanged    MDM  I reviewed yesterdays visit.  I will repeat labs and ct chest        Langston Masker, [place] 05/17/11 2115

## 2011-05-17 NOTE — ED Notes (Signed)
Pt had a negative heart cath last year.

## 2011-05-18 LAB — URINE CULTURE: Colony Count: 15000

## 2011-05-18 NOTE — ED Provider Notes (Signed)
Medical screening examination/treatment/procedure(s) were performed by non-physician practitioner and as supervising physician I was immediately available for consultation/collaboration.   Gwyneth Sprout, MD 05/18/11 [phone]

## 2011-06-05 ENCOUNTER — Ambulatory Visit (INDEPENDENT_AMBULATORY_CARE_PROVIDER_SITE_OTHER): Payer: Medicare Other | Admitting: Cardiology

## 2011-06-05 ENCOUNTER — Encounter: Payer: Self-pay | Admitting: Cardiology

## 2011-06-05 VITALS — BP 142/74 | HR 97 | Ht 60.0 in | Wt 192.0 lb

## 2011-06-05 DIAGNOSIS — I10 Essential (primary) hypertension: Secondary | ICD-10-CM

## 2011-06-05 DIAGNOSIS — R079 Chest pain, unspecified: Secondary | ICD-10-CM

## 2011-06-05 DIAGNOSIS — R000 Tachycardia, unspecified: Secondary | ICD-10-CM

## 2011-06-05 DIAGNOSIS — R002 Palpitations: Secondary | ICD-10-CM

## 2011-06-05 MED ORDER — AMLODIPINE BESYLATE 2.5 MG PO TABS: 2.5000 mg | ORAL_TABLET | Freq: Every day | ORAL | Status: DC

## 2011-06-05 MED ORDER — METOPROLOL SUCCINATE ER 50 MG PO TB24: 75.0000 mg | ORAL_TABLET | Freq: Every day | ORAL | Status: DC

## 2011-06-05 NOTE — Patient Instructions (Signed)
Your physician has recommended you make the following change in your medication:  STOP Metoprolol Tartrate START Toprol XL 75 mg once daily.  This will be sent to your pharmacy as Toprol XL 50 mg, take 1.5 tablets once daily START Amlodipine 2.5 mg.  Take one tablet once daily. These Prescriptions have been sent to your pharmacy.  Your physician recommends that you schedule a follow-up appointment in:  2 weeks with Dr. Shirlee Latch  We will request monitor results from Dr. Hazle Coca office.

## 2011-06-06 DIAGNOSIS — R002 Palpitations: Secondary | ICD-10-CM | POA: Insufficient documentation

## 2011-06-06 DIAGNOSIS — R079 Chest pain, unspecified: Secondary | ICD-10-CM | POA: Insufficient documentation

## 2011-06-06 NOTE — Assessment & Plan Note (Signed)
Starting amlodipine, as above.

## 2011-06-06 NOTE — Progress Notes (Signed)
PCP: Dr. Nilda Simmer  45 yo with history of chest pain and palpitations presents for evaluation of both these symptoms.   Patient has a long history of atypical chest pain.  She actually had a cath in 3/11 showing no angiographic CAD.   Most recent chest pain episode was earlier this month.  She had the onset of very severe substernal chest aching/crushing and came to the ER.  ECG was unchanged, 1 set of cardiac enzymes was negative, and CTA chest was negative for PE or dissection.  She was sent home from the ER.  Chest pain lasted about 1/2 hour.  She had a mild episode of chest pain the next morning but has had no further pain for at least 2 weeks now.  She pain episode this month seemed different than her prior GERD.  GERD has been well-suppressed with Dexilant.  Patient uses Adderall on occasion but had not been using prior to the most recent chest pain episode.    Patient additionally has episodes of palpitations where she feels her heart racing.  This is especially noticeable at night when she is lying down.  She has had palpitations in the past that have been suppressed by a beta blocker.  Her current beta blocker dose has not been helping (Lopressor).  Heart rate gets as high as the 100s when she feels it racing.     Patient has had problems recently with elevated BP.  It is in the 160s systolic in the office today, she says that it runs mostly in the 130s when she checks at home but that it sometimes spikes up to the 160s systolic.    Orthostatics were checked today and were negative.   Patient was seen in another cardiology office a week or so ago.  She is wearing a 3-week event monitor that has so far showed only mild sinus tachycardia (in the 100s).  She was scheduled for a myoview today.    ECG: NSR at 105, otherwise normal  Labs (11/12): K 3.9, creatinine 0.7  PMH: 1. H/o DVT 2. Chest pain.  Left heart cath (3/11): no coronary disease.  CTA chest (11/12): No PE, no dissection.  3.  Appendectomy 4. Cholecystectomy 5. Multiple sclerosis: Remission for a number of years.  6. H/o hysterectomy 7. GERD 8. HTN 9. Echo (3/11): EF 55-60%, mild MR and AI 10. Tachycardia: ? 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   FH: HTN.  No premature CAD.   ROS: All systems reivewed and negative except as per HPI.   Current Outpatient Prescriptions  Medication Sig Dispense Refill  . albuterol (PROVENTIL HFA;VENTOLIN HFA) 108 (90 BASE) MCG/ACT inhaler Inhale 2 puffs into the lungs every 6 (six) hours as needed.        Marland Kitchen amphetamine-dextroamphetamine (ADDERALL XR) 30 MG 24 hr capsule Take 30 mg by mouth every morning.        . budesonide-formoterol (SYMBICORT) 80-4.5 MCG/ACT inhaler Inhale 2 puffs into the lungs 2 (two) times daily.        Marland Kitchen Dexlansoprazole (DEXILANT PO) Take 60 mg by mouth.        . losartan-hydrochlorothiazide (HYZAAR) 50-12.5 MG per tablet Take 1 tablet by mouth daily.        Marland Kitchen amLODipine (NORVASC) 2.5 MG tablet Take 1 tablet (2.5 mg total) by mouth daily.  30 tablet  6  . metoprolol (TOPROL XL) 50 MG 24 hr tablet  Take 1.5 tablets (75 mg total) by mouth daily.  45 tablet  6    BP 142/74  Pulse 97  Ht 5' (1.524 m)  Wt 87.091 kg (192 lb)  BMI 37.50 kg/m2 General: NAD, overweight Neck: No JVD, no thyromegaly or thyroid nodule.  Lungs: Clear to auscultation bilaterally with normal respiratory effort. CV: Nondisplaced PMI.  Heart regular S1/S2, no S3/S4, no murmur.  No peripheral edema.  No carotid bruit.  Normal pedal pulses.  Abdomen: Soft, nontender, no hepatosplenomegaly, no distention.  Skin: Intact without lesions or rashes.  Neurologic: Alert and oriented x 3.  Psych: Normal affect. Extremities: No clubbing or cyanosis.  HEENT: Normal.

## 2011-06-06 NOTE — Assessment & Plan Note (Addendum)
Very severe episode of atypical chest pain earlier this month.  Has had prior chest pain episodes.  Cath in 3/11 showed no angiographic CAD.  CTA chest 11/12 showed no PE, no dissection, no PNA.  She is on Dexilant which seems to be suppressing GERD.  It is possible that she has coronary artery vasospasm given improvement with NTG.   I do not think that a stress test is necessary unless she develops frequent chest pain episodes.  I will start her on amlodipine 2.5 mg daily.  This could potentially help with vasospasm and will also lower BP.  I would also continue to avoid Adderall.

## 2011-06-06 NOTE — Assessment & Plan Note (Signed)
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  Event monitor has so far shown only mild sinus tachycardia.  She does not have orthostatic signs or symptoms so doubt POTS.  Continue event monitor, will try to get strips sent to this office.  I will have her stop metoprolol and start Toprol XL 75 mg daily for more even beta blockade (compared to short-acting metoprolol).

## 2011-06-19 ENCOUNTER — Ambulatory Visit (HOSPITAL_BASED_OUTPATIENT_CLINIC_OR_DEPARTMENT_OTHER): Payer: Medicare Other

## 2011-06-21 ENCOUNTER — Ambulatory Visit (INDEPENDENT_AMBULATORY_CARE_PROVIDER_SITE_OTHER): Payer: Medicare Other | Admitting: Cardiology

## 2011-06-21 ENCOUNTER — Encounter: Payer: Self-pay | Admitting: Cardiology

## 2011-06-21 DIAGNOSIS — R079 Chest pain, unspecified: Secondary | ICD-10-CM

## 2011-06-21 DIAGNOSIS — I10 Essential (primary) hypertension: Secondary | ICD-10-CM

## 2011-06-21 DIAGNOSIS — R002 Palpitations: Secondary | ICD-10-CM

## 2011-06-21 MED ORDER — AMLODIPINE BESYLATE 5 MG PO TABS: 5.0000 mg | ORAL_TABLET | Freq: Every day | ORAL | Status: DC

## 2011-06-21 NOTE — Patient Instructions (Signed)
Increase amlodipine to 5mg  daily. You can take two 2.5mg  tablets at the same time.  Your physician has requested that you have a renal artery duplex. During this test, an ultrasound is used to evaluate blood flow to the kidneys. Allow one hour for this exam. Do not eat after midnight the day before and avoid carbonated beverages. Take your medications as you usually do. 401.9  Your physician recommends that you collect a 24 hour urine for catecholamines and metanephrines  401.9  Your physician recommends that you schedule a follow-up appointment in: 6 weeks with Dr Shirlee Latch.

## 2011-06-23 NOTE — Assessment & Plan Note (Signed)
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  Event monitor showed only occasional mild sinus tachycardia.  She does not have orthostatic signs or symptoms so doubt POTS.  She is less symptomatic on Toprol XL.

## 2011-06-23 NOTE — Assessment & Plan Note (Signed)
No further chest pain episodes, recent negative myoview.  I think that the chest pain is likely noncardiac.

## 2011-06-23 NOTE — Progress Notes (Signed)
PCP: Dr. Nilda Simmer  45 yo with history of chest pain and palpitations presented initially for evaluation of both these symptoms.   Patient has a long history of atypical chest pain.  She actually had a cath in 3/11 showing no angiographic CAD.   Most recent chest pain episode was in November.  She had the onset of very severe substernal chest aching/crushing and came to the ER.  ECG was unchanged, 1 set of cardiac enzymes was negative, and CTA chest was negative for PE or dissection.  She was sent home from the ER.  Chest pain lasted about 1/2 hour.  She had a mild episode of chest pain the next morning but has had no further pain.    Patient additionally has episodes of palpitations where she feels her heart racing.  This is especially noticeable at night when she is lying down.  Heart rate gets as high as the 100s when she feels it racing.     She has been wearing an event monitor through another cardiology office.  This has shown episodes of sinus tachycardia but no other significant arrhythmias.  I did not think she needed a stress test, but she was afraid that she would be fined if she did not show up so she had an adenosine myoview at another cardiology office also.  As expected, this was normal.   BP is still running high at times.  She continues to feel her heart race, especially with exertion, but it has improved since starting Toprol XL.  She is having less palpitations now.   Labs (11/12): K 3.9, creatinine 0.7  PMH: 1. H/o DVT 2. Chest pain.  Left heart cath (3/11): no coronary disease.  CTA chest (11/12): No PE, no dissection.  Adenosine myoview (11/12): EF > 70%, no ischemia.  3. Appendectomy 4. Cholecystectomy 5. Multiple sclerosis: Remission for a number of years.  6. H/o hysterectomy 7. GERD 8. HTN 9. Echo (3/11): EF 55-60%, mild MR and AI 10. Tachycardia: ? Inappropriate sinus tachycardia.  Event monitor 11/12 showed sinus tachycardia, no other significant arrhythmias.    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   FH: HTN.  No premature CAD.   ROS: All systems reivewed and negative except as per HPI.   Current Outpatient Prescriptions  Medication Sig Dispense Refill  . albuterol (PROVENTIL HFA;VENTOLIN HFA) 108 (90 BASE) MCG/ACT inhaler Inhale 2 puffs into the lungs every 6 (six) hours as needed.        Marland Kitchen amphetamine-dextroamphetamine (ADDERALL XR) 30 MG 24 hr capsule Take 30 mg by mouth every morning.        . budesonide-formoterol (SYMBICORT) 80-4.5 MCG/ACT inhaler Inhale 2 puffs into the lungs 2 (two) times daily.        Marland Kitchen Dexlansoprazole (DEXILANT PO) Take 60 mg by mouth.        . losartan-hydrochlorothiazide (HYZAAR) 50-12.5 MG per tablet Take 1 tablet by mouth daily.        . metoprolol (TOPROL XL) 50 MG 24 hr tablet Take 1.5 tablets (75 mg total) by mouth daily.  45 tablet  6  . amLODipine (NORVASC) 5 MG tablet Take 1 tablet (5 mg total) by mouth daily.  30 tablet  6    BP 124/92  Pulse 80  Ht 5\' 1"  (1.549 m)  Wt 85.73 kg (189 lb)  BMI 35.71 kg/m2 General: NAD, overweight Neck: No JVD, no thyromegaly or thyroid nodule.  Lungs: Clear  to auscultation bilaterally with normal respiratory effort. CV: Nondisplaced PMI.  Heart regular S1/S2, no S3/S4, no murmur.  No peripheral edema.  No carotid bruit.  Normal pedal pulses.  Abdomen: Soft, nontender, no hepatosplenomegaly, no distention.  Skin: Intact without lesions or rashes.  Neurologic: Alert and oriented x 3.  Psych: Normal affect. Extremities: No clubbing or cyanosis.  HEENT: Normal.

## 2011-06-23 NOTE — Assessment & Plan Note (Signed)
BP still running high.  She is relatively young for HTN.  I think we do need to rule out secondary causes.  Given the runs of tachycardia, I think we should get a 24 hour urine collection for pheochromocytoma.  I will also get renal artery dopplers to look for evidence of renal artery stenosis from fibromuscular dysplasia. I will increase amlodipine to 5 mg daily.

## 2011-07-10 ENCOUNTER — Encounter: Payer: Self-pay | Admitting: Cardiology

## 2011-07-22 DIAGNOSIS — M545 Low back pain: Secondary | ICD-10-CM | POA: Diagnosis not present

## 2011-07-22 DIAGNOSIS — M542 Cervicalgia: Secondary | ICD-10-CM | POA: Diagnosis not present

## 2011-07-22 DIAGNOSIS — M999 Biomechanical lesion, unspecified: Secondary | ICD-10-CM | POA: Diagnosis not present

## 2011-07-22 DIAGNOSIS — M9981 Other biomechanical lesions of cervical region: Secondary | ICD-10-CM | POA: Diagnosis not present

## 2011-07-24 DIAGNOSIS — R05 Cough: Secondary | ICD-10-CM | POA: Diagnosis not present

## 2011-07-29 ENCOUNTER — Other Ambulatory Visit: Payer: Medicare Other | Admitting: *Deleted

## 2011-07-29 ENCOUNTER — Encounter (INDEPENDENT_AMBULATORY_CARE_PROVIDER_SITE_OTHER): Payer: Medicare Other | Admitting: Cardiology

## 2011-07-29 ENCOUNTER — Other Ambulatory Visit: Payer: Self-pay | Admitting: Cardiology

## 2011-07-29 DIAGNOSIS — M999 Biomechanical lesion, unspecified: Secondary | ICD-10-CM | POA: Diagnosis not present

## 2011-07-29 DIAGNOSIS — M542 Cervicalgia: Secondary | ICD-10-CM | POA: Diagnosis not present

## 2011-07-29 DIAGNOSIS — M545 Low back pain: Secondary | ICD-10-CM | POA: Diagnosis not present

## 2011-07-29 DIAGNOSIS — R002 Palpitations: Secondary | ICD-10-CM

## 2011-07-29 DIAGNOSIS — I10 Essential (primary) hypertension: Secondary | ICD-10-CM

## 2011-07-29 DIAGNOSIS — M9981 Other biomechanical lesions of cervical region: Secondary | ICD-10-CM | POA: Diagnosis not present

## 2011-07-31 DIAGNOSIS — R300 Dysuria: Secondary | ICD-10-CM | POA: Diagnosis not present

## 2011-07-31 DIAGNOSIS — M79609 Pain in unspecified limb: Secondary | ICD-10-CM | POA: Diagnosis not present

## 2011-07-31 DIAGNOSIS — J40 Bronchitis, not specified as acute or chronic: Secondary | ICD-10-CM | POA: Diagnosis not present

## 2011-07-31 DIAGNOSIS — R6889 Other general symptoms and signs: Secondary | ICD-10-CM | POA: Diagnosis not present

## 2011-07-31 DIAGNOSIS — N390 Urinary tract infection, site not specified: Secondary | ICD-10-CM | POA: Diagnosis not present

## 2011-08-01 ENCOUNTER — Encounter: Payer: Self-pay | Admitting: Cardiology

## 2011-08-01 ENCOUNTER — Ambulatory Visit (INDEPENDENT_AMBULATORY_CARE_PROVIDER_SITE_OTHER): Payer: Medicare Other | Admitting: Cardiology

## 2011-08-01 DIAGNOSIS — M9981 Other biomechanical lesions of cervical region: Secondary | ICD-10-CM | POA: Diagnosis not present

## 2011-08-01 DIAGNOSIS — M545 Low back pain: Secondary | ICD-10-CM | POA: Diagnosis not present

## 2011-08-01 DIAGNOSIS — R002 Palpitations: Secondary | ICD-10-CM

## 2011-08-01 DIAGNOSIS — I10 Essential (primary) hypertension: Secondary | ICD-10-CM | POA: Diagnosis not present

## 2011-08-01 DIAGNOSIS — M999 Biomechanical lesion, unspecified: Secondary | ICD-10-CM | POA: Diagnosis not present

## 2011-08-01 NOTE — Progress Notes (Signed)
Patient ID: [name], female   DOB: [birth year], 46 y.o.   MRN: [id number]

## 2011-08-01 NOTE — Patient Instructions (Signed)
Your physician wants you to follow-up in: 6 months with Dr McLean.(July 2013). You will receive a reminder letter in the mail two months in advance. If you don't receive a letter, please call our office to schedule the follow-up appointment.  

## 2011-08-02 NOTE — Progress Notes (Signed)
PCP: Dr. Nilda Simmer  46 yo with history of chest pain and palpitations presented initially for evaluation of both these symptoms.   Patient has a long history of atypical chest pain.  She actually had a cath in 3/11 showing no angiographic CAD.   Most recent chest pain episode was in November.  She had the onset of very severe substernal chest aching/crushing and came to the ER.  ECG was unchanged, 1 set of cardiac enzymes was negative, and CTA chest was negative for PE or dissection.  She was sent home from the ER.  Chest pain lasted about 1/2 hour.  She had a mild episode of chest pain the next morning but has had no further pain.    Patient additionally has episodes of palpitations where she feels her heart racing.  This is especially noticeable at night when she is lying down.  Heart rate gets as high as the 100s when she feels it racing.   The palpitations have improved with Toprol XL.  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    She wore an event monitor that showed episodes of sinus tachycardia but no other significant arrhythmias.  She had a myoview at another cardiology office that, as expected, was unremarkable.  BP is doing better.  Renal artery dopplers showed no evidence for fibromuscular dysplasia.    Labs (11/12): K 3.9, creatinine 0.7  PMH: 1. H/o DVT 2. Chest pain.  Left heart cath (3/11): no coronary disease.  CTA chest (11/12): No PE, no dissection.  Adenosine myoview (11/12): EF > 70%, no ischemia.  3. Appendectomy 4. Cholecystectomy 5. Multiple sclerosis: Remission for a number of years.  6. H/o hysterectomy 7. GERD 8. HTN: Renal artery dopplers (1/13) with no evidence for fibromuscular dysplasia.  9. Echo (3/11): EF 55-60%, mild MR and AI 10. Tachycardia: ? Inappropriate sinus tachycardia.  Event monitor 11/12 showed sinus tachycardia, no other significant arrhythmias.  11. 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   FH: HTN.  No premature CAD.     Current Outpatient Prescriptions  Medication Sig Dispense Refill  . albuterol (PROVENTIL HFA;VENTOLIN HFA) 108 (90 BASE) MCG/ACT inhaler Inhale 2 puffs into the lungs every 6 (six) hours as needed.        Marland Kitchen amLODipine (NORVASC) 5 MG tablet Take 1 tablet (5 mg total) by mouth daily.  30 tablet  6  . amphetamine-dextroamphetamine (ADDERALL XR) 30 MG 24 hr capsule Take 30 mg by mouth every morning.        . budesonide-formoterol (SYMBICORT) 80-4.5 MCG/ACT inhaler Inhale 2 puffs into the lungs 2 (two) times daily.        . cefdinir (OMNICEF) 300 MG capsule Take 300 mg by mouth 2 (two) times daily. As directed      . Dexlansoprazole (DEXILANT PO) Take 60 mg by mouth.        . losartan-hydrochlorothiazide (HYZAAR) 50-12.5 MG per tablet Take 1 tablet by mouth daily.        . metoprolol (TOPROL XL) 50 MG 24 hr tablet Take 1.5 tablets (75 mg total) by mouth daily.  45 tablet  6    BP 124/82  Pulse 100  Ht 5\' 1"  (1.549 m)  Wt 87.998 kg (194 lb)  BMI 36.66 kg/m2 General: NAD, overweight Neck: No JVD, no thyromegaly or thyroid nodule.  Lungs: Clear  to auscultation bilaterally with normal respiratory effort. CV: Nondisplaced PMI.  Heart regular S1/S2, no S3/S4, no murmur.  No peripheral edema.  No carotid bruit.  Normal pedal pulses.  Abdomen: Soft, nontender, no hepatosplenomegaly, no distention.  Skin: Intact without lesions or rashes.  Neurologic: Alert and oriented x 3.  Psych: Normal affect. Extremities: No clubbing or cyanosis.  HEENT: Normal.

## 2011-08-02 NOTE — Assessment & Plan Note (Signed)
BP under better control.  Continue current meds.  No evidence for fibromuscular dysplasia on renal artery dopplers.  Awaiting urine collection for catecholeamines to rule out pheochromocytoma.

## 2011-08-02 NOTE — Assessment & Plan Note (Signed)
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  Event monitor showed only occasional mild sinus tachycardia.  She does not have orthostatic signs or symptoms so doubt POTS.  She is less symptomatic on Toprol XL.  

## 2011-08-03 LAB — CATECHOLAMINES, FRACTIONATED, URINE, 24 HOUR
Calculated Total (E+NE): 54 mcg/24 h (ref 26–121)
Creatinine, Urine mg/day-CATEUR: 1.57 g/(24.h) (ref 0.63–2.50)
Dopamine, 24 hr Urine: 318 mcg/24 h (ref 52–480)
Total Volume - CF 24Hr U: 1700 mL

## 2011-09-04 DIAGNOSIS — L919 Hypertrophic disorder of the skin, unspecified: Secondary | ICD-10-CM | POA: Diagnosis not present

## 2011-09-04 DIAGNOSIS — D485 Neoplasm of uncertain behavior of skin: Secondary | ICD-10-CM | POA: Diagnosis not present

## 2011-09-04 DIAGNOSIS — D239 Other benign neoplasm of skin, unspecified: Secondary | ICD-10-CM | POA: Diagnosis not present

## 2011-09-04 DIAGNOSIS — L909 Atrophic disorder of skin, unspecified: Secondary | ICD-10-CM | POA: Diagnosis not present

## 2011-09-21 DIAGNOSIS — Z8719 Personal history of other diseases of the digestive system: Secondary | ICD-10-CM | POA: Diagnosis not present

## 2011-09-21 DIAGNOSIS — M76899 Other specified enthesopathies of unspecified lower limb, excluding foot: Secondary | ICD-10-CM | POA: Diagnosis not present

## 2011-09-21 DIAGNOSIS — M25569 Pain in unspecified knee: Secondary | ICD-10-CM | POA: Diagnosis not present

## 2011-09-26 DIAGNOSIS — IMO0002 Reserved for concepts with insufficient information to code with codable children: Secondary | ICD-10-CM | POA: Diagnosis not present

## 2011-10-02 DIAGNOSIS — M25569 Pain in unspecified knee: Secondary | ICD-10-CM | POA: Diagnosis not present

## 2011-10-02 DIAGNOSIS — IMO0002 Reserved for concepts with insufficient information to code with codable children: Secondary | ICD-10-CM | POA: Diagnosis not present

## 2011-10-07 DIAGNOSIS — Z23 Encounter for immunization: Secondary | ICD-10-CM | POA: Diagnosis not present

## 2011-10-07 DIAGNOSIS — I10 Essential (primary) hypertension: Secondary | ICD-10-CM | POA: Diagnosis not present

## 2011-10-07 DIAGNOSIS — F988 Other specified behavioral and emotional disorders with onset usually occurring in childhood and adolescence: Secondary | ICD-10-CM | POA: Diagnosis not present

## 2011-10-07 DIAGNOSIS — K219 Gastro-esophageal reflux disease without esophagitis: Secondary | ICD-10-CM | POA: Diagnosis not present

## 2011-10-09 DIAGNOSIS — M2240 Chondromalacia patellae, unspecified knee: Secondary | ICD-10-CM | POA: Diagnosis not present

## 2011-10-09 DIAGNOSIS — M999 Biomechanical lesion, unspecified: Secondary | ICD-10-CM | POA: Diagnosis not present

## 2011-10-09 DIAGNOSIS — M9981 Other biomechanical lesions of cervical region: Secondary | ICD-10-CM | POA: Diagnosis not present

## 2011-10-09 DIAGNOSIS — M545 Low back pain: Secondary | ICD-10-CM | POA: Diagnosis not present

## 2011-10-11 DIAGNOSIS — M545 Low back pain: Secondary | ICD-10-CM | POA: Diagnosis not present

## 2011-10-11 DIAGNOSIS — M999 Biomechanical lesion, unspecified: Secondary | ICD-10-CM | POA: Diagnosis not present

## 2011-10-11 DIAGNOSIS — M9981 Other biomechanical lesions of cervical region: Secondary | ICD-10-CM | POA: Diagnosis not present

## 2011-10-14 DIAGNOSIS — M999 Biomechanical lesion, unspecified: Secondary | ICD-10-CM | POA: Diagnosis not present

## 2011-10-14 DIAGNOSIS — M9981 Other biomechanical lesions of cervical region: Secondary | ICD-10-CM | POA: Diagnosis not present

## 2011-10-14 DIAGNOSIS — M545 Low back pain: Secondary | ICD-10-CM | POA: Diagnosis not present

## 2011-10-29 ENCOUNTER — Emergency Department (HOSPITAL_BASED_OUTPATIENT_CLINIC_OR_DEPARTMENT_OTHER)
Admission: EM | Admit: 2011-10-29 | Discharge: 2011-10-29 | Disposition: A | Discharge status: Home / Self Care | Payer: Medicare Other | Attending: Emergency Medicine | Admitting: Emergency Medicine

## 2011-10-29 ENCOUNTER — Telehealth: Payer: Self-pay | Admitting: Cardiology

## 2011-10-29 ENCOUNTER — Encounter (HOSPITAL_BASED_OUTPATIENT_CLINIC_OR_DEPARTMENT_OTHER): Payer: Self-pay

## 2011-10-29 ENCOUNTER — Emergency Department (INDEPENDENT_AMBULATORY_CARE_PROVIDER_SITE_OTHER): Payer: Medicare Other

## 2011-10-29 DIAGNOSIS — K219 Gastro-esophageal reflux disease without esophagitis: Secondary | ICD-10-CM | POA: Insufficient documentation

## 2011-10-29 DIAGNOSIS — R0789 Other chest pain: Secondary | ICD-10-CM | POA: Insufficient documentation

## 2011-10-29 DIAGNOSIS — M549 Dorsalgia, unspecified: Secondary | ICD-10-CM | POA: Insufficient documentation

## 2011-10-29 DIAGNOSIS — R000 Tachycardia, unspecified: Secondary | ICD-10-CM | POA: Diagnosis not present

## 2011-10-29 DIAGNOSIS — R110 Nausea: Secondary | ICD-10-CM | POA: Insufficient documentation

## 2011-10-29 DIAGNOSIS — I10 Essential (primary) hypertension: Secondary | ICD-10-CM | POA: Diagnosis not present

## 2011-10-29 DIAGNOSIS — R079 Chest pain, unspecified: Secondary | ICD-10-CM

## 2011-10-29 DIAGNOSIS — G35 Multiple sclerosis: Secondary | ICD-10-CM | POA: Insufficient documentation

## 2011-10-29 DIAGNOSIS — F909 Attention-deficit hyperactivity disorder, unspecified type: Secondary | ICD-10-CM | POA: Insufficient documentation

## 2011-10-29 DIAGNOSIS — R61 Generalized hyperhidrosis: Secondary | ICD-10-CM | POA: Insufficient documentation

## 2011-10-29 LAB — BASIC METABOLIC PANEL
Chloride: 104 mEq/L (ref 96–112)
GFR calc Af Amer: >90 mL/min (ref >90)
GFR calc non Af Amer: 89 mL/min — ABNORMAL LOW (ref >90)
Potassium: 3.6 mEq/L (ref 3.5–5.1)
Sodium: 141 mEq/L (ref 135–145)

## 2011-10-29 LAB — DIFFERENTIAL
Basophils Relative: 0 % (ref 0–1)
Eosinophils Absolute: 0.2 10*3/uL (ref 0.0–0.7)
Lymphs Abs: 2.7 10*3/uL (ref 0.7–4.0)
Neutro Abs: 7.1 10*3/uL (ref 1.7–7.7)
Neutrophils Relative %: 66 % (ref 43–77)

## 2011-10-29 LAB — TROPONIN I: Troponin I: <0.3 ng/mL (ref <0.30)

## 2011-10-29 LAB — CBC
MCH: 30.8 pg (ref 26.0–34.0)
MCHC: 34.7 g/dL (ref 30.0–36.0)
Platelets: 272 10*3/uL (ref 150–400)
RBC: 4.84 MIL/uL (ref 3.87–5.11)

## 2011-10-29 MED ORDER — IOHEXOL 350 MG/ML SOLN: 80.0000 mL | Freq: Once | INTRAVENOUS | Status: DC | PRN

## 2011-10-29 MED ORDER — ASPIRIN 81 MG PO CHEW
324.0000 mg | CHEWABLE_TABLET | Freq: Once | ORAL | Status: AC
  Administered 2011-10-29: 324 mg via ORAL
  Filled 2011-10-29: qty 4

## 2011-10-29 MED ORDER — MORPHINE SULFATE 4 MG/ML IJ SOLN
4.0000 mg | Freq: Once | INTRAMUSCULAR | Status: AC
  Administered 2011-10-29: 4 mg via INTRAVENOUS

## 2011-10-29 MED ORDER — MORPHINE SULFATE 4 MG/ML IJ SOLN
4.0000 mg | Freq: Once | INTRAMUSCULAR | Status: AC
  Administered 2011-10-29: 4 mg via INTRAVENOUS
  Filled 2011-10-29: qty 1

## 2011-10-29 MED ORDER — SODIUM CHLORIDE 0.9 % IV BOLUS (SEPSIS)
1000.0000 mL | Freq: Once | INTRAVENOUS | Status: AC
  Administered 2011-10-29: 1000 mL via INTRAVENOUS

## 2011-10-29 MED ORDER — MORPHINE SULFATE 4 MG/ML IJ SOLN
INTRAMUSCULAR | Status: AC
  Administered 2011-10-29: 4 mg via INTRAVENOUS
  Filled 2011-10-29: qty 1

## 2011-10-29 NOTE — Telephone Encounter (Signed)
Spoke with pt. I have given her an appt to see Tereso Newcomer 10/30/11.

## 2011-10-29 NOTE — ED Provider Notes (Addendum)
History     CSN: [id number]  Arrival date & time 10/29/11  1191   First MD Initiated Contact with Patient 10/29/11 [phone]      Chief Complaint  Patient presents with  . Chest Pain    (Consider location/radiation/quality/duration/timing/severity/associated sxs/prior treatment) HPI  45yoF h/o VTE, long history of atypical chest pain, quiescent MS pw chest pain. Pt states that approx 50 minutes prior to arrival she woke up out of her sleep with 7/10 chest pain. Describes as sharp and twisting substernal pain radiating to her back. +Nausea without vomiting. +diaphoresis. States she felt nauseated prior to bedtime last night. Denies fever/chills/cough. Pain is pleuritic in nature. States it feels similar to episodes of cp in the past after which she had negative stress test (11/12). 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 H/o DVT several years ago not on anticoagulation-- was diagnosed during a hospital stay. She had a recent trip to [place] driving 10 hours each way, returned yesterday. No fmhx dissection/aneurysm. H/o HTN. Denies HLD, DM, fmhx CAD. She is not a smoker. Cardiology notes indicate that they believe her chest pain is non-ischemic in nature.   Per Cardiology notes:  PMH:  1. H/o DVT  2. Chest pain. Left heart cath (3/11): no coronary disease. CTA chest (11/12): No PE, no dissection. Adenosine myoview (11/12): EF > 70%, no ischemia.  3. Appendectomy  4. Cholecystectomy  5. Multiple sclerosis: Remission for a number of years.  6. H/o hysterectomy  7. GERD  8. HTN: Renal artery dopplers (1/13) with no evidence for fibromuscular dysplasia.  9. Echo (3/11): EF 55-60%, mild MR and AI  10. Tachycardia: ? Inappropriate sinus tachycardia. Event monitor 11/12 showed sinus tachycardia, no other significant arrhythmias.  11. ADHD    Past Medical History  Diagnosis Date  . Hypertension   . GERD (gastroesophageal  reflux disease)   . Fluid retention   . DVT (deep venous thrombosis)   . Celiac sprue     Past Surgical History  Procedure Date  . Abdominal hysterectomy   . Cholecystectomy   . Appendectomy   . Cardiac catheterization   . Tonsilectomy, adenoidectomy, bilateral myringotomy and tubes     Family History  Problem Relation Age of Onset  . Cancer Neg Hx     gyn    History  Substance Use Topics  . Smoking status: Never Smoker   . Smokeless tobacco: Not on file  . Alcohol Use: No    OB History    Grav Para Term Preterm Abortions TAB SAB Ect Mult Living                  Review of Systems  All other systems reviewed and are negative.  except as noted HPI   Allergies  Codeine; Erythromycin; Levofloxacin; Oxycodone hcl; and Telithromycin  Home Medications   Current Outpatient Rx  Name Route Sig Dispense Refill  . ALBUTEROL SULFATE HFA 108 (90 BASE) MCG/ACT IN AERS Inhalation Inhale 2 puffs into the lungs every 6 (six) hours as needed.      Marland Kitchen AMLODIPINE BESYLATE 5 MG PO TABS Oral Take 1 tablet (5 mg total) by mouth daily. 30 tablet 6  . AMPHETAMINE-DEXTROAMPHET ER 30 MG PO CP24 Oral Take 30 mg by mouth every morning.      . BUDESONIDE-FORMOTEROL FUMARATE 80-4.5 MCG/ACT IN AERO Inhalation Inhale 2 puffs into the lungs 2 (two) times daily.      Marland Kitchen CEFDINIR  300 MG PO CAPS Oral Take 300 mg by mouth 2 (two) times daily. As directed    . DEXILANT PO Oral Take 60 mg by mouth.     Marland Kitchen LOSARTAN POTASSIUM-HCTZ 50-12.5 MG PO TABS Oral Take 1 tablet by mouth daily.      Marland Kitchen METOPROLOL SUCCINATE ER 50 MG PO TB24 Oral Take 1.5 tablets (75 mg total) by mouth daily. 45 tablet 6    BP 130/58  Pulse 89  Temp(Src) 97.6 F (36.4 C) (Oral)  Resp 18  SpO2 100%  Physical Exam  Nursing note and vitals reviewed. Constitutional: She is oriented to person, place, and time. She appears well-developed.  HENT:  Head: Atraumatic.  Mouth/Throat: Oropharynx is clear and moist.  Eyes: Conjunctivae  and EOM are normal. Pupils are equal, round, and reactive to light.  Neck: Normal range of motion. Neck supple.  Cardiovascular: Normal rate, regular rhythm, normal heart sounds and intact distal pulses.   Pulmonary/Chest: Effort normal and breath sounds normal. No respiratory distress. She has no wheezes. She has no rales. She exhibits no tenderness.  Abdominal: Soft. She exhibits no distension. There is no tenderness. There is no rebound and no guarding.  Musculoskeletal: Normal range of motion. She exhibits no edema and no tenderness.  Neurological: She is alert and oriented to person, place, and time.  Skin: Skin is warm and dry. No rash noted.  Psychiatric: She has a normal mood and affect.    Date: 10/29/2011  Rate: 93  Rhythm: normal sinus rhythm  QRS Axis: normal  Intervals: normal  ST/T Wave abnormalities: normal  Conduction Disutrbances:none  Narrative Interpretation: min baseline artifact  Old EKG Reviewed: no significant change compared to EKG 05/17/2011   ED Course  Procedures (including critical care time)  Labs Reviewed  CBC - Abnormal; Notable for the following:    WBC 10.8 (*)    All other components within normal limits  BASIC METABOLIC PANEL - Abnormal; Notable for the following:    Glucose, Bld 109 (*)    GFR calc non Af Amer 89 (*)    All other components within normal limits  DIFFERENTIAL  TROPONIN I  TROPONIN I   Ct Angio Chest W/cm &/or Wo Cm  10/29/2011  *RADIOLOGY REPORT*  Clinical Data: Patient woke up with chest pain radiating into the upper back.  CT ANGIOGRAPHY CHEST  Technique:  Multidetector CT imaging of the chest using the standard protocol during bolus administration of intravenous contrast. Multiplanar reconstructed images including MIPs were obtained and reviewed to evaluate the vascular anatomy.  Contrast:  80 ml Omnipaque 350  Comparison: 05/17/2011  Findings: Technically adequate study with good opacification of the central and segmental  pulmonary arteries.  No focal filling defects.  No evidence of significant pulmonary embolus.  Small esophageal hiatal hernia.  Normal heart size.  Normal caliber thoracic aorta.  No dissection.  No significant lymphadenopathy in the chest.  The esophagus is decompressed.  No pleural effusions. No focal consolidation or interstitial changes in the lungs.  Mild dependent changes.  Airways appear patent.  No pneumothorax. Degenerative changes in the thoracic spine.  The diffuse fatty infiltration of the liver.  IMPRESSION: No evidence of significant pulmonary embolus.  Original Report Authenticated By: Marlon Pel, M.D.    1. Atypical chest pain     MDM   Presents with atypical chest pain/back pain. She is a TIMI 0. Her only risk factor for CAD is HTN, which has been controlled. Her pain  is atypical and chronic/intermittent. Last cath 2011 without CAD. Myoview per EPIC notes 12/12 normal. She is followed by cardiology for HTN and possible inappropriate sinus tachycardia. She does have h/o VTE. CT Angiogram negative for PE and dissection. ASA and morphine previously given. Will obtain second troponin at 0800. If negative, home. Pain is controlled at this time. Pt signed out to Dr. Anitra Lauth: f/u troponin.   Patient and friend updated re: care plan.    Forbes Cellar, MD 10/29/11 1610  Forbes Cellar, MD 10/29/11 9604  Forbes Cellar, MD 10/29/11 [phone]

## 2011-10-29 NOTE — Discharge Instructions (Signed)
Chest Pain (Nonspecific) Chest pain has many causes. Your pain could be caused by something serious, such as a heart attack or a blood clot in the lungs. It could also be caused by something less serious, such as a chest bruise or a virus. Follow up with your doctor. More lab tests or other studies may be needed to find the cause of your pain. Most of the time, nonspecific chest pain will improve within 2 to 3 days of rest and mild pain medicine. HOME CARE  For chest bruises, you may put ice on the sore area for 15 to 20 minutes, 3 to 4 times a day. Do this only if it makes you or your child feel better.   Put ice in a plastic bag.   Place a towel between the skin and the bag.   Rest for the next 2 to 3 days.   Go back to work if the pain improves.   See your doctor if the pain lasts longer than 1 to 2 weeks.   Only take medicine as told by your doctor.   Quit smoking if you smoke.  GET HELP RIGHT AWAY IF:   There is more pain or pain that spreads to the arm, neck, jaw, back, or belly (abdomen).   You or your child has shortness of breath.   You or your child coughs more than usual or coughs up blood.   You or your child has very bad back or belly pain, feels sick to his or her stomach (nauseous), or throws up (vomits).   You or your child has very bad weakness.   You or your child passes out (faints).   You or your child has a temperature by mouth above 102 F (38.9 C), not controlled by medicine.  Any of these problems may be serious and may be an emergency. Do not wait to see if the problems will go away. Get medical help right away. Call your local emergency services 911 in [place] Do not drive yourself to the hospital. MAKE SURE YOU:   Understand these instructions.   Will watch this condition.   Will get help right away if you or your child is not doing well or gets worse.  Document Released: 12/11/2007 Document Revised: 06/13/2011 Document Reviewed:  12/11/2007 ExitCare Patient Information 2012 [employer]  RESOURCE GUIDE  Dental Problems  Patients with Medicaid: [facility]                     [facility] [address]                                           [address] Phone:  [phone]                                                   Phone:  [phone]  If unable to pay or uninsured, contact:  [facility] or [facility] to become qualified for the adult dental clinic.  Chronic Pain Problems Contact [facility]  [phone] Patients need to be referred by their primary care doctor.  Insufficient Money for Medicine Contact [employer]:  call "211" or   [facility] 271-5999.  No Primary Care Doctor Call [facility]  832-8000 Other agencies that provide inexpensive medical care    [facility]  [phone]    [facility]  [phone]    [facility]  [phone]    [facility]  [phone]    [facility]  [phone]    [facility]  [phone]  Psychological Services [facility]  [phone] [facility]  [phone] [facility]   [phone] (emergency services [phone])  Abuse/Neglect [place] Child Abuse Hotline (336) 641-3795 [place] Child Abuse Hotline 800-378-5315 (After Hours)  Emergency Shelter [facility] (336) 271-5985  [facility] (336) 275-9566 [facility] (704) 372-4663  MRSA Hotline #:   832-7006    [place] [facility]  [facility] [place]  [employer]                           [facility] [address] [place]                     [address]         [address]  [place]                                               [place]                              [place] Phone:  [phone]                                   Phone:  [phone]                   Phone:  [phone]  [facility] Phone:  [phone]  [place] Child Abuse Hotline (336) 342-1394 (336) 342-3537 (After Hours)  

## 2011-10-29 NOTE — ED Notes (Signed)
Pt sts woke up with c/p radiating to back.

## 2011-10-29 NOTE — Telephone Encounter (Signed)
Pt was seen in er last night for chest pain and was told to see mclean tomorrow, told her he is osut unitl Monday, wants to know what to do? [phone]

## 2011-10-30 ENCOUNTER — Encounter: Payer: Self-pay | Admitting: *Deleted

## 2011-10-30 ENCOUNTER — Encounter: Payer: Self-pay | Admitting: Physician Assistant

## 2011-10-30 ENCOUNTER — Ambulatory Visit (INDEPENDENT_AMBULATORY_CARE_PROVIDER_SITE_OTHER): Payer: Medicare Other | Admitting: Physician Assistant

## 2011-10-30 DIAGNOSIS — I10 Essential (primary) hypertension: Secondary | ICD-10-CM | POA: Diagnosis not present

## 2011-10-30 DIAGNOSIS — R002 Palpitations: Secondary | ICD-10-CM

## 2011-10-30 DIAGNOSIS — R079 Chest pain, unspecified: Secondary | ICD-10-CM | POA: Diagnosis not present

## 2011-10-30 MED ORDER — NITROGLYCERIN 0.4 MG SL SUBL: 0.4000 mg | SUBLINGUAL_TABLET | SUBLINGUAL | Status: DC | PRN

## 2011-10-30 NOTE — Patient Instructions (Signed)
Your physician recommends that you schedule a follow-up appointment in: YOU WILL RECEIVE A LETTER TO MAKE AN APPT TO SEE DR. [facility] IN July  Your physician has recommended you make the following change in your medication: YOU HAVE BEEN GIVEN A PRESCRIPTION TODAY FOR NITROGLYCERIN 0.4 SL

## 2011-10-30 NOTE — Progress Notes (Signed)
[address] [place], [place]  [address] Phone: [phone] Fax:  [phone]  Date:  10/30/2011   Name:  [name]       DOB:  01/10/1966 MRN:  [id number]  PCP:  Nilda Simmer, MD, MD  Primary Cardiologist:  Dr. Marca Ancona  Primary Electrophysiologist:  None    History of Present Illness: [name] is a 46 y.o. female who presents for follow up after a trip to the ED.  She has a long history of atypical chest pain.  Underwent left heart cath in 3/11 with Dr. Allyson Sabal of [facility] showing no angiographic CAD.  In November 2012, had onset of very severe substernal chest aching/crushing and came to the ER. ECG was unchanged, 1 set of cardiac enzymes was negative, and CTA chest was negative for PE or dissection.  Chest pain lasted about 1/2 hour.  Additionally has had episodes of palpitations where she feels her heart racing.  This has especially been noticeable at night when she is lying down. Heart rate had gone as high as the 100s when she feels it racing. The palpitations improved with Toprol XL.   She is on Adderall (in nursing school).   She wore an event monitor that showed episodes of sinus tachycardia but no other significant arrhythmias. She had a myoview at Dr. Hazle Coca office 11/12  that, as expected, was unremarkable.  Renal artery dopplers showed no evidence for fibromuscular dysplasia.  24 hr urine for catecholamines was normal in 1/13.  Last seen by Dr. Marca Ancona in 07/2011 with plans for 6 mo follow up.  Presented to ED 4/23 with chest pain.  Labs: K 3.6, creatinine 0.80, TnI neg x 2, WBC 10.8, Hgb 14.9, Chest CTA negative for pulmonary embolus.  She reports being under a great deal of stress.  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  Things got worse this week and she started to note increased HR and nausea.  She awoke yesterday morning with chest squeezing that lasted 1-1.5 hours.  No radiation.  No  assoc dyspnea.  She continued to feel nauseated.  No exertional chest pain or dyspnea. No orthopnea, PND, edema.  No syncope.  Feels much better today.  Situation with fellow student is being addressed.  She has regular follow up with GI and has had a recent EGD.     Past Medical History: 1. H/o DVT  2. Chest pain. Left heart cath (3/11): no coronary disease. CTA chest (11/12): No PE, no dissection. Adenosine myoview (11/12): EF > 70%, no ischemia;  ED 10/29/11: CT neg for PE, Tn neg x 2 3. Appendectomy  4. Cholecystectomy  5. Multiple sclerosis: Remission for a number of years.  6. H/o hysterectomy  7. GERD  8. HTN: Renal artery dopplers (1/13) with no evidence for fibromuscular dysplasia.  24 hr urine for VMA, catech, metaneph normal 1/13 9. Echo (3/11): EF 55-60%, mild MR and AI  10. Tachycardia: ? Inappropriate sinus tachycardia. Event monitor 11/12 showed sinus tachycardia, no other significant arrhythmias.  11. ADHD   Current Outpatient Prescriptions  Medication Sig Dispense Refill  . albuterol (PROVENTIL HFA;VENTOLIN HFA) 108 (90 BASE) MCG/ACT inhaler Inhale 2 puffs into the lungs every 6 (six) hours as needed.        Marland Kitchen amLODipine (NORVASC) 5 MG tablet Take 1 tablet (5 mg total) by mouth daily.  30 tablet  6  . amphetamine-dextroamphetamine (ADDERALL XR) 30  MG 24 hr capsule Take 30 mg by mouth every morning.        . budesonide-formoterol (SYMBICORT) 80-4.5 MCG/ACT inhaler Inhale 2 puffs into the lungs 2 (two) times daily.        Marland Kitchen Dexlansoprazole (DEXILANT PO) Take 60 mg by mouth.       . losartan-hydrochlorothiazide (HYZAAR) 50-12.5 MG per tablet Take 1 tablet by mouth daily.        . metoprolol (TOPROL XL) 50 MG 24 hr tablet Take 1.5 tablets (75 mg total) by mouth daily.  45 tablet  6    Allergies: Allergies  Allergen Reactions  . Codeine     REACTION: hallucinations  . Erythromycin     REACTION: seizures  . Levofloxacin     REACTION: hallucinations  . Oxycodone Hcl      REACTION: passed out  . Telithromycin Rash    Generic for Ketek    History  Substance Use Topics  . Smoking status: Never Smoker   . Smokeless tobacco: Not on file  . Alcohol Use: No     ROS:  Please see the history of present illness.   Occasional BRBPR - followed by GI and has had polypectomy in past.  All other systems reviewed and negative.   PHYSICAL EXAM: VS:  BP 128/82  Pulse 80  Ht 5\' 1"  (1.549 m)  Wt 197 lb (89.359 kg)  BMI 37.22 kg/m2 Well nourished, well developed, in no acute distress HEENT: normal Neck: no JVD Vasc: no carotid bruits Cardiac:  normal S1, S2; RRR; no murmur Lungs:  clear to auscultation bilaterally, no wheezing, rhonchi or rales Abd: soft, nontender, no hepatomegaly Ext: no edema Skin: warm and dry Neuro:  CNs 2-12 intact, no focal abnormalities noted Psych: normal affect  EKG:  NSR, HR 80, normal axis, no acute changes  ASSESSMENT AND PLAN:  1. Chest pain  Resolved.  With normal cath in 2011 and low risk myoview in 05/2011 and normal cardiac markers yesterday, would not pursue further cardiac workup.  CT was neg for PE.  There has apparently been some concern for vasospastic angina in the past.  No EKG changes noted on tracing yesterday.  Will give her a Rx for NTG to use PRN.  Differential includes GI cause of CP.  She has close follow up with Dr. Loreta Ave already.  Also, suspect anxiety plays a role.  She has been under a great deal of stress.  Suggest she pursue counseling first if symptoms continue.   2. HYPERTENSION  Controlled.  Continue current therapy.  Follow up with Dr. Marca Ancona as scheduled.   3. Palpitations  Controlled.       Luna Glasgow, PA-C  11:52 AM 10/30/2011

## 2011-12-09 DIAGNOSIS — K219 Gastro-esophageal reflux disease without esophagitis: Secondary | ICD-10-CM | POA: Diagnosis not present

## 2011-12-09 DIAGNOSIS — Z0184 Encounter for antibody response examination: Secondary | ICD-10-CM | POA: Diagnosis not present

## 2011-12-09 DIAGNOSIS — F909 Attention-deficit hyperactivity disorder, unspecified type: Secondary | ICD-10-CM | POA: Diagnosis not present

## 2011-12-09 DIAGNOSIS — K921 Melena: Secondary | ICD-10-CM | POA: Diagnosis not present

## 2011-12-09 DIAGNOSIS — I10 Essential (primary) hypertension: Secondary | ICD-10-CM | POA: Diagnosis not present

## 2011-12-26 ENCOUNTER — Other Ambulatory Visit: Payer: Self-pay | Admitting: Gastroenterology

## 2011-12-26 DIAGNOSIS — K589 Irritable bowel syndrome without diarrhea: Secondary | ICD-10-CM | POA: Diagnosis not present

## 2011-12-26 DIAGNOSIS — K625 Hemorrhage of anus and rectum: Secondary | ICD-10-CM | POA: Diagnosis not present

## 2011-12-26 DIAGNOSIS — R1011 Right upper quadrant pain: Secondary | ICD-10-CM | POA: Diagnosis not present

## 2011-12-26 DIAGNOSIS — R110 Nausea: Secondary | ICD-10-CM | POA: Diagnosis not present

## 2011-12-26 DIAGNOSIS — K602 Anal fissure, unspecified: Secondary | ICD-10-CM | POA: Diagnosis not present

## 2012-01-01 ENCOUNTER — Ambulatory Visit
Admission: RE | Admit: 2012-01-01 | Discharge: 2012-01-01 | Disposition: A | Discharge status: Home / Self Care | Payer: Medicare Other | Source: Ambulatory Visit | Attending: Gastroenterology | Admitting: Gastroenterology

## 2012-01-01 DIAGNOSIS — K7689 Other specified diseases of liver: Secondary | ICD-10-CM | POA: Diagnosis not present

## 2012-01-01 DIAGNOSIS — R1011 Right upper quadrant pain: Secondary | ICD-10-CM | POA: Diagnosis not present

## 2012-01-01 DIAGNOSIS — R7989 Other specified abnormal findings of blood chemistry: Secondary | ICD-10-CM | POA: Diagnosis not present

## 2012-01-03 ENCOUNTER — Ambulatory Visit: Payer: Medicare Other | Admitting: Cardiology

## 2012-01-06 DIAGNOSIS — J020 Streptococcal pharyngitis: Secondary | ICD-10-CM | POA: Diagnosis not present

## 2012-02-05 DIAGNOSIS — S91309A Unspecified open wound, unspecified foot, initial encounter: Secondary | ICD-10-CM | POA: Diagnosis not present

## 2012-02-05 DIAGNOSIS — W268XXA Contact with other sharp object(s), not elsewhere classified, initial encounter: Secondary | ICD-10-CM | POA: Diagnosis not present

## 2012-02-05 DIAGNOSIS — I959 Hypotension, unspecified: Secondary | ICD-10-CM | POA: Diagnosis not present

## 2012-02-05 DIAGNOSIS — Y92009 Unspecified place in unspecified non-institutional (private) residence as the place of occurrence of the external cause: Secondary | ICD-10-CM | POA: Diagnosis not present

## 2012-02-07 ENCOUNTER — Ambulatory Visit: Payer: Medicare Other | Admitting: Cardiology

## 2012-03-01 ENCOUNTER — Ambulatory Visit (INDEPENDENT_AMBULATORY_CARE_PROVIDER_SITE_OTHER): Payer: Medicare Other | Admitting: Family Medicine

## 2012-03-01 VITALS — BP 113/73 | HR 80 | Temp 98.1°F | Resp 16 | Ht 61.25 in | Wt 196.0 lb

## 2012-03-01 DIAGNOSIS — K219 Gastro-esophageal reflux disease without esophagitis: Secondary | ICD-10-CM

## 2012-03-01 DIAGNOSIS — R7302 Impaired glucose tolerance (oral): Secondary | ICD-10-CM

## 2012-03-01 DIAGNOSIS — K921 Melena: Secondary | ICD-10-CM

## 2012-03-01 DIAGNOSIS — H6090 Unspecified otitis externa, unspecified ear: Secondary | ICD-10-CM

## 2012-03-01 DIAGNOSIS — F430 Acute stress reaction: Secondary | ICD-10-CM

## 2012-03-01 DIAGNOSIS — F909 Attention-deficit hyperactivity disorder, unspecified type: Secondary | ICD-10-CM

## 2012-03-01 DIAGNOSIS — J45909 Unspecified asthma, uncomplicated: Secondary | ICD-10-CM

## 2012-03-01 DIAGNOSIS — R7309 Other abnormal glucose: Secondary | ICD-10-CM

## 2012-03-01 DIAGNOSIS — A6000 Herpesviral infection of urogenital system, unspecified: Secondary | ICD-10-CM

## 2012-03-01 DIAGNOSIS — D649 Anemia, unspecified: Secondary | ICD-10-CM

## 2012-03-01 DIAGNOSIS — I10 Essential (primary) hypertension: Secondary | ICD-10-CM | POA: Diagnosis not present

## 2012-03-01 DIAGNOSIS — Z91013 Allergy to seafood: Secondary | ICD-10-CM

## 2012-03-01 DIAGNOSIS — H6980 Other specified disorders of Eustachian tube, unspecified ear: Secondary | ICD-10-CM

## 2012-03-01 LAB — POCT CBC
Hemoglobin: 15.3 g/dL (ref 12.2–16.2)
Lymph, poc: 2.7 (ref 0.6–3.4)
MCH, POC: 29.8 pg (ref 27–31.2)
MCHC: 30.9 g/dL — AB (ref 31.8–35.4)
MCV: 96.3 fL (ref 80–97)
MPV: 9.6 fL (ref 0–99.8)
POC MID %: 6.6 %M (ref 0–12)
RBC: 5.14 M/uL (ref 4.04–5.48)
WBC: 9.2 10*3/uL (ref 4.6–10.2)

## 2012-03-01 LAB — COMPREHENSIVE METABOLIC PANEL
BUN: 12 mg/dL (ref 6–23)
CO2: 30 mEq/L (ref 19–32)
Calcium: 9.4 mg/dL (ref 8.4–10.5)
Chloride: 104 mEq/L (ref 96–112)
Creat: 0.81 mg/dL (ref 0.50–1.10)
Glucose, Bld: 110 mg/dL — ABNORMAL HIGH (ref 70–99)
Total Bilirubin: 0.5 mg/dL (ref 0.3–1.2)

## 2012-03-01 LAB — POCT GLYCOSYLATED HEMOGLOBIN (HGB A1C): Hemoglobin A1C: 5.4

## 2012-03-01 MED ORDER — EPINEPHRINE 0.3 MG/0.3ML IJ DEVI: 0.3000 mg | Freq: Once | INTRAMUSCULAR | Status: DC

## 2012-03-01 MED ORDER — VALACYCLOVIR HCL 500 MG PO TABS: 500.0000 mg | ORAL_TABLET | Freq: Two times a day (BID) | ORAL | Status: DC

## 2012-03-01 MED ORDER — DEXLANSOPRAZOLE 60 MG PO CPDR: DELAYED_RELEASE_CAPSULE | ORAL | Status: DC

## 2012-03-01 MED ORDER — OFLOXACIN 0.3 % OT SOLN: 10.0000 [drp] | Freq: Every day | OTIC | Status: AC

## 2012-03-01 MED ORDER — ALBUTEROL SULFATE (2.5 MG/3ML) 0.083% IN NEBU: 2.5000 mg | INHALATION_SOLUTION | Freq: Four times a day (QID) | RESPIRATORY_TRACT | Status: DC | PRN

## 2012-03-01 MED ORDER — AMOXICILLIN-POT CLAVULANATE 875-125 MG PO TABS: 1.0000 | ORAL_TABLET | Freq: Two times a day (BID) | ORAL | Status: AC

## 2012-03-01 MED ORDER — AMPHETAMINE-DEXTROAMPHETAMINE 20 MG PO TABS: ORAL_TABLET | ORAL | Status: DC

## 2012-03-01 MED ORDER — HYDROCHLOROTHIAZIDE 25 MG PO TABS: 25.0000 mg | ORAL_TABLET | Freq: Every day | ORAL | Status: DC

## 2012-03-01 NOTE — Patient Instructions (Addendum)
1. Anemia  POCT CBC  2. Hypertension  Comprehensive metabolic panel, hydrochlorothiazide (HYDRODIURIL) 25 MG tablet  3. Eustachian tube dysfunction    4. Otitis externa  ofloxacin (FLOXIN OTIC) 0.3 % otic solution, amoxicillin-clavulanate (AUGMENTIN) 875-125 MG per tablet  5. Bloody stools    6. Asthma  albuterol (PROVENTIL) (2.5 MG/3ML) 0.083% nebulizer solution  7. ADHD (attention deficit hyperactivity disorder)  amphetamine-dextroamphetamine (ADDERALL) 20 MG tablet  8. Stress reaction    9. 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 Shellfish allergy  EPINEPHrine (EPIPEN) 0.3 mg/0.3 mL DEVI  12. Glucose intolerance (impaired glucose tolerance)  POCT glycosylated hemoglobin (Hb A1C)

## 2012-03-01 NOTE — Progress Notes (Signed)
Subjective:    Patient ID: [name], female    DOB: [birth year], 46 y.o.   MRN: [id number]  HPIThis 46 y.o. female presents for evaluation of the following:  1.  Shellfish allergy: needs new Epipen.  Will get lip swelling with exposure to Betadine in hospital during nursing school rotations.  2.  GERD: needs Dexilant refill. Has two refills; only working if takes twice daily.  Dr. Loreta Ave has prescribed Dexilant bid in past.  Needs more due to overuse lately; increased stressors.  Has seen Dr. Loreta Ave over the summer. Has gained weight again.  3.  ADHD:  Has one remaining rx for Adderall 20mg .  Taking 20mg  Adderall daily; only uses 10mg  if needed.  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  5.  Stress:  Has Xanax rx; does not need refill today. Major family stressors this summer due to declining health of parents; has been coping well with stressors.  6.  HTN: HCTZ 25mg .  Had been removed from HCTZ by cardiology.  Metoprolol, Losartan, Amlodipine.  Spent most of summer in [place]; had a fall one night and cut leg; had NP evaluated in [place]; BP 70/50.  Stopped all medications except HCTZ.  Wanted pt to decrease HCTZ to 1/2 but taking one whole tablet.  BP was 70/50 on several visits.  Father is critically ill; very very sick.  Spent entire summer in [place].  Leaving again in one week to return to [place].  Father with leukemia.  Had lost 35 pounds.  Changed father's PCP.  Stage IV Hodgkin's lymphoma, PTX, pleural effusion.  Getting chemotherapy every other Monday. Tolerating chemotherapy.  Meets with oncologist next week.  Trying to figure out nursing school.  Having to have serious discussions with parents; also having to have real discussions with oncologist.  Anger and issues and medical providers.  Friend really thinks pt is taking care of self; coping well with stress.  Mother also had three stents placed cardiac.    7.  Rectal bleeding:   Has continued all summer; large amounts of blood in toilet with bowel movements.  Evaluated by Loreta Ave.  S/p colonoscopy 12/2010 three polyps incomplete; recommended repeating stool cards after colonoscopy.  Dr. Loreta Ave stated could not repeat colonoscopy for ten years.  Family history of colon cancer in young relatives.  Has now been constipated; intermittent rectal bleeding.  Mann diagnosed with anal fissure; prescribed cream; never got cream prescribed.  Two types of bleeding in colon ---  1. Blood mixed in the stool.  2.  Bright red blood in toilet.  Anemic while in [place]; worried about ongoing bleeding and lack of colonoscopy.   8.  Fall: fell when getting up in the middle of the night; fell and cut foot on bottom; presented to Urgent Care in [place].  Hgb approximately 9.0 in [place].   Rectal bleeding now intermittent instead of daily.    9. R ear pain: with strep throat, had R ear pain in 12/2011.  Ear pain resolved after strep throat. Has been taking Claritin daily but has gotten a little blood from ear.  Pain with laying on ear.  Very sensitive to noise.  Hearing normally; feels slightly closed.  Small amount of blood from R ear a few days ago.    10.  Blood sugar:   Nursing instructor worried about blood sugars.   Post-prandial sugars 130.  Fasting sugars 110.  11.  Asthma:  Requesting nebulizer.  Parents do not use air conditioning in [place]; asthma really worsened while in [place]; nebulizer seems to work much better than HFA for patient ;uses spacer with HFA.     Review of Systems  Constitutional: Negative for fever, chills, diaphoresis and fatigue.  HENT: Positive for ear pain and ear discharge. Negative for hearing loss, congestion, sore throat, rhinorrhea, mouth sores and postnasal drip.   Eyes: Negative for photophobia.  Respiratory: Positive for cough, shortness of breath and wheezing. Negative for chest tightness.   Cardiovascular: Positive for leg swelling. Negative for chest  pain and palpitations.  Gastrointestinal: Positive for constipation and blood in stool. Negative for nausea, vomiting, abdominal pain and diarrhea.  Genitourinary: Negative for genital sores and pelvic pain.  Skin: Negative for color change, rash and wound.  Neurological: Negative for dizziness, weakness, light-headedness, numbness and headaches.  Psychiatric/Behavioral: Positive for disturbed wake/sleep cycle and decreased concentration. Negative for behavioral problems and dysphoric mood. The patient is nervous/anxious.         Past Medical History  Diagnosis Date  . Hypertension   . GERD (gastroesophageal reflux disease)   . Fluid retention   . DVT (deep venous thrombosis)   . Celiac sprue     Past Surgical History  Procedure Date  . Abdominal hysterectomy   . Cholecystectomy   . Appendectomy   . Cardiac catheterization   . Tonsilectomy, adenoidectomy, bilateral myringotomy and tubes     Prior to Admission medications   Medication Sig Start Date End Date Taking? Authorizing Provider  albuterol (PROVENTIL HFA;VENTOLIN HFA) 108 (90 BASE) MCG/ACT inhaler Inhale 2 puffs into the lungs every 6 (six) hours as needed.     Yes Historical Provider, MD  ALPRAZolam Prudy Feeler) 0.5 MG tablet Take 0.5 mg by mouth as needed. .5 to 1 tab daily as needed   Yes Historical Provider, MD  amphetamine-dextroamphetamine (ADDERALL XR) 30 MG 24 hr capsule Take 20 mg by mouth every morning. Takes 20 mg   Yes Historical Provider, MD  budesonide-formoterol (SYMBICORT) 80-4.5 MCG/ACT inhaler Inhale 2 puffs into the lungs 2 (two) times daily.     Yes Historical Provider, MD  dexlansoprazole (DEXILANT) 60 MG capsule One tablet two times daily 03/01/12  Yes Ethelda Chick, MD  hydrochlorothiazide (HYDRODIURIL) 25 MG tablet Take 1 tablet (25 mg total) by mouth daily. 03/01/12  Yes Ethelda Chick, MD  loratadine (CLARITIN) 10 MG tablet Take 10 mg by mouth daily.   Yes Historical Provider, MD  metoprolol (TOPROL XL)  50 MG 24 hr tablet Take 1.5 tablets (75 mg total) by mouth daily. 06/05/11 06/04/12 Yes Laurey Morale, MD  naproxen sodium (ANAPROX) 550 MG tablet Take 550 mg by mouth 2 (two) times daily as needed.   Yes Historical Provider, MD  nitroGLYCERIN (NITROSTAT) 0.4 MG SL tablet Place 1 tablet (0.4 mg total) under the tongue every 5 (five) minutes as needed for chest pain. 10/30/11 10/29/12 Yes Beatrice Lecher, PA  valACYclovir (VALTREX) 500 MG tablet Take 1 tablet (500 mg total) by mouth 2 (two) times daily. Pt states she hasnt taken in 1.5 years 03/01/12  Yes Ethelda Chick, MD  albuterol (PROVENTIL) (2.5 MG/3ML) 0.083% nebulizer solution Take 3 mLs (2.5 mg total) by nebulization every 6 (six) hours as needed for wheezing. 03/01/12 03/01/13  Ethelda Chick, MD  amLODipine (NORVASC) 5 MG tablet Take 1 tablet (5 mg total) by mouth daily. 06/21/11 06/20/12  Laurey Morale, MD  amoxicillin-clavulanate (AUGMENTIN) 875-125 MG per tablet  Take 1 tablet by mouth 2 (two) times daily. 03/01/12 03/11/12  Ethelda Chick, MD  amphetamine-dextroamphetamine (ADDERALL) 20 MG tablet One tablet twice daily 03/01/12   Ethelda Chick, MD  EPINEPHrine (EPIPEN) 0.3 mg/0.3 mL DEVI Inject 0.3 mLs (0.3 mg total) into the muscle once. 03/01/12   Ethelda Chick, MD  losartan-hydrochlorothiazide (HYZAAR) 50-12.5 MG per tablet Take 1 tablet by mouth daily.      Historical Provider, MD  ofloxacin (FLOXIN OTIC) 0.3 % otic solution Place 10 drops into the right ear daily. 03/01/12 03/11/12  Ethelda Chick, MD    Allergies  Allergen Reactions  . Codeine     REACTION: hallucinations  . Erythromycin     REACTION: seizures  . Levofloxacin     REACTION: hallucinations  . Oxycodone Hcl     REACTION: passed out  . Telithromycin Rash    Generic for Ketek    History   Social History  . Marital Status: Single    Spouse Name: N/A    Number of Children: N/A  . Years of Education: N/A   Occupational History  . Not on file.   Social History  Main Topics  . Smoking status: Never Smoker   . Smokeless tobacco: Not on file  . Alcohol Use: No  . Drug Use: No  . Sexually Active:    Other Topics Concern  . Not on file   Social History Narrative  . No narrative on file    Family History  Problem Relation Age of Onset  . Cancer Neg Hx     gyn    Objective:   Physical Exam  Nursing note and vitals reviewed. Constitutional: She is oriented to person, place, and time. She appears well-developed and well-nourished. No distress.  HENT:  Head: Normocephalic and atraumatic.  Right Ear: External ear normal.  Left Ear: External ear normal.  Nose: Nose normal.  Mouth/Throat: Oropharynx is clear and moist.  Eyes: Conjunctivae and EOM are normal. Pupils are equal, round, and reactive to light.  Neck: Normal range of motion. Neck supple. No JVD present. No thyromegaly present.  Cardiovascular: Normal rate, regular rhythm, normal heart sounds and intact distal pulses.   No murmur heard. Pulmonary/Chest: Breath sounds normal. She is in respiratory distress. She has no wheezes. She has no rales.  Abdominal: Soft. Bowel sounds are normal. She exhibits no distension and no mass. There is no tenderness. There is no rebound and no guarding.  Lymphadenopathy:    She has no cervical adenopathy.  Neurological: She is alert and oriented to person, place, and time. She has normal reflexes. She exhibits normal muscle tone.  Skin: Skin is warm and dry. No rash noted. She is not diaphoretic. No erythema. No pallor.  Psychiatric: She has a normal mood and affect. Her behavior is normal. Judgment and thought content normal.     Results for orders placed in visit on 03/01/12  POCT CBC      Component Value Range   WBC 9.2  4.6 - 10.2 K/uL   Lymph, poc 2.7  0.6 - 3.4   POC LYMPH PERCENT 29.1  10 - 50 %L   MID (cbc) 0.6  0 - 0.9   POC MID % 6.6  0 - 12 %M   POC Granulocyte 5.9  2 - 6.9   Granulocyte percent 64.3  37 - 80 %G   RBC 5.14  4.04 -  5.48 M/uL   Hemoglobin 15.3  12.2 - 16.2  g/dL   HCT, POC 16.1 (*) 09.6 - 47.9 %   MCV 96.3  80 - 97 fL   MCH, POC 29.8  27 - 31.2 pg   MCHC 30.9 (*) 31.8 - 35.4 g/dL   RDW, POC 04.5     Platelet Count, POC 301  142 - 424 K/uL   MPV 9.6  0 - 99.8 fL  POCT GLYCOSYLATED HEMOGLOBIN (HGB A1C)      Component Value Range   Hemoglobin A1C 5.4       Assessment & Plan:   1. Anemia  POCT CBC  2. Hypertension  Comprehensive metabolic panel  3. Eustachian tube dysfunction    4. Otitis externa  ofloxacin (FLOXIN OTIC) 0.3 % otic solution, amoxicillin-clavulanate (AUGMENTIN) 875-125 MG per tablet  5. Bloody stools    6. Asthma  albuterol (PROVENTIL) (2.5 MG/3ML) 0.083% nebulizer solution  7. ADHD (attention deficit hyperactivity disorder)  amphetamine-dextroamphetamine (ADDERALL) 20 MG tablet  8. Stress reaction    9. 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 Shellfish allergy  EPINEPHrine (EPIPEN) 0.3 mg/0.3 mL DEVI  12. Glucose intolerance (impaired glucose tolerance)  POCT glycosylated hemoglobin (Hb A1C)    1.  Anemia: New.  Occurred this summer while in [place] and now resolved; obtain records from [place].   +rectal bleeding and bloody stools for past several months; s/p GI consultation; diagnosed with anal fissure and has not filled rx for topical cream.   2.  Hypertension: Controlled with over-correction this summer with hypotension.  No change in medications at this time; refill of HCTZ 25mg  daily provided.  Obtain labs.  If tachycardia recurs, will restart Metoprolol and decrease dose of HCTZ. 3.  Eustachian Tube Dysfunction:  Recurrent issue for patient; continue oral antihistamine daily; rx for Fluticasone nasal spray provided to use daily; frequent plane flights will exacerbate issue. 4.  R Otitis Externa:  New.  Mild.  Rx for Floxin Otic provided to use daily for one week.  5. Bloody  stools: Persistent.  S/p GI consult; diagnosed with anal fissure; advised to call GI office for rx to be sent to pharmacy again; did not fill rx after recent GI visit.  Also having blood intermixed in stool; treat anal fissure and reassess.  If blood in stool persists, follow up with GI. 6.  Asthma: worsening with heat and humidity in [place]; parents do not use air conditioning; rx for Albuterol nebulizer solution provided.  Will also refer for nebulizer machine. 7.  ADHD: controlled; refill x 1 provided of Adderall 20mg ; using Adderall 20mg  once daily but provided with bid dosing; thus, this rx should last two months. 8.  Stress Reaction: New.  Coping well with declining health of parents; using Xanax sparingly. 9.  GERD: worsening due to weight gain, stressors.  Agreeable to short-term RF of bid Dexilant; managed by GI/Mann. 10.  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 11.  Shellfish Allergy: stable; RF of Epipen. 12.  Glucose Intolerance: stable; HgbA1c normal but fasting sugars 100-110; recommend weight loss, exercise, low-sugar and low-carb food intake.   Meds ordered this encounter  Medications  . naproxen sodium (ANAPROX) 550 MG tablet    Sig: Take 550 mg by mouth 2 (two) times daily as needed.  . ALPRAZolam (XANAX) 0.5 MG tablet    Sig: Take 0.5 mg by mouth as needed. .5 to  1 tab daily as needed  . DISCONTD: valACYclovir (VALTREX) 500 MG tablet    Sig: Take 500 mg by mouth as needed. Pt states she hasnt taken in 1.5 years  . hydrochlorothiazide (HYDRODIURIL) 25 MG tablet    Sig: Take 25 mg by mouth daily.  Marland Kitchen loratadine (CLARITIN) 10 MG tablet    Sig: Take 10 mg by mouth daily.  Marland Kitchen EPINEPHrine (EPIPEN) 0.3 mg/0.3 mL DEVI    Sig: Inject 0.3 mLs (0.3 mg total) into the muscle once.    Dispense:  1 Device    Refill:  3  . dexlansoprazole (DEXILANT) 60 MG capsule    Sig: One tablet two times daily    Dispense:  60 capsule    Refill:   2  . amphetamine-dextroamphetamine (ADDERALL) 20 MG tablet    Sig: One tablet twice daily    Dispense:  60 tablet    Refill:  0  . valACYclovir (VALTREX) 500 MG tablet    Sig: Take 1 tablet (500 mg total) by mouth 2 (two) times daily. Pt states she hasnt taken in 1.5 years    Dispense:  30 tablet    Refill:  5  . ofloxacin (FLOXIN OTIC) 0.3 % otic solution    Sig: Place 10 drops into the right ear daily.    Dispense:  5 mL    Refill:  0  . amoxicillin-clavulanate (AUGMENTIN) 875-125 MG per tablet    Sig: Take 1 tablet by mouth 2 (two) times daily.    Dispense:  20 tablet    Refill:  0  . albuterol (PROVENTIL) (2.5 MG/3ML) 0.083% nebulizer solution    Sig: Take 3 mLs (2.5 mg total) by nebulization every 6 (six) hours as needed for wheezing.    Dispense:  75 mL    Refill:  12

## 2012-03-03 ENCOUNTER — Telehealth: Payer: Self-pay

## 2012-03-03 DIAGNOSIS — J45909 Unspecified asthma, uncomplicated: Secondary | ICD-10-CM

## 2012-03-03 NOTE — Telephone Encounter (Signed)
Pt wants to know when she will receive the neublizer Dr. Katrinka Blazing ordered for her.    Call back [phone]

## 2012-03-04 ENCOUNTER — Encounter: Payer: Self-pay | Admitting: Radiology

## 2012-03-04 DIAGNOSIS — J45909 Unspecified asthma, uncomplicated: Secondary | ICD-10-CM | POA: Insufficient documentation

## 2012-03-04 NOTE — Telephone Encounter (Signed)
Patient called back, she is advised I am not sure where or if this was sent for her. I have generated the order and faxed to [facility] for her, she is advised I will take care of this for her.

## 2012-03-04 NOTE — Telephone Encounter (Signed)
Left message for patient to call me back, unsure where she was to get the nebulizer, I need to clarify with patient.

## 2012-03-04 NOTE — Progress Notes (Signed)
Reviewed and agree.

## 2012-03-14 DIAGNOSIS — H6690 Otitis media, unspecified, unspecified ear: Secondary | ICD-10-CM | POA: Diagnosis not present

## 2012-03-18 DIAGNOSIS — M9981 Other biomechanical lesions of cervical region: Secondary | ICD-10-CM | POA: Diagnosis not present

## 2012-03-18 DIAGNOSIS — M999 Biomechanical lesion, unspecified: Secondary | ICD-10-CM | POA: Diagnosis not present

## 2012-03-19 ENCOUNTER — Telehealth: Payer: Self-pay

## 2012-03-19 DIAGNOSIS — R41840 Attention and concentration deficit: Secondary | ICD-10-CM

## 2012-03-19 NOTE — Telephone Encounter (Signed)
Pt called needs prescription Addererall revised, please call pt ASAP stated down to  Last pill. Patient of Dr Katrinka Blazing. If called tonight [phone] & if tomorrow cell [phone] and if  No answer leave msg. Pt states this is urgent.

## 2012-03-20 MED ORDER — AMPHETAMINE-DEXTROAMPHET ER 20 MG PO CP24: 20.0000 mg | ORAL_CAPSULE | ORAL | Status: DC

## 2012-03-20 NOTE — Telephone Encounter (Signed)
3. ADHD: Has one remaining rx for Adderall 20mg . Taking 20mg  Adderall daily; only uses 10mg  if needed.  I have left message for her to call me back.

## 2012-03-20 NOTE — Telephone Encounter (Signed)
Pt CB and stated that the Rx for her Adderall 20 mg was written incorrectly for the immed release and she takes the XR 20 mg. She has already p/up the incorrect Rx but can bring the bottle here and exchange it for the new Rx. Pt will be in Clinicals all day until 7:00 so she will send her roommate in to p/up new Rx and bring incorrect in. Pt was told that roommate will have to sign for Rx and show ID and is responsible for getting Rx to her. Pt agreed. Pt requests that we call her back at [phone] and leave her a detailed message as to when Rx is ready and if she needs to send in the incorrect medication.  Can we get the Rx written for pt for Adderall XR 20?

## 2012-03-20 NOTE — Telephone Encounter (Signed)
Rx for Adderall XR 20mg  one daily printed.  Patient can keep Rx for 20mg  Adderall and 1/2 to use in afternoons.  No history of abuse or misuse of Adderall. KMS

## 2012-03-20 NOTE — Telephone Encounter (Signed)
Notified pt that Rx is ready and she does not need to return the Imm Rel 20 mg as written by Dr Katrinka Blazing in phone message. Pt is sending her roommate, Norwood Levo to p/up Rx and she will sign for it.

## 2012-04-06 ENCOUNTER — Encounter: Payer: Self-pay | Admitting: *Deleted

## 2012-04-08 DIAGNOSIS — H9209 Otalgia, unspecified ear: Secondary | ICD-10-CM | POA: Diagnosis not present

## 2012-04-08 DIAGNOSIS — H6690 Otitis media, unspecified, unspecified ear: Secondary | ICD-10-CM | POA: Diagnosis not present

## 2012-04-08 DIAGNOSIS — H6980 Other specified disorders of Eustachian tube, unspecified ear: Secondary | ICD-10-CM | POA: Diagnosis not present

## 2012-04-09 ENCOUNTER — Encounter: Payer: Self-pay | Admitting: Family Medicine

## 2012-04-12 ENCOUNTER — Ambulatory Visit (INDEPENDENT_AMBULATORY_CARE_PROVIDER_SITE_OTHER): Payer: Medicare Other | Admitting: Family Medicine

## 2012-04-12 VITALS — BP 124/74 | HR 64 | Temp 98.5°F | Resp 16 | Ht 61.0 in | Wt 198.0 lb

## 2012-04-12 DIAGNOSIS — R748 Abnormal levels of other serum enzymes: Secondary | ICD-10-CM

## 2012-04-12 DIAGNOSIS — K7689 Other specified diseases of liver: Secondary | ICD-10-CM

## 2012-04-12 DIAGNOSIS — H6690 Otitis media, unspecified, unspecified ear: Secondary | ICD-10-CM

## 2012-04-12 DIAGNOSIS — M2550 Pain in unspecified joint: Secondary | ICD-10-CM | POA: Diagnosis not present

## 2012-04-12 DIAGNOSIS — K760 Fatty (change of) liver, not elsewhere classified: Secondary | ICD-10-CM | POA: Insufficient documentation

## 2012-04-12 LAB — COMPREHENSIVE METABOLIC PANEL
Albumin: 4.1 g/dL (ref 3.5–5.2)
BUN: 16 mg/dL (ref 6–23)
CO2: 26 mEq/L (ref 19–32)
Calcium: 8.9 mg/dL (ref 8.4–10.5)
Glucose, Bld: 91 mg/dL (ref 70–99)
Potassium: 4.3 mEq/L (ref 3.5–5.3)
Sodium: 141 mEq/L (ref 135–145)
Total Protein: 6.4 g/dL (ref 6.0–8.3)

## 2012-04-12 LAB — POCT CBC
Granulocyte percent: 62.4 %G (ref 37–80)
HCT, POC: 46.8 % (ref 37.7–47.9)
Hemoglobin: 15 g/dL (ref 12.2–16.2)
POC Granulocyte: 4.6 (ref 2–6.9)
RBC: 4.9 M/uL (ref 4.04–5.48)
RDW, POC: 12.9 %

## 2012-04-12 LAB — POCT SEDIMENTATION RATE: POCT SED RATE: 13 mm/hr (ref 0–22)

## 2012-04-12 LAB — URIC ACID: Uric Acid, Serum: 4.3 mg/dL (ref 2.4–7.0)

## 2012-04-12 LAB — C-REACTIVE PROTEIN: CRP: 1.3 mg/dL — ABNORMAL HIGH (ref <0.60)

## 2012-04-12 MED ORDER — PREDNISONE 10 MG PO TABS: ORAL_TABLET | ORAL | Status: DC

## 2012-04-12 MED ORDER — CEFDINIR 300 MG PO CAPS: 600.0000 mg | ORAL_CAPSULE | Freq: Every day | ORAL | Status: DC

## 2012-04-12 MED ORDER — NAPROXEN SODIUM 550 MG PO TABS: 550.0000 mg | ORAL_TABLET | Freq: Two times a day (BID) | ORAL | Status: DC | PRN

## 2012-04-12 NOTE — Progress Notes (Signed)
Subjective:    Patient ID: [name], female    DOB: [birth year], 46 y.o.   MRN: [id number]  HPI  Both big toes started hurting a wk ago and today both feet, and yesterday both hands started hurting.  She was in so much pain she couldn't even hold utensils yest to feed herself.  Couldn't hold the pen to fill out the forms, painful for her to walk at all. All joints feel swollen in all feet and ankles and now going up to knees as well as fingers and wrists. No h/o similar. No h/o gout but her father has gout so she was concerned that she had developed the "podagra."  No erythema or rashes.  Rt ear isn't draining, is going to get tubes as she is flying a lot.  Gets bloody discharge everytime she flies, and is going to fly again on Tuesday.   Her father has leukemia and is very ill so she is flying freq to be with him and hasn't had the time to have tubes placed.  Was last treated w/ amox which didn't help but felt like omnicef worked better for her.  Has chronic bunions since teen.  Has had some low grade fevers that accompanies flares of her chronic ear infections,   Yest had "worse HA of my life" treated w/ ibuprofen 3 tabs   Review of Systems  Constitutional: Positive for fever, chills, activity change and fatigue. Negative for appetite change.  HENT: Positive for ear pain and ear discharge. Negative for congestion, sore throat, rhinorrhea, neck pain, neck stiffness and sinus pressure.   Respiratory: Negative for cough and shortness of breath.   Cardiovascular: Negative for chest pain and leg swelling.  Gastrointestinal: Negative for nausea, vomiting, abdominal pain, diarrhea and constipation.  Musculoskeletal: Positive for myalgias, joint swelling, arthralgias and gait problem. Negative for back pain.  Skin: Negative for color change, pallor, rash and wound.  Neurological: Positive for headaches. Negative for tremors, weakness and numbness.  Hematological: Positive for adenopathy.        Past Medical History  Diagnosis Date  . Hypertension   . GERD (gastroesophageal reflux disease)   . Fluid retention   . DVT (deep venous thrombosis)   . Celiac sprue   . Attention deficit disorder with hyperactivity   . IBS (irritable bowel syndrome)   . Benign neoplasm of skin, site unspecified   . Allergic rhinitis, cause unspecified   . Optic neuritis, unspecified   . Eating disorder, unspecified   . Calculus of kidney   . Tachycardia, unspecified   . Benign neoplasm of colon   . Apnea   . Other chronic allergic conjunctivitis   . Multiple sclerosis   . Tremor   . Symptomatic states associated with artificial menopause   . Anxiety state, unspecified   . Genital herpes, unspecified   . Rosacea   . Unspecified asthma    BP 124/74  Pulse 64  Temp 98.5 F (36.9 C) (Oral)  Resp 16  Ht 5\' 1"  (1.549 m)  Wt 198 lb (89.812 kg)  BMI 37.41 kg/m2  Objective:   Physical Exam  Constitutional: She is oriented to person, place, and time. She appears well-developed and well-nourished. No distress.  HENT:  Head: Normocephalic and atraumatic.  Right Ear: External ear and ear canal normal. Tympanic membrane is retracted. Tympanic membrane is not erythematous. A middle ear effusion is present.  Left Ear: External ear and ear canal normal. Tympanic membrane is injected and retracted.  Nose: Nose normal.  Mouth/Throat: Oropharynx is clear and moist. No oropharyngeal exudate.  Eyes: Conjunctivae normal and EOM are normal. Pupils are equal, round, and reactive to light. Right eye exhibits no discharge. Left eye exhibits no discharge. No scleral icterus.  Neck: Normal range of motion. Neck supple. No thyromegaly present.  Cardiovascular: Normal rate, regular rhythm, normal heart sounds and intact distal pulses.   Pulmonary/Chest: Effort normal and breath sounds normal. No respiratory distress.  Musculoskeletal: Normal range of motion. She exhibits no edema and no tenderness.        Right hand: Normal.       Left hand: Normal.       Right foot: Normal.       Left foot: Normal.  Lymphadenopathy:       Head (right side): Submandibular adenopathy present.       Head (left side): Submandibular adenopathy present.    She has cervical adenopathy.       Right cervical: Superficial cervical adenopathy present.       Left cervical: Superficial cervical adenopathy present.  Neurological: She is alert and oriented to person, place, and time. She has normal reflexes. She exhibits normal muscle tone.  Skin: Skin is warm and dry. No rash noted. She is not diaphoretic. No erythema.  Psychiatric: She has a normal mood and affect. Her behavior is normal.            Results for orders placed in visit on 04/12/12  POCT SEDIMENTATION RATE      Component Value Range   POCT SED RATE 13  0 - 22 mm/hr  POCT CBC      Component Value Range   WBC 7.4  4.6 - 10.2 K/uL   Lymph, poc 2.3  0.6 - 3.4   POC LYMPH PERCENT 30.9  10 - 50 %L   MID (cbc) 0.5  0 - 0.9   POC MID % 6.7  0 - 12 %M   POC Granulocyte 4.6  2 - 6.9   Granulocyte percent 62.4  37 - 80 %G   RBC 4.90  4.04 - 5.48 M/uL   Hemoglobin 15.0  12.2 - 16.2 g/dL   HCT, POC 46.9  62.9 - 47.9 %   MCV 95.5  80 - 97 fL   MCH, POC 30.6  27 - 31.2 pg   MCHC 32.1  31.8 - 35.4 g/dL   RDW, POC 52.8     Platelet Count, POC 284  142 - 424 K/uL   MPV 9.8  0 - 99.8 fL    Assessment & Plan:  1. Acute polyarticular arthritis - Reassured w/ nml cbc and esr. Try trx w/ prednisone taper.  Check crp and uric acid though do not suspect gout. 2. Elev LFTs - Recheck. Likely due to fatty liver - cont low fat diet and avoid alcohol/tylenol. 3. Chronic OM - omnicef and f/u w/ ENT as prev arranged.

## 2012-04-13 ENCOUNTER — Encounter: Payer: Self-pay | Admitting: Family Medicine

## 2012-04-14 ENCOUNTER — Telehealth: Payer: Self-pay | Admitting: Radiology

## 2012-04-14 NOTE — Telephone Encounter (Signed)
Patient is going out of town this pm. She feels somewhat better, but wants to know lab results, I advised her of sed rate and liver functions, she is asking for follow up plan also, please advise and I will call her back. Amy

## 2012-04-14 NOTE — Telephone Encounter (Signed)
Lm for her to advise. She is instructed to call if anything further is needed.

## 2012-04-14 NOTE — Telephone Encounter (Signed)
Follow up as previously advised with Dr. Katrinka Blazing for routine medication refills and chronic disease management (usu every 3 mos or so.) things can be rechecked then if indicated.  If she has any additional joint pains or problems, she should return to clinic for further evaluation and work-up as soon as she is able.

## 2012-04-14 NOTE — Progress Notes (Signed)
Reviewed and agree.

## 2012-04-14 NOTE — Progress Notes (Signed)
I was reviewing the chart Dr. Clelia Croft and was interested as to the source of her cervical adenopathy. I saw she had a normal CBC and sedimentation rate but didn't know what the followup plan was if he wanted her to have a recheck of her adenopathy or just to let her let us know if the adenopathy persists. thanks

## 2012-04-15 ENCOUNTER — Ambulatory Visit: Payer: Medicare Other | Admitting: Family Medicine

## 2012-04-19 NOTE — Progress Notes (Signed)
I suspect pt's lymphadenopathy is stemming from her otitis media.  Fortunately, she does have an appt on 10/29 with Dr. Katrinka Blazing scheduled for a recheck.

## 2012-04-28 DIAGNOSIS — Z23 Encounter for immunization: Secondary | ICD-10-CM | POA: Diagnosis not present

## 2012-05-05 ENCOUNTER — Ambulatory Visit (INDEPENDENT_AMBULATORY_CARE_PROVIDER_SITE_OTHER): Payer: Medicare Other | Admitting: Family Medicine

## 2012-05-05 ENCOUNTER — Encounter: Payer: Self-pay | Admitting: Family Medicine

## 2012-05-05 VITALS — BP 112/76 | HR 81 | Temp 98.6°F | Resp 16 | Ht 61.0 in | Wt 195.2 lb

## 2012-05-05 DIAGNOSIS — K219 Gastro-esophageal reflux disease without esophagitis: Secondary | ICD-10-CM

## 2012-05-05 DIAGNOSIS — R41840 Attention and concentration deficit: Secondary | ICD-10-CM

## 2012-05-05 DIAGNOSIS — M2550 Pain in unspecified joint: Secondary | ICD-10-CM

## 2012-05-05 DIAGNOSIS — F909 Attention-deficit hyperactivity disorder, unspecified type: Secondary | ICD-10-CM

## 2012-05-05 DIAGNOSIS — R739 Hyperglycemia, unspecified: Secondary | ICD-10-CM | POA: Insufficient documentation

## 2012-05-05 DIAGNOSIS — R7309 Other abnormal glucose: Secondary | ICD-10-CM

## 2012-05-05 DIAGNOSIS — F430 Acute stress reaction: Secondary | ICD-10-CM

## 2012-05-05 DIAGNOSIS — H6690 Otitis media, unspecified, unspecified ear: Secondary | ICD-10-CM

## 2012-05-05 DIAGNOSIS — K921 Melena: Secondary | ICD-10-CM | POA: Insufficient documentation

## 2012-05-05 DIAGNOSIS — I10 Essential (primary) hypertension: Secondary | ICD-10-CM

## 2012-05-05 MED ORDER — AMPHETAMINE-DEXTROAMPHET ER 20 MG PO CP24: 20.0000 mg | ORAL_CAPSULE | ORAL | Status: DC

## 2012-05-05 MED ORDER — NAPROXEN SODIUM 550 MG PO TABS: 550.0000 mg | ORAL_TABLET | Freq: Two times a day (BID) | ORAL | Status: DC | PRN

## 2012-05-05 NOTE — Assessment & Plan Note (Signed)
New.  Last HgbA1c in 02/2012 normal; continue to monitor sugars once weekly.  Onset with Prednisone taper.  Recommend weight loss, exercise, low-sugar, low-carb intake.  Consistent with glucose intolerance.

## 2012-05-05 NOTE — Assessment & Plan Note (Signed)
Controlled; no change in management at this time.

## 2012-05-05 NOTE — Assessment & Plan Note (Signed)
Resolved since last visit

## 2012-05-05 NOTE — Patient Instructions (Addendum)
1. Attention deficit  amphetamine-dextroamphetamine (ADDERALL XR) 20 MG 24 hr capsule, DISCONTINUED: amphetamine-dextroamphetamine (ADDERALL XR) 20 MG 24 hr capsule, DISCONTINUED: amphetamine-dextroamphetamine (ADDERALL XR) 20 MG 24 hr capsule  2. Stress reaction    3. Hyperglycemia    4. Arthralgia    5. Essential hypertension, benign

## 2012-05-05 NOTE — Assessment & Plan Note (Addendum)
Controlled; no change in medications; refills x 3 of Adderall XR 20mg  provided. To use remaining Adderall 10-20mg  1/2 every afternoon as needed.  Follow-up in three months.

## 2012-05-05 NOTE — Assessment & Plan Note (Signed)
Persistent; coping well; continue Xanax PRN; using sparingly at this time.  Counseling provided during visit.

## 2012-05-05 NOTE — Assessment & Plan Note (Signed)
Uncontrolled yet has improved with dietary modifications.

## 2012-05-05 NOTE — Assessment & Plan Note (Addendum)
New to this provider; previous visit reviewed; normal ESR and uric acid levels; elevated CRP; if recurs, will warrant further autoimmune labs and referral to rheumatology; benign exam at this time.

## 2012-05-05 NOTE — Assessment & Plan Note (Signed)
New; scheduled for tube placement by ENT tomorrow.

## 2012-05-05 NOTE — Progress Notes (Signed)
[address]   [place], [place]  [address]   [phone]  Subjective:    Patient ID: [name], female    DOB: 1966/04/04, 46 y.o.   MRN: [id number]  HPIThis 46 y.o. female presents for evaluation of the following:  1.  Hand swelling, feet pain:  Acute onset of hand swelling, pain.  S/p evaluation by Dr. Clelia Croft; prescribed Prednisone with improvement.  Would like refill of Naproxen for recurrence.  ESR normal.  Not interested in further autoimmune labs at this time; if recurs, will be interested in further evaluation.  Hoping virally mediated process.  Feeling much better.  See last OV for further details.   2.  OM recurrent:  Suffering with recurrent otitis media due to frequent plane rides.  Evaluated by [facility] after last visit; referred to ENT for otitis media recurrent; getting tubes tomorrow.  S/p evaluation by ENT; Dr. Emeline Darling with [facility].  Nervous about procedure.  Balance is off after getting off of treadmill.    3. Elevated LFTs: repeat normal this month.   Does not understand why LFTs normalized.  4.  Elevated blood sugar: while on Prednisone for arthralgias, blood sugar elevated to 300.  Now 124, 85 fasting.  With previous iv Solumedrol with optic neuritis, sugar elevated then as well.   Has cut out sugar from diet.  Motivated to lose weight.    5. S/p flu vaccine at [facility].  Suffered with a lot of arm swelling.  Suffered with nausea; no fever, myalgias.  Arm was very painful.  Iced; improved in 24 hours.  [facility] deadline is May 06, 2012.   6. Medication refills:  Adderall XR 20mg , 5mg  PRN.  Needs refills.  Doing really well with current dose of Adderall.  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  Currently undergoing clinicals at [employer] and doing very well.   7.  Stress Reaction:  Father is half way through chemotherapy.  Was able to spend ten days with father.  Very difficult.  Crying a lot.  Twelve individual treatments.  Not sure when to take Xanax.  Able to  remain calm dealing with father's medicine/treatment.  Has returned to work.  Emotional roller coaster.  Flying back a lot.  Father is losing a lot of weight.   Having horrible pain.  Frustrated by navigating medical issues.  Oncologist has certain protocol.  Father's main concern is sleep.  Oncologist increased pain medication for  Nighttime sleep.  Oncologist does not believe in sleep aides.  Happy with choice she made to continue school.    8. HTN: blood pressure has been running really well lately; no recurrent low blood pressures; compliance with HCTZ; no longer taking Losartan or Amlodipine.  9. Bloody stools: no recurrent bloody stools since last visit.  10. GERD: persistent but has improved with attempts at weight loss and dietary changes.   Review of Systems  Constitutional: Negative for fever, chills, diaphoresis and fatigue.  HENT: Negative for ear pain, nosebleeds and congestion.   Respiratory: Negative for cough, shortness of breath and wheezing.   Cardiovascular: Negative for chest pain, palpitations and leg swelling.  Gastrointestinal: Negative for abdominal pain and blood in stool.  Musculoskeletal: Positive for joint swelling and arthralgias.  Skin: Negative for rash and wound.  Psychiatric/Behavioral: Positive for dysphoric mood. The patient is nervous/anxious.     Past Medical History  Diagnosis Date  . Hypertension   . GERD (gastroesophageal reflux disease)   . Fluid  retention   . DVT (deep venous thrombosis)   . Celiac sprue   . Attention deficit disorder with hyperactivity   . IBS (irritable bowel syndrome)   . Benign neoplasm of skin, site unspecified   . Allergic rhinitis, cause unspecified   . Optic neuritis, unspecified   . Eating disorder, unspecified   . Calculus of kidney   . Tachycardia, unspecified   . Benign neoplasm of colon   . Apnea   . Other chronic allergic conjunctivitis   . Multiple sclerosis   . Tremor   . Symptomatic states associated  with artificial menopause   . Anxiety state, unspecified   . Genital herpes, unspecified   . Rosacea   . Unspecified asthma     Past Surgical History  Procedure Date  . Abdominal hysterectomy 2008  . Cholecystectomy 2002  . Appendectomy 2005  . Cardiac catheterization 09/2009  . Tonsilectomy, adenoidectomy, bilateral myringotomy and tubes 1987  . Rotator cuff repair 1996    Right  . Nevus resection 06/2010    R upper back; L anterior chest 08/2011.    Prior to Admission medications   Medication Sig Start Date End Date Taking? Authorizing Provider  albuterol (PROVENTIL HFA;VENTOLIN HFA) 108 (90 BASE) MCG/ACT inhaler Inhale 2 puffs into the lungs every 6 (six) hours as needed.     Yes Historical Provider, MD  albuterol (PROVENTIL) (2.5 MG/3ML) 0.083% nebulizer solution Take 3 mLs (2.5 mg total) by nebulization every 6 (six) hours as needed for wheezing. 03/01/12 03/01/13 Yes Ethelda Chick, MD  ALPRAZolam Prudy Feeler) 0.5 MG tablet Take 0.5 mg by mouth as needed. .5 to 1 tab daily as needed   Yes Historical Provider, MD  amphetamine-dextroamphetamine (ADDERALL XR) 20 MG 24 hr capsule Take 1 capsule (20 mg total) by mouth every morning. DO NOT FILL UNTIL 07-05-12. 05/05/12  Yes Ethelda Chick, MD  amphetamine-dextroamphetamine (ADDERALL) 20 MG tablet One tablet twice daily 03/01/12  Yes Ethelda Chick, MD  budesonide-formoterol [facility]) 80-4.5 MCG/ACT inhaler Inhale 2 puffs into the lungs 2 (two) times daily.     Yes Historical Provider, MD  dexlansoprazole (DEXILANT) 60 MG capsule One tablet two times daily 03/01/12  Yes Ethelda Chick, MD  EPINEPHrine (EPIPEN) 0.3 mg/0.3 mL DEVI Inject 0.3 mLs (0.3 mg total) into the muscle once. 03/01/12  Yes Ethelda Chick, MD  hydrochlorothiazide (HYDRODIURIL) 25 MG tablet Take 1 tablet (25 mg total) by mouth daily. 03/01/12  Yes Ethelda Chick, MD  loratadine (CLARITIN) 10 MG tablet Take 10 mg by mouth daily.   Yes Historical Provider, MD    losartan-hydrochlorothiazide (HYZAAR) 50-12.5 MG per tablet Take 1 tablet by mouth daily.     Yes Historical Provider, MD  nitroGLYCERIN (NITROSTAT) 0.4 MG SL tablet Place 1 tablet (0.4 mg total) under the tongue every 5 (five) minutes as needed for chest pain. 10/30/11 10/29/12 Yes Beatrice Lecher, PA  valACYclovir (VALTREX) 500 MG tablet Take 1 tablet (500 mg total) by mouth 2 (two) times daily. Pt states she hasnt taken in 1.5 years 03/01/12  Yes Ethelda Chick, MD  amLODipine (NORVASC) 5 MG tablet Take 1 tablet (5 mg total) by mouth daily. 06/21/11 06/20/12  Laurey Morale, MD  cefdinir (OMNICEF) 300 MG capsule Take 2 capsules (600 mg total) by mouth daily. 04/12/12   Sherren Mocha, MD  metoprolol (TOPROL XL) 50 MG 24 hr tablet Take 1.5 tablets (75 mg total) by mouth daily. 06/05/11 06/04/12  Dalton  Alford Highland, MD  naproxen sodium (ANAPROX) 550 MG tablet Take 1 tablet (550 mg total) by mouth 2 (two) times daily as needed. 05/05/12   Ethelda Chick, MD  predniSONE (DELTASONE) 10 MG tablet 6 tabs po x 2d, 5 tabs po x 2d, 4 tabs po x 2d, 3 tabs po x 2d, 2 tabs po x 2d, 1 tabs po x2d 04/12/12   Sherren Mocha, MD    Allergies  Allergen Reactions  . Codeine     REACTION: hallucinations  . Erythromycin     REACTION: seizures  . Levofloxacin     REACTION: hallucinations  . Oxycodone Hcl     REACTION: passed out  . Telithromycin Rash    Generic for Ketek    History   Social History  . Marital Status: Divorced    Spouse Name: N/A    Number of Children: N/A  . Years of Education: college   Occupational History  . disabled     due to MS  . full time student     Nursing   Social History Main Topics  . Smoking status: Never Smoker   . Smokeless tobacco: Not on file  . Alcohol Use: No  . Drug Use: No  . Sexually Active:    Other Topics Concern  . Not on file   Social History Narrative   Divorced since 1998 after 1 year of marriage, first marriage. Caffeine use: carbonated beverage about 4  serving / day. Lives with best friend, son, best friend;s two sons in house.. Always uses seat belts. Exercise: Inactive.    Family History  Problem Relation Age of Onset  . Cancer Neg Hx     gyn  . Alcohol abuse Mother   . Diabetes Mother   . Coronary artery disease Mother     Carotid Artery DIsease  . Diabetes Father   . Hypertension Father   . Colon cancer         Objective:   Physical Exam  Nursing note and vitals reviewed. Constitutional: She is oriented to person, place, and time. She appears well-developed and well-nourished. No distress.  HENT:  Head: Normocephalic and atraumatic.  Right Ear: External ear normal. Tympanic membrane is retracted. Tympanic membrane is not injected, not perforated, not erythematous and not bulging. A middle ear effusion is present.  Left Ear: External ear normal. Tympanic membrane is not injected, not perforated, not erythematous, not retracted and not bulging. A middle ear effusion is present.  Nose: Nose normal.  Mouth/Throat: Oropharynx is clear and moist.  Eyes: Conjunctivae normal are normal. Pupils are equal, round, and reactive to light.  Neck: Normal range of motion. Neck supple. No thyromegaly present.  Cardiovascular: Normal rate, regular rhythm, normal heart sounds and intact distal pulses.   No murmur heard. Pulmonary/Chest: Effort normal and breath sounds normal. She has no wheezes. She has no rales.  Musculoskeletal:       Right hand: She exhibits normal range of motion, no tenderness, no bony tenderness and no swelling.       Left hand: She exhibits normal range of motion, no tenderness, no bony tenderness and no swelling.  Lymphadenopathy:    She has no cervical adenopathy.  Neurological: She is alert and oriented to person, place, and time. No cranial nerve deficit. She exhibits normal muscle tone.  Skin: She is not diaphoretic.  Psychiatric: She has a normal mood and affect. Her behavior is normal. Judgment and thought  content normal.  Assessment & Plan:   1. Attention deficit  amphetamine-dextroamphetamine (ADDERALL XR) 20 MG 24 hr capsule, DISCONTINUED: amphetamine-dextroamphetamine (ADDERALL XR) 20 MG 24 hr capsule, DISCONTINUED: amphetamine-dextroamphetamine (ADDERALL XR) 20 MG 24 hr capsule  2. Stress reaction    3. Hyperglycemia    4. Arthralgia    5. Essential hypertension, benign    6. GERD (gastroesophageal reflux disease)    7. Bloody stools

## 2012-05-06 DIAGNOSIS — H6690 Otitis media, unspecified, unspecified ear: Secondary | ICD-10-CM | POA: Diagnosis not present

## 2012-05-06 DIAGNOSIS — H6980 Other specified disorders of Eustachian tube, unspecified ear: Secondary | ICD-10-CM | POA: Diagnosis not present

## 2012-05-06 NOTE — Progress Notes (Signed)
Reviewed and agree.

## 2012-05-14 ENCOUNTER — Ambulatory Visit (INDEPENDENT_AMBULATORY_CARE_PROVIDER_SITE_OTHER): Payer: Medicare Other | Admitting: Family Medicine

## 2012-05-14 VITALS — BP 98/62 | HR 83 | Temp 98.5°F | Resp 16 | Ht 61.0 in | Wt 193.0 lb

## 2012-05-14 DIAGNOSIS — H9190 Unspecified hearing loss, unspecified ear: Secondary | ICD-10-CM

## 2012-05-14 DIAGNOSIS — H6690 Otitis media, unspecified, unspecified ear: Secondary | ICD-10-CM | POA: Diagnosis not present

## 2012-05-14 MED ORDER — AMOXICILLIN-POT CLAVULANATE 875-125 MG PO TABS: 1.0000 | ORAL_TABLET | Freq: Two times a day (BID) | ORAL | Status: DC

## 2012-05-14 MED ORDER — CIPROFLOXACIN HCL 0.2 % OT SOLN: 0.2000 mL | Freq: Two times a day (BID) | OTIC | Status: DC

## 2012-05-14 NOTE — Patient Instructions (Addendum)
1. Hearing loss    2. Otitis media  amoxicillin-clavulanate (AUGMENTIN) 875-125 MG per tablet, Ciprofloxacin HCl 0.2 % otic solution

## 2012-05-14 NOTE — Progress Notes (Signed)
[address]   [place], [place]  [address]   [phone]  Subjective:    Patient ID: KENDA KLOEHN, female    DOB: 18-Jan-1966, 46 y.o.   MRN: [id number]  HPIThis 46 y.o. female presents for evaluation of decreased hearing.  S/p myringotomy tube placement on 05/06/12.  Started running low grade fever 99.9 after placement.  Started on Polymyxin drops after surgery; ENT physician recommended stopping Polymyxin drops.  Unable to hear for several days especially L ear.  Recommended Advil, Tylenol.  Hearing not improving.  Taking Claritin bid, Benadryl qhs.  Still running low grade fever; preauricular glands swell at night.  Minimal pain at this time.  Hearing a fan or water.  B tube placement.  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  No drainage from ears.   Outer ear red and a little crusty.  No drainage on cotton.  No rhinorrhea, nasal congestion, headaches, sore throat, cough.  ENT/Mitchell Gore, MD.    Review of Systems  Constitutional: Positive for fever, chills and fatigue. Negative for diaphoresis.  HENT: Positive for hearing loss, ear pain and tinnitus. Negative for congestion, sore throat, rhinorrhea, sneezing, drooling, mouth sores, trouble swallowing, dental problem, voice change, postnasal drip, sinus pressure and ear discharge.   Respiratory: Negative for cough.   Hematological: Positive for adenopathy.    Past Medical History  Diagnosis Date  . Hypertension   . GERD (gastroesophageal reflux disease)   . Fluid retention   . DVT (deep venous thrombosis)   . Celiac sprue   . Attention deficit disorder with hyperactivity   . IBS (irritable bowel syndrome)   . Benign neoplasm of skin, site unspecified   . Allergic rhinitis, cause unspecified   . Optic neuritis, unspecified   . Eating disorder, unspecified   . Calculus of kidney   . Tachycardia, unspecified   . Benign neoplasm of colon   . Apnea   . Other chronic allergic conjunctivitis   . Multiple sclerosis   . Tremor   . Symptomatic  states associated with artificial menopause   . Anxiety state, unspecified   . Genital herpes, unspecified   . Rosacea   . Unspecified asthma     Past Surgical History  Procedure Date  . Abdominal hysterectomy 2008  . Cholecystectomy 2002  . Appendectomy 2005  . Cardiac catheterization 09/2009  . Tonsilectomy, adenoidectomy, bilateral myringotomy and tubes 1987  . Rotator cuff repair 1996    Right  . Nevus resection 06/2010    R upper back; L anterior chest 08/2011.    Prior to Admission medications   Medication Sig Start Date End Date Taking? Authorizing Provider  albuterol (PROVENTIL HFA;VENTOLIN HFA) 108 (90 BASE) MCG/ACT inhaler Inhale 2 puffs into the lungs every 6 (six) hours as needed.     Yes Historical Provider, MD  albuterol (PROVENTIL) (2.5 MG/3ML) 0.083% nebulizer solution Take 3 mLs (2.5 mg total) by nebulization every 6 (six) hours as needed for wheezing. 03/01/12 03/01/13 Yes Ethelda Chick, MD  ALPRAZolam Prudy Feeler) 0.5 MG tablet Take 0.5 mg by mouth as needed. .5 to 1 tab daily as needed   Yes Historical Provider, MD  amphetamine-dextroamphetamine (ADDERALL XR) 20 MG 24 hr capsule Take 1 capsule (20 mg total) by mouth every morning. DO NOT FILL UNTIL 07-05-12. 05/05/12  Yes Ethelda Chick, MD  amphetamine-dextroamphetamine (ADDERALL) 20 MG tablet One tablet twice daily 03/01/12  Yes Ethelda Chick, MD  budesonide-formoterol [facility]) 80-4.5 MCG/ACT inhaler Inhale 2 puffs into the  lungs 2 (two) times daily.     Yes Historical Provider, MD  cefdinir (OMNICEF) 300 MG capsule Take 2 capsules (600 mg total) by mouth daily. 04/12/12  Yes Sherren Mocha, MD  dexlansoprazole [facility]) 60 MG capsule One tablet two times daily 03/01/12  Yes Ethelda Chick, MD  EPINEPHrine (EPIPEN) 0.3 mg/0.3 mL DEVI Inject 0.3 mLs (0.3 mg total) into the muscle once. 03/01/12  Yes Ethelda Chick, MD  hydrochlorothiazide (HYDRODIURIL) 25 MG tablet Take 1 tablet (25 mg total) by mouth daily. 03/01/12  Yes  Ethelda Chick, MD  loratadine (CLARITIN) 10 MG tablet Take 10 mg by mouth daily.   Yes Historical Provider, MD  naproxen sodium (ANAPROX) 550 MG tablet Take 1 tablet (550 mg total) by mouth 2 (two) times daily as needed. 05/05/12  Yes Ethelda Chick, MD  nitroGLYCERIN (NITROSTAT) 0.4 MG SL tablet Place 1 tablet (0.4 mg total) under the tongue every 5 (five) minutes as needed for chest pain. 10/30/11 10/29/12 Yes Beatrice Lecher, PA  valACYclovir (VALTREX) 500 MG tablet Take 1 tablet (500 mg total) by mouth 2 (two) times daily. Pt states she hasnt taken in 1.5 years 03/01/12  Yes Ethelda Chick, MD  amoxicillin-clavulanate (AUGMENTIN) 875-125 MG per tablet Take 1 tablet by mouth 2 (two) times daily. 05/14/12   Ethelda Chick, MD  Ciprofloxacin HCl 0.2 % otic solution Place 0.2 mLs into both ears 2 (two) times daily. 05/14/12   Ethelda Chick, MD    Allergies  Allergen Reactions  . Codeine     REACTION: hallucinations  . Erythromycin     REACTION: seizures  . Levofloxacin     REACTION: hallucinations  . Oxycodone Hcl     REACTION: passed out  . Telithromycin Rash    Generic for Ketek    History   Social History  . Marital Status: Divorced    Spouse Name: N/A    Number of Children: N/A  . Years of Education: college   Occupational History  . disabled     due to MS  . full time student     Nursing   Social History Main Topics  . Smoking status: Never Smoker   . Smokeless tobacco: Never Used  . Alcohol Use: No  . Drug Use: No  . Sexually Active: Not Currently    Birth Control/ Protection: None   Other Topics Concern  . Not on file   Social History Narrative   Divorced since 1998 after 1 year of marriage, first marriage. Caffeine use: carbonated beverage about 4 serving / day. Lives with best friend, son, best friend;s two sons in house.. Always uses seat belts. Exercise: Inactive.    Family History  Problem Relation Age of Onset  . Cancer Neg Hx     gyn  . Alcohol abuse  Mother   . Diabetes Mother   . Coronary artery disease Mother     Carotid Artery DIsease  . Diabetes Father   . Hypertension Father   . Colon cancer          Objective:   Physical Exam  Nursing note and vitals reviewed. Constitutional: She is oriented to person, place, and time. She appears well-developed and well-nourished. No distress.  HENT:  Head: Normocephalic and atraumatic.  Right Ear: External ear and ear canal normal. No lacerations. No drainage, swelling or tenderness. No foreign bodies. No mastoid tenderness. No middle ear effusion. No hemotympanum.  Left Ear: External ear  normal. No lacerations. No drainage, swelling or tenderness. No foreign bodies. No mastoid tenderness. Tympanic membrane is injected and erythematous.  No middle ear effusion. No hemotympanum.  Nose: Nose normal.       B TM tubes in place; no active drainage.  L TM with erythema.  B TM with small effusion B.    Eyes: Conjunctivae normal are normal. Pupils are equal, round, and reactive to light.  Neck: Normal range of motion. Neck supple. No thyromegaly present.       +PRE-AURICULAR LAD B.  Lymphadenopathy:    She has no cervical adenopathy.  Neurological: She is alert and oriented to person, place, and time.  Skin: Skin is warm and dry. No rash noted. She is not diaphoretic.  Psychiatric: She has a normal mood and affect. Her behavior is normal.       Assessment & Plan:   1. Hearing loss    2. Otitis media  amoxicillin-clavulanate (AUGMENTIN) 875-125 MG per tablet, Ciprofloxacin HCl 0.2 % otic solution    1. Hearing Loss B:  Worsening after tube placement; anticipate improvement in upcoming 1-2 weeks.  If no improvement, recommend follow-up with ENT> 2. Otitis Media B:  rx for Augmentin, Ciprofloxacin otic drops.  Supportive care with rest, Tylenol. 2.  Meds ordered this encounter  Medications  . amoxicillin-clavulanate (AUGMENTIN) 875-125 MG per tablet    Sig: Take 1 tablet by mouth 2 (two)  times daily.    Dispense:  20 tablet    Refill:  0  . Ciprofloxacin HCl 0.2 % otic solution    Sig: Place 0.2 mLs into both ears 2 (two) times daily.    Dispense:  14 vial    Refill:  0

## 2012-05-27 DIAGNOSIS — M999 Biomechanical lesion, unspecified: Secondary | ICD-10-CM | POA: Diagnosis not present

## 2012-05-27 DIAGNOSIS — M9981 Other biomechanical lesions of cervical region: Secondary | ICD-10-CM | POA: Diagnosis not present

## 2012-05-29 DIAGNOSIS — R5381 Other malaise: Secondary | ICD-10-CM | POA: Diagnosis not present

## 2012-05-29 DIAGNOSIS — R5383 Other fatigue: Secondary | ICD-10-CM | POA: Diagnosis not present

## 2012-05-29 DIAGNOSIS — R079 Chest pain, unspecified: Secondary | ICD-10-CM | POA: Diagnosis not present

## 2012-06-02 ENCOUNTER — Ambulatory Visit (INDEPENDENT_AMBULATORY_CARE_PROVIDER_SITE_OTHER): Payer: Medicare Other | Admitting: Family Medicine

## 2012-06-02 ENCOUNTER — Encounter: Payer: Self-pay | Admitting: Family Medicine

## 2012-06-02 VITALS — BP 122/78 | HR 75 | Temp 97.9°F | Resp 16 | Ht 61.0 in | Wt 190.0 lb

## 2012-06-02 DIAGNOSIS — J189 Pneumonia, unspecified organism: Secondary | ICD-10-CM | POA: Diagnosis not present

## 2012-06-02 DIAGNOSIS — R509 Fever, unspecified: Secondary | ICD-10-CM

## 2012-06-02 LAB — CBC WITH DIFFERENTIAL/PLATELET
Basophils Absolute: 0 10*3/uL (ref 0.0–0.1)
Basophils Relative: 0 % (ref 0–1)
Eosinophils Relative: 2 % (ref 0–5)
Hemoglobin: 15.2 g/dL — ABNORMAL HIGH (ref 12.0–15.0)
Lymphocytes Relative: 33 % (ref 12–46)
MCH: 31 pg (ref 26.0–34.0)
MCV: 88 fL (ref 78.0–100.0)
Monocytes Absolute: 0.4 10*3/uL (ref 0.1–1.0)
Neutro Abs: 4 10*3/uL (ref 1.7–7.7)
Neutrophils Relative %: 59 % (ref 43–77)
RBC: 4.91 MIL/uL (ref 3.87–5.11)

## 2012-06-02 LAB — POCT INFLUENZA A/B: Influenza A, POC: NEGATIVE

## 2012-06-02 NOTE — Progress Notes (Signed)
[address]   [place], [place]  [address]   [phone]  Subjective:    Patient ID: [name], female    DOB: Mar 28, 1966, 46 y.o.   MRN: [id number]  HPIThis 46 y.o. female presents for evaluation of fever, cough, rib pain.  Onset eight days ago.  Onset with fever; Tmax 101.3; +sweats; +chills.  Sore throat but more gland pain/tenderness; throat is feeling better.  +exudate on tonsils/throat.  Throat swollen.  Really thought had strep throat.  Has been sleeping 14 hours per day.  Stayed in bed for 72 hours straight.  +malaise/+fatigue.  No rhinorrhea or nasal congestion; +sneezing.  +horrible headache.  Glands under arms tender.  No coughing; mild SOB with lying supine at night.  +R posterior back pain with deep breathing.  Tried chiropractor route without improvement.  Stress has been over the top lately.  +nausea but no vomiting, diarrhea.  Has been maintained on antibiotics since October. No ear pain.  Hearing is much better.  Went to [facility] four days ago; prescribed Omnicef for pneumonia per CXR.  Taking Ibuprofen or Tylenol.    Review of Systems  Constitutional: Positive for fever, chills and fatigue.  HENT: Positive for sneezing. Negative for hearing loss, ear pain, congestion, rhinorrhea and ear discharge.   Respiratory: Positive for shortness of breath. Negative for cough, wheezing and stridor.   Gastrointestinal: Positive for nausea. Negative for vomiting and diarrhea.  Skin: Negative for rash.  Neurological: Positive for headaches.     Past Medical History  Diagnosis Date  . Hypertension   . GERD (gastroesophageal reflux disease)   . Fluid retention   . DVT (deep venous thrombosis)   . Celiac sprue   . Attention deficit disorder with hyperactivity   . IBS (irritable bowel syndrome)   . Benign neoplasm of skin, site unspecified   . Allergic rhinitis, cause unspecified   . Optic neuritis, unspecified   . Eating disorder, unspecified   . Calculus of kidney   .  Tachycardia, unspecified   . Benign neoplasm of colon   . Apnea   . Other chronic allergic conjunctivitis   . Multiple sclerosis   . Tremor   . Symptomatic states associated with artificial menopause   . Anxiety state, unspecified   . Genital herpes, unspecified   . Rosacea   . Unspecified asthma     Past Surgical History  Procedure Date  . Abdominal hysterectomy 2008  . Cholecystectomy 2002  . Appendectomy 2005  . Cardiac catheterization 09/2009  . Tonsilectomy, adenoidectomy, bilateral myringotomy and tubes 1987  . Rotator cuff repair 1996    Right  . Nevus resection 06/2010    R upper back; L anterior chest 08/2011.    Prior to Admission medications   Medication Sig Start Date End Date Taking? Authorizing Provider  albuterol (PROVENTIL HFA;VENTOLIN HFA) 108 (90 BASE) MCG/ACT inhaler Inhale 2 puffs into the lungs every 6 (six) hours as needed.     Yes Historical Provider, MD  albuterol (PROVENTIL) (2.5 MG/3ML) 0.083% nebulizer solution Take 3 mLs (2.5 mg total) by nebulization every 6 (six) hours as needed for wheezing. 03/01/12 03/01/13 Yes Ethelda Chick, MD  ALPRAZolam Prudy Feeler) 0.5 MG tablet Take 0.5 mg by mouth as needed. .5 to 1 tab daily as needed   Yes Historical Provider, MD  amphetamine-dextroamphetamine (ADDERALL XR) 20 MG 24 hr capsule Take 1 capsule (20 mg total) by mouth every morning. DO NOT FILL UNTIL 07-05-12. 05/05/12  Yes Silva Bandy  Dwan Bolt, MD  amphetamine-dextroamphetamine (ADDERALL) 20 MG tablet One tablet twice daily 03/01/12  Yes Ethelda Chick, MD  budesonide-formoterol [facility]) 80-4.5 MCG/ACT inhaler Inhale 2 puffs into the lungs 2 (two) times daily.     Yes Historical Provider, MD  cefdinir (OMNICEF) 300 MG capsule Take 2 capsules (600 mg total) by mouth daily. 04/12/12  Yes Sherren Mocha, MD  dexlansoprazole [facility]) 60 MG capsule One tablet two times daily 03/01/12  Yes Ethelda Chick, MD  EPINEPHrine (EPIPEN) 0.3 mg/0.3 mL DEVI Inject 0.3 mLs (0.3 mg total)  into the muscle once. 03/01/12  Yes Ethelda Chick, MD  hydrochlorothiazide (HYDRODIURIL) 25 MG tablet Take 1 tablet (25 mg total) by mouth daily. 03/01/12  Yes Ethelda Chick, MD  loratadine (CLARITIN) 10 MG tablet Take 10 mg by mouth daily.   Yes Historical Provider, MD  naproxen sodium (ANAPROX) 550 MG tablet Take 1 tablet (550 mg total) by mouth 2 (two) times daily as needed. 05/05/12  Yes Ethelda Chick, MD  nitroGLYCERIN (NITROSTAT) 0.4 MG SL tablet Place 1 tablet (0.4 mg total) under the tongue every 5 (five) minutes as needed for chest pain. 10/30/11 10/29/12 Yes Beatrice Lecher, PA  valACYclovir (VALTREX) 500 MG tablet Take 1 tablet (500 mg total) by mouth 2 (two) times daily. Pt states she hasnt taken in 1.5 years 03/01/12  Yes Ethelda Chick, MD  amoxicillin-clavulanate (AUGMENTIN) 875-125 MG per tablet Take 1 tablet by mouth 2 (two) times daily. 05/14/12   Ethelda Chick, MD  Ciprofloxacin HCl 0.2 % otic solution Place 0.2 mLs into both ears 2 (two) times daily. 05/14/12   Ethelda Chick, MD    Allergies  Allergen Reactions  . Codeine     REACTION: hallucinations  . Erythromycin     REACTION: seizures  . Levofloxacin     REACTION: hallucinations  . Oxycodone Hcl     REACTION: passed out  . Telithromycin Rash    Generic for Ketek    History   Social History  . Marital Status: Divorced    Spouse Name: N/A    Number of Children: N/A  . Years of Education: college   Occupational History  . disabled     due to MS  . full time student     Nursing   Social History Main Topics  . Smoking status: Never Smoker   . Smokeless tobacco: Never Used  . Alcohol Use: No  . Drug Use: No  . Sexually Active: Not Currently    Birth Control/ Protection: None   Other Topics Concern  . Not on file   Social History Narrative   Divorced since 1998 after 1 year of marriage, first marriage. Caffeine use: carbonated beverage about 4 serving / day. Lives with best friend, son, best friend;s  two sons in house.. Always uses seat belts. Exercise: Inactive.    Family History  Problem Relation Age of Onset  . Cancer Neg Hx     gyn  . Alcohol abuse Mother   . Diabetes Mother   . Coronary artery disease Mother     Carotid Artery DIsease  . Diabetes Father   . Hypertension Father   . Colon cancer         Objective:   Physical Exam  Nursing note and vitals reviewed. Constitutional: She is oriented to person, place, and time. She appears well-developed and well-nourished.  Non-toxic appearance. She appears ill. No distress.  HENT:  Head:  Normocephalic and atraumatic.  Right Ear: External ear normal.  Left Ear: External ear normal.  Nose: Nose normal.  Mouth/Throat: Oropharynx is clear and moist.       TM TUBES VISUALIZED WITHOUT DRAINAGE.  Eyes: Conjunctivae normal and EOM are normal. Pupils are equal, round, and reactive to light.  Neck: Normal range of motion. Neck supple. No thyromegaly present.  Cardiovascular: Normal rate, regular rhythm and normal heart sounds.   No murmur heard. Pulmonary/Chest: Effort normal and breath sounds normal. No respiratory distress. She has no wheezes. She has no rales.  Lymphadenopathy:    She has cervical adenopathy.  Neurological: She is alert and oriented to person, place, and time.  Skin: Skin is warm and dry. No rash noted. She is not diaphoretic.  Psychiatric: She has a normal mood and affect. Her behavior is normal. Judgment and thought content normal.   Results for orders placed in visit on 06/02/12  POCT INFLUENZA A/B      Component Value Range   Influenza A, POC Negative     Influenza B, POC Negative         Assessment & Plan:   1. Fever  POCT Influenza A/B, CBC with Differential  2. Pneumonia       1. Fever:  New onset eight days ago.  Influenza swab negative; obtain CBC.  Obtain records from [facility] including CXR results.  Recommend follow-up in 5 days if fever > 100 persists.  Supportive care with rest,  fluids, Tylenol. 2.  Pneumonia:  New.  S/p CXR four days ago at [facility]; obtain records. Obtain CBC.  Influenza swab negative.  Continue Omnicef.   May warrant addition of Zithromax for atypical coverage.

## 2012-06-03 ENCOUNTER — Telehealth: Payer: Self-pay

## 2012-06-03 NOTE — Telephone Encounter (Signed)
PATIENT CALLING IN REGARDS TO FIND OUT LAB RESULTS FROM YESTERDAY. PLEASE CALL BACK IMMEDIATELY BECAUSE SHE WAS TOLD BY DR.SMITH THAT SHE WAS GOING TO GET A PHONE CALL.

## 2012-06-03 NOTE — Progress Notes (Signed)
Reviewed and agree.

## 2012-06-03 NOTE — Telephone Encounter (Signed)
Lab results sent to lab pool.  Please call pt back with results.  KMS

## 2012-06-03 NOTE — Telephone Encounter (Signed)
Gave pt results - see notes under lab results.

## 2012-06-03 NOTE — Telephone Encounter (Signed)
Please review labs. Pt inquiring. Thanks

## 2012-06-11 DIAGNOSIS — R079 Chest pain, unspecified: Secondary | ICD-10-CM | POA: Diagnosis not present

## 2012-06-11 DIAGNOSIS — B373 Candidiasis of vulva and vagina: Secondary | ICD-10-CM | POA: Diagnosis not present

## 2012-07-18 DIAGNOSIS — M9981 Other biomechanical lesions of cervical region: Secondary | ICD-10-CM | POA: Diagnosis not present

## 2012-07-18 DIAGNOSIS — M999 Biomechanical lesion, unspecified: Secondary | ICD-10-CM | POA: Diagnosis not present

## 2012-07-20 DIAGNOSIS — M999 Biomechanical lesion, unspecified: Secondary | ICD-10-CM | POA: Diagnosis not present

## 2012-07-20 DIAGNOSIS — M9981 Other biomechanical lesions of cervical region: Secondary | ICD-10-CM | POA: Diagnosis not present

## 2012-07-25 DIAGNOSIS — M9981 Other biomechanical lesions of cervical region: Secondary | ICD-10-CM | POA: Diagnosis not present

## 2012-07-25 DIAGNOSIS — M999 Biomechanical lesion, unspecified: Secondary | ICD-10-CM | POA: Diagnosis not present

## 2012-07-25 DIAGNOSIS — M545 Low back pain: Secondary | ICD-10-CM | POA: Diagnosis not present

## 2012-07-25 DIAGNOSIS — M546 Pain in thoracic spine: Secondary | ICD-10-CM | POA: Diagnosis not present

## 2012-07-27 DIAGNOSIS — M545 Low back pain: Secondary | ICD-10-CM | POA: Diagnosis not present

## 2012-07-27 DIAGNOSIS — M999 Biomechanical lesion, unspecified: Secondary | ICD-10-CM | POA: Diagnosis not present

## 2012-07-27 DIAGNOSIS — M546 Pain in thoracic spine: Secondary | ICD-10-CM | POA: Diagnosis not present

## 2012-07-27 DIAGNOSIS — M9981 Other biomechanical lesions of cervical region: Secondary | ICD-10-CM | POA: Diagnosis not present

## 2012-07-28 DIAGNOSIS — M545 Low back pain: Secondary | ICD-10-CM | POA: Diagnosis not present

## 2012-07-28 DIAGNOSIS — M9981 Other biomechanical lesions of cervical region: Secondary | ICD-10-CM | POA: Diagnosis not present

## 2012-07-28 DIAGNOSIS — M999 Biomechanical lesion, unspecified: Secondary | ICD-10-CM | POA: Diagnosis not present

## 2012-07-28 DIAGNOSIS — M546 Pain in thoracic spine: Secondary | ICD-10-CM | POA: Diagnosis not present

## 2012-07-30 DIAGNOSIS — R319 Hematuria, unspecified: Secondary | ICD-10-CM | POA: Diagnosis not present

## 2012-07-30 DIAGNOSIS — M545 Low back pain: Secondary | ICD-10-CM | POA: Diagnosis not present

## 2012-07-30 DIAGNOSIS — N3943 Post-void dribbling: Secondary | ICD-10-CM | POA: Diagnosis not present

## 2012-07-30 DIAGNOSIS — M4120 Other idiopathic scoliosis, site unspecified: Secondary | ICD-10-CM | POA: Diagnosis not present

## 2012-07-30 DIAGNOSIS — M47817 Spondylosis without myelopathy or radiculopathy, lumbosacral region: Secondary | ICD-10-CM | POA: Diagnosis not present

## 2012-07-30 DIAGNOSIS — R82998 Other abnormal findings in urine: Secondary | ICD-10-CM | POA: Diagnosis not present

## 2012-08-01 DIAGNOSIS — M545 Low back pain: Secondary | ICD-10-CM | POA: Diagnosis not present

## 2012-08-01 DIAGNOSIS — M999 Biomechanical lesion, unspecified: Secondary | ICD-10-CM | POA: Diagnosis not present

## 2012-08-01 DIAGNOSIS — M546 Pain in thoracic spine: Secondary | ICD-10-CM | POA: Diagnosis not present

## 2012-08-04 DIAGNOSIS — M545 Low back pain: Secondary | ICD-10-CM | POA: Diagnosis not present

## 2012-08-04 DIAGNOSIS — M999 Biomechanical lesion, unspecified: Secondary | ICD-10-CM | POA: Diagnosis not present

## 2012-08-04 DIAGNOSIS — M546 Pain in thoracic spine: Secondary | ICD-10-CM | POA: Diagnosis not present

## 2012-08-05 ENCOUNTER — Ambulatory Visit (INDEPENDENT_AMBULATORY_CARE_PROVIDER_SITE_OTHER): Payer: Medicare Other | Admitting: Family Medicine

## 2012-08-05 ENCOUNTER — Encounter: Payer: Self-pay | Admitting: Family Medicine

## 2012-08-05 VITALS — BP 136/84 | HR 85 | Temp 97.8°F | Resp 18 | Ht 61.5 in | Wt 199.0 lb

## 2012-08-05 DIAGNOSIS — F988 Other specified behavioral and emotional disorders with onset usually occurring in childhood and adolescence: Secondary | ICD-10-CM

## 2012-08-05 DIAGNOSIS — R41840 Attention and concentration deficit: Secondary | ICD-10-CM

## 2012-08-05 DIAGNOSIS — R319 Hematuria, unspecified: Secondary | ICD-10-CM | POA: Diagnosis not present

## 2012-08-05 DIAGNOSIS — R7309 Other abnormal glucose: Secondary | ICD-10-CM | POA: Diagnosis not present

## 2012-08-05 DIAGNOSIS — D649 Anemia, unspecified: Secondary | ICD-10-CM

## 2012-08-05 DIAGNOSIS — M4120 Other idiopathic scoliosis, site unspecified: Secondary | ICD-10-CM | POA: Diagnosis not present

## 2012-08-05 DIAGNOSIS — M5126 Other intervertebral disc displacement, lumbar region: Secondary | ICD-10-CM | POA: Diagnosis not present

## 2012-08-05 DIAGNOSIS — M545 Low back pain: Secondary | ICD-10-CM | POA: Diagnosis not present

## 2012-08-05 DIAGNOSIS — I10 Essential (primary) hypertension: Secondary | ICD-10-CM | POA: Diagnosis not present

## 2012-08-05 LAB — POCT URINALYSIS DIPSTICK
Ketones, UA: NEGATIVE
Leukocytes, UA: NEGATIVE
Nitrite, UA: NEGATIVE
Protein, UA: NEGATIVE
Urobilinogen, UA: 0.2
pH, UA: 7

## 2012-08-05 LAB — COMPREHENSIVE METABOLIC PANEL
Albumin: 4.3 g/dL (ref 3.5–5.2)
CO2: 27 mEq/L (ref 19–32)
Glucose, Bld: 109 mg/dL — ABNORMAL HIGH (ref 70–99)
Potassium: 4.1 mEq/L (ref 3.5–5.3)
Sodium: 140 mEq/L (ref 135–145)
Total Bilirubin: 0.5 mg/dL (ref 0.3–1.2)
Total Protein: 6.8 g/dL (ref 6.0–8.3)

## 2012-08-05 LAB — POCT CBC
Granulocyte percent: 35 %G — AB (ref 37–80)
HCT, POC: 44.4 % (ref 37.7–47.9)
MCV: 89 fL (ref 80–97)
POC LYMPH PERCENT: 56.7 %L — AB (ref 10–50)
RDW, POC: 13.9 %

## 2012-08-05 LAB — POCT UA - MICROSCOPIC ONLY
Casts, Ur, LPF, POC: NEGATIVE
Mucus, UA: POSITIVE
Yeast, UA: NEGATIVE

## 2012-08-05 MED ORDER — AMPHETAMINE-DEXTROAMPHET ER 20 MG PO CP24: 20.0000 mg | ORAL_CAPSULE | ORAL | Status: DC

## 2012-08-05 MED ORDER — AMPHETAMINE-DEXTROAMPHETAMINE 5 MG PO TABS: 5.0000 mg | ORAL_TABLET | Freq: Every day | ORAL | Status: DC

## 2012-08-05 NOTE — Progress Notes (Signed)
[address]   [place], [place]  [address]   [phone]  Subjective:    Patient ID: [name], female    DOB: [birth year], 47 y.o.   MRN: [id number]  HPIThis 47 y.o. female presents for evaluation three month follow-up:  1.  Low back pain:  Onset three weeks ago.  Presented to other Urgent Care due to back pain; s/p xrays; rx for Flexeril and Tramadol.  Had gone to chiropractor 9-10 times without improvement; going four times per week.  +onset of urgency after sitting supine.  Ran urinalysis.  Had unusual results.  +hematuria on labs; recommended repeat u/a.  S/p xrays at previous Urgent Care.  S/p consultation by [facility] this morning; concerned about herniated disc; scheduled for MRI Friday night   Follow-up with [facility] next Wednesday.  [facility] prescribed medication; Prednisone.  May warrant injection.  History of sacroiliitis injection ten years ago with great results.   Must attend twelve hours of ICU for next two days.  Alternating ice and heat.  +burning into leg intermittently.  Feels better with ambulation but prolonged standing worsens pain.  2.  Hematuria: at visit at other Urgent Care. Recommended repeat u/a today.  Performed two u/a at visit; first u/a negative for blood; second urine with large amount of blood.  Prescribed Bactrim on 07/30/12; called yesterday by Urgent Care; advised that Urine culture negative and to stop Bactrim.  No gross hematuria.  History of kidney stones but no recent colicky back pain, nausea, vomiting, frequency, urgency.  No fever/chills/sweats.  No flank pain.  No hesitancy, dysuria.  Urinary incontinence urge has continued after sitting position; Bactrim did not improve incontinence issue. No urinary retention.    3.  Otitis Media B: s/p tubes.  Hearing has returned to normal.  No further ear issues.    4.  Stress Reaction:  Father is good and bad; has completed chemotherapy.  Stressful trip over the holidays.  PET scan shows residual at original site L Lung  yet 98% resolved.  Now needs to recover and gain strength and weight.  Son getting married Nov 29, 2012.  Graduating Nov 12, 2012.  Three more examinations only.  In advanced med-surg currently.    5.   ADD: Adderall working moderately well at this time.  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    6.  HTN:  Up likely due to pain; checking at school; running stable.  Reading higher today.  Denies chest pain, palpitations, shortness of breath, leg swelling. Denies HA, dizziness, focal weakness, paresthesias other than burning down RLE.   7. Glucose Intolerance: last HgbA1c in 02/2012 normal.  No polyuria, polydipsia.  8.  Anemia: resolved at last visit; due for repeat labs.  9. . GERD: continues to be uncontrolled; unable to focus on weight loss or exercise due to acute lower back pain, schooling, stressors of father's cancer treatment.  No n/v/d/c; no bloody or black stools; no abdominal pain.     Review of Systems  Constitutional: Negative for fever, chills, diaphoresis and fatigue.  HENT: Negative for hearing loss and ear pain.   Respiratory: Negative for shortness of breath.   Cardiovascular: Negative for chest pain, palpitations and leg swelling.  Gastrointestinal: Negative for nausea, vomiting, abdominal pain, diarrhea, constipation, blood in stool and rectal pain.  Genitourinary: Negative for dysuria, urgency, frequency, hematuria, flank pain, decreased urine  volume, vaginal discharge and vaginal pain.  Musculoskeletal: Positive for myalgias and back pain.  Skin: Negative for rash.  Neurological: Negative for dizziness, tremors, seizures, syncope, facial asymmetry, speech difficulty, weakness, light-headedness, numbness and headaches.  Hematological: Negative for adenopathy. Does not bruise/bleed easily.  Psychiatric/Behavioral: Positive for decreased concentration.  Negative for suicidal ideas, sleep disturbance, self-injury and dysphoric mood. The patient is nervous/anxious.         Past Medical History  Diagnosis Date  . Hypertension   . GERD (gastroesophageal reflux disease)   . Fluid retention   . DVT (deep venous thrombosis)   . Celiac sprue   . Attention deficit disorder with hyperactivity   . IBS (irritable bowel syndrome)   . Benign neoplasm of skin, site unspecified   . Allergic rhinitis, cause unspecified   . Optic neuritis, unspecified   . Eating disorder, unspecified   . Calculus of kidney   . Tachycardia, unspecified   . Benign neoplasm of colon   . Apnea   . Other chronic allergic conjunctivitis   . Multiple sclerosis   . Tremor   . Symptomatic states associated with artificial menopause   . Anxiety state, unspecified   . Genital herpes, unspecified   . Rosacea   . Unspecified asthma     Past Surgical History  Procedure Date  . Abdominal hysterectomy 2008  . Cholecystectomy 2002  . Appendectomy 2005  . Cardiac catheterization 09/2009  . Tonsilectomy, adenoidectomy, bilateral myringotomy and tubes 1987  . Rotator cuff repair 1996    Right  . Nevus resection 06/2010    R upper back; L anterior chest 08/2011.    Prior to Admission medications   Medication Sig Start Date End Date Taking? Authorizing Provider  albuterol (PROVENTIL HFA;VENTOLIN HFA) 108 (90 BASE) MCG/ACT inhaler Inhale 2 puffs into the lungs every 6 (six) hours as needed.     Yes Historical Provider, MD  albuterol (PROVENTIL) (2.5 MG/3ML) 0.083% nebulizer solution Take 3 mLs (2.5 mg total) by nebulization every 6 (six) hours as needed for wheezing. 03/01/12 03/01/13 Yes Ethelda Chick, MD  ALPRAZolam Prudy Feeler) 0.5 MG tablet Take 0.5 mg by mouth as needed. .5 to 1 tab daily as needed   Yes Historical Provider, MD  amphetamine-dextroamphetamine (ADDERALL XR) 20 MG 24 hr capsule Take 1 capsule (20 mg total) by mouth every morning. DO NOT FILL UNTIL 07-05-12.  05/05/12  Yes Ethelda Chick, MD  budesonide-formoterol [facility]) 80-4.5 MCG/ACT inhaler Inhale 2 puffs into the lungs 2 (two) times daily.     Yes Historical Provider, MD  cyclobenzaprine (FLEXERIL) 10 MG tablet Take 10 mg by mouth 2 (two) times daily as needed.   Yes Historical Provider, MD  dexlansoprazole (DEXILANT) 60 MG capsule One tablet two times daily 03/01/12  Yes Ethelda Chick, MD  EPINEPHrine (EPIPEN) 0.3 mg/0.3 mL DEVI Inject 0.3 mLs (0.3 mg total) into the muscle once. 03/01/12  Yes Ethelda Chick, MD  hydrochlorothiazide (HYDRODIURIL) 25 MG tablet Take 1 tablet (25 mg total) by mouth daily. 03/01/12  Yes Ethelda Chick, MD  loratadine (CLARITIN) 10 MG tablet Take 10 mg by mouth daily.   Yes Historical Provider, MD  naproxen sodium (ANAPROX) 550 MG tablet Take 1 tablet (550 mg total) by mouth 2 (two) times daily as needed. 05/05/12  Yes Ethelda Chick, MD  nitroGLYCERIN (NITROSTAT) 0.4 MG SL tablet Place 1 tablet (0.4 mg total) under the tongue every 5 (five) minutes as needed for chest pain. 10/30/11  10/29/12 Yes Beatrice Lecher, PA  sulfamethoxazole-trimethoprim (BACTRIM DS) 800-160 MG per tablet Take 1 tablet by mouth 2 (two) times daily.   Yes Historical Provider, MD  traMADol (ULTRAM) 50 MG tablet Take 50 mg by mouth every 6 (six) hours as needed.   Yes Historical Provider, MD  valACYclovir (VALTREX) 500 MG tablet Take 1 tablet (500 mg total) by mouth 2 (two) times daily. Pt states she hasnt taken in 1.5 years 03/01/12  Yes Ethelda Chick, MD  amoxicillin-clavulanate (AUGMENTIN) 875-125 MG per tablet Take 1 tablet by mouth 2 (two) times daily. 05/14/12   Ethelda Chick, MD  amphetamine-dextroamphetamine (ADDERALL) 20 MG tablet One tablet twice daily 03/01/12   Ethelda Chick, MD  cefdinir (OMNICEF) 300 MG capsule Take 2 capsules (600 mg total) by mouth daily. 04/12/12   Sherren Mocha, MD  Ciprofloxacin HCl 0.2 % otic solution Place 0.2 mLs into both ears 2 (two) times daily. 05/14/12   Ethelda Chick, MD    Allergies  Allergen Reactions  . Codeine     REACTION: hallucinations  . Erythromycin     REACTION: seizures  . Levofloxacin     REACTION: hallucinations  . Oxycodone Hcl     REACTION: passed out  . Telithromycin Rash    Generic for Ketek    History   Social History  . Marital Status: Divorced    Spouse Name: N/A    Number of Children: N/A  . Years of Education: college   Occupational History  . disabled     due to MS  . full time student     Nursing   Social History Main Topics  . Smoking status: Never Smoker   . Smokeless tobacco: Never Used  . Alcohol Use: No  . Drug Use: No  . Sexually Active: Not Currently    Birth Control/ Protection: None   Other Topics Concern  . Not on file   Social History Narrative   Divorced since 1998 after 1 year of marriage, first marriage. Caffeine use: carbonated beverage about 4 serving / day. Lives with best friend, son, best friend;s two sons in house.. Always uses seat belts. Exercise: Inactive.    Family History  Problem Relation Age of Onset  . Cancer Neg Hx     gyn  . Alcohol abuse Mother   . Diabetes Mother   . Coronary artery disease Mother     Carotid Artery DIsease  . Diabetes Father   . Hypertension Father   . Colon cancer      Objective:   Physical Exam  Nursing note and vitals reviewed. Constitutional: She is oriented to person, place, and time. She appears well-developed and well-nourished. No distress.  HENT:  Right Ear: Hearing and tympanic membrane normal.  Left Ear: Hearing and tympanic membrane normal.  Mouth/Throat: Uvula is midline, oropharynx is clear and moist and mucous membranes are normal.       B tubes visualized.  Eyes: Conjunctivae normal are normal. Pupils are equal, round, and reactive to light.  Neck: Normal range of motion. Neck supple. No thyromegaly present.  Cardiovascular: Normal rate, regular rhythm and normal heart sounds.   Pulmonary/Chest: Effort normal and  breath sounds normal. She has no wheezes. She has no rales.  Abdominal: Soft. Bowel sounds are normal. She exhibits no distension. There is no tenderness. There is no rebound and no guarding.  Lymphadenopathy:    She has no cervical adenopathy.  Neurological: She is  alert and oriented to person, place, and time. No cranial nerve deficit. She exhibits normal muscle tone.  Skin: She is not diaphoretic.  Psychiatric: She has a normal mood and affect. Her behavior is normal. Judgment and thought content normal.       Results for orders placed in visit on 08/05/12  POCT URINALYSIS DIPSTICK      Component Value Range   Color, UA yellow     Clarity, UA slightly cloudy     Glucose, UA neg     Bilirubin, UA neg     Ketones, UA neg     Spec Grav, UA 1.020     Blood, UA neg     pH, UA 7.0     Protein, UA neg     Urobilinogen, UA 0.2     Nitrite, UA neg     Leukocytes, UA Negative    POCT UA - MICROSCOPIC ONLY      Component Value Range   WBC, Ur, HPF, POC 0-1     RBC, urine, microscopic 2-4     Bacteria, U Microscopic 1+     Mucus, UA pos     Epithelial cells, urine per micros neg     Crystals, Ur, HPF, POC neg     Casts, Ur, LPF, POC neg     Yeast, UA neg    POCT CBC      Component Value Range   WBC 3.0 (*) 4.6 - 10.2 K/uL   Lymph, poc 1.7  0.6 - 3.4   POC LYMPH PERCENT 56.7 (*) 10 - 50 %L   MID (cbc) 0.2  0 - 0.9   POC MID % 8.3  0 - 12 %M   POC Granulocyte 1.0 (*) 2 - 6.9   Granulocyte percent 35.0 (*) 37 - 80 %G   RBC 4.99  4.04 - 5.48 M/uL   Hemoglobin 14.2  12.2 - 16.2 g/dL   HCT, POC 40.9  81.1 - 47.9 %   MCV 89.0  80 - 97 fL   MCH, POC 28.5  27 - 31.2 pg   MCHC 32.0  31.8 - 35.4 g/dL   RDW, POC 91.4     Platelet Count, POC 268  142 - 424 K/uL   MPV 8.2  0 - 99.8 fL  POCT GLYCOSYLATED HEMOGLOBIN (HGB A1C)      Component Value Range   Hemoglobin A1C 5.2      Assessment & Plan:   1. Hematuria  POCT urinalysis dipstick, POCT UA - Microscopic Only, POCT UA -  Microscopic Only, POCT urinalysis dipstick  2. Low back pain radiating to right leg    3. Essential hypertension, benign  Comprehensive metabolic panel  4. Anemia, unspecified  POCT CBC  5. ADD (attention deficit disorder)    6. Other abnormal glucose  POCT glycosylated hemoglobin (Hb A1C)  7. Attention deficit  amphetamine-dextroamphetamine (ADDERALL XR) 20 MG 24 hr capsule, DISCONTINUED: amphetamine-dextroamphetamine (ADDERALL XR) 20 MG 24 hr capsule, DISCONTINUED: amphetamine-dextroamphetamine (ADDERALL XR) 20 MG 24 hr capsule     1. Hematuria:  New.  Now resolved.  Asymptomatic at this time.  Urine culture negative by pt report. 2.  Low back pain with radiculopathy: New.  S/p ortho consultation this morning; scheduled for MRI this week. 3.  HTN: controlled; obtain labs.  No change in medication. 4.  Anemia: resolved; will repeat labs today. 5.  ADD: controlled; refill of Adderall XR 20mg  daily x 3 months of RF; also provided  refill of Adderall 5mg  to take q afternoon PRN #30 no refills x 1 only.   6. Hyperglycemia: improved with normal HgbA1c in 02/2012 and now. 7. GERD: uncontrolled; recommend dietary modification, exercise, weight loss. Follow-up with GI if worsens.  Meds ordered this encounter  Medications  . cyclobenzaprine (FLEXERIL) 10 MG tablet    Sig: Take 10 mg by mouth 2 (two) times daily as needed.  . sulfamethoxazole-trimethoprim (BACTRIM DS) 800-160 MG per tablet    Sig: Take 1 tablet by mouth 2 (two) times daily.  . traMADol (ULTRAM) 50 MG tablet    Sig: Take 50 mg by mouth every 6 (six) hours as needed.  Marland Kitchen DISCONTD: amphetamine-dextroamphetamine (ADDERALL XR) 20 MG 24 hr capsule    Sig: Take 1 capsule (20 mg total) by mouth every morning. DO NOT FILL UNTIL 08-05-12.    Dispense:  30 capsule    Refill:  0  . DISCONTD: amphetamine-dextroamphetamine (ADDERALL XR) 20 MG 24 hr capsule    Sig: Take 1 capsule (20 mg total) by mouth every morning. DO NOT FILL UNTIL 02-29-14.      Dispense:  30 capsule    Refill:  0  . amphetamine-dextroamphetamine (ADDERALL XR) 20 MG 24 hr capsule    Sig: Take 1 capsule (20 mg total) by mouth every morning. DO NOT FILL UNTIL 10-03-12.    Dispense:  30 capsule    Refill:  0  . amphetamine-dextroamphetamine (ADDERALL) 5 MG tablet    Sig: Take 1 tablet (5 mg total) by mouth daily.    Dispense:  30 tablet    Refill:  0

## 2012-08-05 NOTE — Patient Instructions (Addendum)
1. Hematuria  POCT urinalysis dipstick, POCT UA - Microscopic Only, POCT UA - Microscopic Only, POCT urinalysis dipstick  2. Low back pain radiating to right leg    3. Essential hypertension, benign  Comprehensive metabolic panel  4. Anemia, unspecified  POCT CBC  5. ADD (attention deficit disorder)    6. Other abnormal glucose  POCT glycosylated hemoglobin (Hb A1C)  7. Attention deficit  amphetamine-dextroamphetamine (ADDERALL XR) 20 MG 24 hr capsule, DISCONTINUED: amphetamine-dextroamphetamine (ADDERALL XR) 20 MG 24 hr capsule, DISCONTINUED: amphetamine-dextroamphetamine (ADDERALL XR) 20 MG 24 hr capsule

## 2012-08-07 DIAGNOSIS — M47817 Spondylosis without myelopathy or radiculopathy, lumbosacral region: Secondary | ICD-10-CM | POA: Diagnosis not present

## 2012-08-12 DIAGNOSIS — M47817 Spondylosis without myelopathy or radiculopathy, lumbosacral region: Secondary | ICD-10-CM | POA: Diagnosis not present

## 2012-08-12 DIAGNOSIS — M546 Pain in thoracic spine: Secondary | ICD-10-CM | POA: Diagnosis not present

## 2012-08-12 DIAGNOSIS — M999 Biomechanical lesion, unspecified: Secondary | ICD-10-CM | POA: Diagnosis not present

## 2012-08-12 DIAGNOSIS — M545 Low back pain: Secondary | ICD-10-CM | POA: Diagnosis not present

## 2012-08-13 ENCOUNTER — Ambulatory Visit (INDEPENDENT_AMBULATORY_CARE_PROVIDER_SITE_OTHER): Payer: Medicare Other | Admitting: Family Medicine

## 2012-08-13 VITALS — BP 131/83 | HR 84 | Temp 98.4°F | Resp 16 | Ht 61.0 in | Wt 198.0 lb

## 2012-08-13 DIAGNOSIS — M545 Low back pain: Secondary | ICD-10-CM | POA: Diagnosis not present

## 2012-08-13 DIAGNOSIS — M5126 Other intervertebral disc displacement, lumbar region: Secondary | ICD-10-CM

## 2012-08-13 DIAGNOSIS — D72819 Decreased white blood cell count, unspecified: Secondary | ICD-10-CM | POA: Diagnosis not present

## 2012-08-13 DIAGNOSIS — R197 Diarrhea, unspecified: Secondary | ICD-10-CM

## 2012-08-13 DIAGNOSIS — M999 Biomechanical lesion, unspecified: Secondary | ICD-10-CM | POA: Diagnosis not present

## 2012-08-13 DIAGNOSIS — M546 Pain in thoracic spine: Secondary | ICD-10-CM | POA: Diagnosis not present

## 2012-08-13 LAB — POCT SEDIMENTATION RATE: POCT SED RATE: 10 mm/hr (ref 0–22)

## 2012-08-13 LAB — POCT CBC
Granulocyte percent: 65 %G (ref 37–80)
HCT, POC: 46.8 % (ref 37.7–47.9)
Hemoglobin: 15.5 g/dL (ref 12.2–16.2)
MCV: 93.7 fL (ref 80–97)
POC Granulocyte: 6.4 (ref 2–6.9)
RBC: 4.99 M/uL (ref 4.04–5.48)

## 2012-08-13 NOTE — Patient Instructions (Addendum)
1. Leukocytopenia  POCT CBC, POCT SEDIMENTATION RATE  2. Lumbar herniated disc

## 2012-08-13 NOTE — Progress Notes (Signed)
[address]   [place], [place]  [address]   [phone]  Subjective:    Patient ID: [name], female    DOB: [birth year], 47 y.o.   MRN: [id number]  HPIThis 47 y.o. female presents for pre-operative clearance for lumbar spine procedure.  Follow-up with orthopedist who was concerned about low WBC count.  Never started Prednisone taper; evaluated by ortho two days after visit.  Ortho wants repeat labs before performing steroid injection.  S/p MRI last week; herniated disc at L4-5; scheduled for epidural spinal injection in two weeks.  Still in horrible pain.  No fevers but night sweats; no chills.  Night sweats present for past several four weeks.  +fatigue/tired but in nursing school; has been tired since school started.  Feels depleted but suffering with pain due to herniated disc; not doing anything but school.  Denies sore throat, rhinorrhea, nasal congestion, cough.  +diarrhea for one week; +loose stools 5-6 per day for past week.  No urinary symptoms.  No rash.     Review of Systems  Constitutional: Positive for fatigue. Negative for fever, chills and diaphoresis.  HENT: Negative for ear pain, congestion, sore throat, rhinorrhea and postnasal drip.   Respiratory: Negative for cough.   Gastrointestinal: Positive for diarrhea. Negative for nausea and vomiting.  Skin: Negative for rash.  Neurological: Negative for headaches.  Hematological: Negative for adenopathy. Does not bruise/bleed easily.        Past Medical History  Diagnosis Date  . Hypertension   . GERD (gastroesophageal reflux disease)   . Fluid retention   . DVT (deep venous thrombosis)   . Celiac sprue   . Attention deficit disorder with hyperactivity   . IBS (irritable bowel syndrome)   . Benign neoplasm of skin, site unspecified   . Allergic rhinitis, cause unspecified   . Optic neuritis, unspecified   . Eating disorder, unspecified   . Calculus of kidney   . Tachycardia, unspecified   . Benign neoplasm of  colon   . Apnea   . Other chronic allergic conjunctivitis   . Multiple sclerosis   . Tremor   . Symptomatic states associated with artificial menopause   . Anxiety state, unspecified   . Genital herpes, unspecified   . Rosacea   . Unspecified asthma     Past Surgical History  Procedure Date  . Abdominal hysterectomy 2008  . Cholecystectomy 2002  . Appendectomy 2005  . Cardiac catheterization 09/2009  . Tonsilectomy, adenoidectomy, bilateral myringotomy and tubes 1987  . Rotator cuff repair 1996    Right  . Nevus resection 06/2010    R upper back; L anterior chest 08/2011.    Prior to Admission medications   Medication Sig Start Date End Date Taking? Authorizing Provider  albuterol (PROVENTIL HFA;VENTOLIN HFA) 108 (90 BASE) MCG/ACT inhaler Inhale 2 puffs into the lungs every 6 (six) hours as needed.     Yes Historical Provider, MD  albuterol (PROVENTIL) (2.5 MG/3ML) 0.083% nebulizer solution Take 3 mLs (2.5 mg total) by nebulization every 6 (six) hours as needed for wheezing. 03/01/12 03/01/13 Yes Ethelda Chick, MD  ALPRAZolam Prudy Feeler) 0.5 MG tablet Take 0.5 mg by mouth as needed. .5 to 1 tab daily as needed   Yes Historical Provider, MD  amphetamine-dextroamphetamine (ADDERALL XR) 20 MG 24 hr capsule Take 1 capsule (20 mg total) by mouth every morning. DO NOT FILL UNTIL 10-03-12. 08/05/12  Yes Ethelda Chick, MD  amphetamine-dextroamphetamine (ADDERALL) 5 MG tablet Take  1 tablet (5 mg total) by mouth daily. 08/05/12  Yes Ethelda Chick, MD  budesonide-formoterol [facility]) 80-4.5 MCG/ACT inhaler Inhale 2 puffs into the lungs 2 (two) times daily.     Yes Historical Provider, MD  cyclobenzaprine (FLEXERIL) 10 MG tablet Take 10 mg by mouth 2 (two) times daily as needed.   Yes Historical Provider, MD  dexlansoprazole (DEXILANT) 60 MG capsule One tablet two times daily 03/01/12  Yes Ethelda Chick, MD  EPINEPHrine (EPIPEN) 0.3 mg/0.3 mL DEVI Inject 0.3 mLs (0.3 mg total) into the muscle  once. 03/01/12  Yes Ethelda Chick, MD  hydrochlorothiazide (HYDRODIURIL) 25 MG tablet Take 1 tablet (25 mg total) by mouth daily. 03/01/12  Yes Ethelda Chick, MD  loratadine (CLARITIN) 10 MG tablet Take 10 mg by mouth daily.   Yes Historical Provider, MD  naproxen sodium (ANAPROX) 550 MG tablet Take 1 tablet (550 mg total) by mouth 2 (two) times daily as needed. 05/05/12  Yes Ethelda Chick, MD  nitroGLYCERIN (NITROSTAT) 0.4 MG SL tablet Place 1 tablet (0.4 mg total) under the tongue every 5 (five) minutes as needed for chest pain. 10/30/11 10/29/12 Yes Beatrice Lecher, PA  valACYclovir (VALTREX) 500 MG tablet Take 1 tablet (500 mg total) by mouth 2 (two) times daily. Pt states she hasnt taken in 1.5 years 03/01/12  Yes Ethelda Chick, MD  sulfamethoxazole-trimethoprim (BACTRIM DS) 800-160 MG per tablet Take 1 tablet by mouth 2 (two) times daily.    Historical Provider, MD  traMADol (ULTRAM) 50 MG tablet Take 50 mg by mouth every 6 (six) hours as needed.    Historical Provider, MD    Allergies  Allergen Reactions  . Codeine     REACTION: hallucinations  . Erythromycin     REACTION: seizures  . Levofloxacin     REACTION: hallucinations  . Oxycodone Hcl     REACTION: passed out  . Telithromycin Rash    Generic for Ketek    History   Social History  . Marital Status: Divorced    Spouse Name: N/A    Number of Children: N/A  . Years of Education: college   Occupational History  . disabled     due to MS  . full time student     Nursing   Social History Main Topics  . Smoking status: Never Smoker   . Smokeless tobacco: Never Used  . Alcohol Use: No  . Drug Use: No  . Sexually Active: Not Currently    Birth Control/ Protection: None   Other Topics Concern  . Not on file   Social History Narrative   Divorced since 1998 after 1 year of marriage, first marriage. Caffeine use: carbonated beverage about 4 serving / day. Lives with best friend, son, best friend;s two sons in house..  Always uses seat belts. Exercise: Inactive.    Family History  Problem Relation Age of Onset  . Cancer Neg Hx     gyn  . Alcohol abuse Mother   . Diabetes Mother   . Coronary artery disease Mother     Carotid Artery DIsease  . Diabetes Father   . Hypertension Father   . Colon cancer      Objective:   Physical Exam  Nursing note and vitals reviewed. Constitutional: She is oriented to person, place, and time. She appears well-developed and well-nourished. No distress.  HENT:  Head: Normocephalic and atraumatic.  Right Ear: External ear normal.  Left Ear: External ear  normal.  Nose: Nose normal.  Mouth/Throat: Oropharynx is clear and moist.  Eyes: Conjunctivae normal are normal. Pupils are equal, round, and reactive to light.  Neck: Normal range of motion. Neck supple. No thyromegaly present.  Cardiovascular: Normal rate, regular rhythm and normal heart sounds.   No murmur heard. Pulmonary/Chest: Effort normal and breath sounds normal.  Abdominal: Soft. Bowel sounds are normal. There is no tenderness. There is no rebound.  Lymphadenopathy:    She has no cervical adenopathy.  Neurological: She is alert and oriented to person, place, and time.  Skin: No rash noted. She is not diaphoretic.  Psychiatric: She has a normal mood and affect. Her behavior is normal.       Results for orders placed in visit on 08/13/12  POCT CBC      Component Value Range   WBC 9.8  4.6 - 10.2 K/uL   Lymph, poc 2.8  0.6 - 3.4   POC LYMPH PERCENT 29.0  10 - 50 %L   MID (cbc) 0.6  0 - 0.9   POC MID % 6.0  0 - 12 %M   POC Granulocyte 6.4  2 - 6.9   Granulocyte percent 65.0  37 - 80 %G   RBC 4.99  4.04 - 5.48 M/uL   Hemoglobin 15.5  12.2 - 16.2 g/dL   HCT, POC 03.4  74.2 - 47.9 %   MCV 93.7  80 - 97 fL   MCH, POC 31.1  27 - 31.2 pg   MCHC 33.1  31.8 - 35.4 g/dL   RDW, POC 59.5     Platelet Count, POC 328  142 - 424 K/uL   MPV 10.3  0 - 99.8 fL    Assessment & Plan:   1. Leukocytopenia   POCT CBC, POCT SEDIMENTATION RATE  2. Lumbar herniated disc     1. Leukocytopenia:  Resolved.  Asymptomatic. 2.  Lumbar herniated disc: persistent.  To undergo epidural injection. 3. Diarrhea: New. If persists > 10 days, RTC for stool cultures.

## 2012-08-15 DIAGNOSIS — M546 Pain in thoracic spine: Secondary | ICD-10-CM | POA: Diagnosis not present

## 2012-08-15 DIAGNOSIS — M545 Low back pain: Secondary | ICD-10-CM | POA: Diagnosis not present

## 2012-08-15 DIAGNOSIS — M999 Biomechanical lesion, unspecified: Secondary | ICD-10-CM | POA: Diagnosis not present

## 2012-08-17 DIAGNOSIS — M999 Biomechanical lesion, unspecified: Secondary | ICD-10-CM | POA: Diagnosis not present

## 2012-08-17 DIAGNOSIS — M546 Pain in thoracic spine: Secondary | ICD-10-CM | POA: Diagnosis not present

## 2012-08-17 DIAGNOSIS — M545 Low back pain: Secondary | ICD-10-CM | POA: Diagnosis not present

## 2012-08-17 NOTE — Progress Notes (Signed)
Reviewed and agree.

## 2012-08-18 DIAGNOSIS — M546 Pain in thoracic spine: Secondary | ICD-10-CM | POA: Diagnosis not present

## 2012-08-18 DIAGNOSIS — M545 Low back pain: Secondary | ICD-10-CM | POA: Diagnosis not present

## 2012-08-18 DIAGNOSIS — M999 Biomechanical lesion, unspecified: Secondary | ICD-10-CM | POA: Diagnosis not present

## 2012-08-19 DIAGNOSIS — M545 Low back pain: Secondary | ICD-10-CM | POA: Diagnosis not present

## 2012-08-19 DIAGNOSIS — M546 Pain in thoracic spine: Secondary | ICD-10-CM | POA: Diagnosis not present

## 2012-08-19 DIAGNOSIS — M999 Biomechanical lesion, unspecified: Secondary | ICD-10-CM | POA: Diagnosis not present

## 2012-08-22 DIAGNOSIS — M545 Low back pain: Secondary | ICD-10-CM | POA: Diagnosis not present

## 2012-08-22 DIAGNOSIS — M999 Biomechanical lesion, unspecified: Secondary | ICD-10-CM | POA: Diagnosis not present

## 2012-08-22 DIAGNOSIS — M546 Pain in thoracic spine: Secondary | ICD-10-CM | POA: Diagnosis not present

## 2012-08-24 DIAGNOSIS — M5137 Other intervertebral disc degeneration, lumbosacral region: Secondary | ICD-10-CM | POA: Diagnosis not present

## 2012-08-24 DIAGNOSIS — M999 Biomechanical lesion, unspecified: Secondary | ICD-10-CM | POA: Diagnosis not present

## 2012-08-24 DIAGNOSIS — M5106 Intervertebral disc disorders with myelopathy, lumbar region: Secondary | ICD-10-CM | POA: Diagnosis not present

## 2012-08-24 DIAGNOSIS — M5430 Sciatica, unspecified side: Secondary | ICD-10-CM | POA: Diagnosis not present

## 2012-08-25 DIAGNOSIS — M5137 Other intervertebral disc degeneration, lumbosacral region: Secondary | ICD-10-CM | POA: Diagnosis not present

## 2012-08-25 DIAGNOSIS — M999 Biomechanical lesion, unspecified: Secondary | ICD-10-CM | POA: Diagnosis not present

## 2012-08-25 DIAGNOSIS — M5430 Sciatica, unspecified side: Secondary | ICD-10-CM | POA: Diagnosis not present

## 2012-08-25 DIAGNOSIS — M5106 Intervertebral disc disorders with myelopathy, lumbar region: Secondary | ICD-10-CM | POA: Diagnosis not present

## 2012-08-26 DIAGNOSIS — IMO0002 Reserved for concepts with insufficient information to code with codable children: Secondary | ICD-10-CM | POA: Diagnosis not present

## 2012-08-31 DIAGNOSIS — M5430 Sciatica, unspecified side: Secondary | ICD-10-CM | POA: Diagnosis not present

## 2012-08-31 DIAGNOSIS — M5106 Intervertebral disc disorders with myelopathy, lumbar region: Secondary | ICD-10-CM | POA: Diagnosis not present

## 2012-08-31 DIAGNOSIS — M999 Biomechanical lesion, unspecified: Secondary | ICD-10-CM | POA: Diagnosis not present

## 2012-08-31 DIAGNOSIS — M5137 Other intervertebral disc degeneration, lumbosacral region: Secondary | ICD-10-CM | POA: Diagnosis not present

## 2012-09-01 DIAGNOSIS — M999 Biomechanical lesion, unspecified: Secondary | ICD-10-CM | POA: Diagnosis not present

## 2012-09-02 DIAGNOSIS — M999 Biomechanical lesion, unspecified: Secondary | ICD-10-CM | POA: Diagnosis not present

## 2012-09-03 DIAGNOSIS — M999 Biomechanical lesion, unspecified: Secondary | ICD-10-CM | POA: Diagnosis not present

## 2012-09-03 DIAGNOSIS — M5106 Intervertebral disc disorders with myelopathy, lumbar region: Secondary | ICD-10-CM | POA: Diagnosis not present

## 2012-09-03 DIAGNOSIS — M5137 Other intervertebral disc degeneration, lumbosacral region: Secondary | ICD-10-CM | POA: Diagnosis not present

## 2012-09-03 DIAGNOSIS — D239 Other benign neoplasm of skin, unspecified: Secondary | ICD-10-CM | POA: Diagnosis not present

## 2012-09-03 DIAGNOSIS — M5430 Sciatica, unspecified side: Secondary | ICD-10-CM | POA: Diagnosis not present

## 2012-09-04 DIAGNOSIS — M999 Biomechanical lesion, unspecified: Secondary | ICD-10-CM | POA: Diagnosis not present

## 2012-09-04 DIAGNOSIS — M5106 Intervertebral disc disorders with myelopathy, lumbar region: Secondary | ICD-10-CM | POA: Diagnosis not present

## 2012-09-04 DIAGNOSIS — M5137 Other intervertebral disc degeneration, lumbosacral region: Secondary | ICD-10-CM | POA: Diagnosis not present

## 2012-09-04 DIAGNOSIS — M5430 Sciatica, unspecified side: Secondary | ICD-10-CM | POA: Diagnosis not present

## 2012-10-10 DIAGNOSIS — J029 Acute pharyngitis, unspecified: Secondary | ICD-10-CM | POA: Diagnosis not present

## 2012-10-10 DIAGNOSIS — H6690 Otitis media, unspecified, unspecified ear: Secondary | ICD-10-CM | POA: Diagnosis not present

## 2012-10-20 ENCOUNTER — Encounter: Payer: Self-pay | Admitting: Family Medicine

## 2012-10-20 ENCOUNTER — Ambulatory Visit (INDEPENDENT_AMBULATORY_CARE_PROVIDER_SITE_OTHER): Payer: Medicare Other | Admitting: Family Medicine

## 2012-10-20 VITALS — BP 120/75 | HR 78 | Temp 97.1°F | Resp 18 | Wt 197.0 lb

## 2012-10-20 DIAGNOSIS — F988 Other specified behavioral and emotional disorders with onset usually occurring in childhood and adolescence: Secondary | ICD-10-CM

## 2012-10-20 DIAGNOSIS — B373 Candidiasis of vulva and vagina: Secondary | ICD-10-CM | POA: Diagnosis not present

## 2012-10-20 DIAGNOSIS — H6690 Otitis media, unspecified, unspecified ear: Secondary | ICD-10-CM

## 2012-10-20 DIAGNOSIS — J029 Acute pharyngitis, unspecified: Secondary | ICD-10-CM | POA: Diagnosis not present

## 2012-10-20 DIAGNOSIS — H6691 Otitis media, unspecified, right ear: Secondary | ICD-10-CM

## 2012-10-20 DIAGNOSIS — K219 Gastro-esophageal reflux disease without esophagitis: Secondary | ICD-10-CM

## 2012-10-20 DIAGNOSIS — R41840 Attention and concentration deficit: Secondary | ICD-10-CM

## 2012-10-20 DIAGNOSIS — N898 Other specified noninflammatory disorders of vagina: Secondary | ICD-10-CM | POA: Diagnosis not present

## 2012-10-20 DIAGNOSIS — L219 Seborrheic dermatitis, unspecified: Secondary | ICD-10-CM

## 2012-10-20 DIAGNOSIS — L301 Dyshidrosis [pompholyx]: Secondary | ICD-10-CM

## 2012-10-20 LAB — POCT WET PREP WITH KOH
KOH Prep POC: POSITIVE
Trichomonas, UA: NEGATIVE
Yeast Wet Prep HPF POC: NEGATIVE

## 2012-10-20 MED ORDER — AMPHETAMINE-DEXTROAMPHETAMINE 5 MG PO TABS: 5.0000 mg | ORAL_TABLET | Freq: Every day | ORAL | Status: DC

## 2012-10-20 MED ORDER — AMPHETAMINE-DEXTROAMPHETAMINE 5 MG PO TABS: 10.0000 mg | ORAL_TABLET | Freq: Every day | ORAL | Status: DC

## 2012-10-20 MED ORDER — AMPHETAMINE-DEXTROAMPHET ER 20 MG PO CP24: 20.0000 mg | ORAL_CAPSULE | ORAL | Status: DC

## 2012-10-20 MED ORDER — FLUCONAZOLE 150 MG PO TABS: 150.0000 mg | ORAL_TABLET | Freq: Once | ORAL | Status: DC

## 2012-10-20 MED ORDER — DEXLANSOPRAZOLE 60 MG PO CPDR: DELAYED_RELEASE_CAPSULE | ORAL | Status: DC

## 2012-10-20 MED ORDER — DESONIDE 0.05 % EX LOTN: TOPICAL_LOTION | Freq: Two times a day (BID) | CUTANEOUS | Status: DC

## 2012-10-20 MED ORDER — TRIAMCINOLONE ACETONIDE 0.1 % EX OINT: TOPICAL_OINTMENT | Freq: Two times a day (BID) | CUTANEOUS | Status: DC

## 2012-10-20 MED ORDER — AMPHETAMINE-DEXTROAMPHETAMINE 10 MG PO TABS: 10.0000 mg | ORAL_TABLET | Freq: Every day | ORAL | Status: DC

## 2012-10-20 NOTE — Progress Notes (Signed)
[address]   [place], [place]  [address]   [phone]  Subjective:    Patient ID: [name], female    DOB: April 06, 1966, 47 y.o.   MRN: [id number]  HPI This 47 y.o. female presents for evaluation of the following:  1. Sore throat: eight days ago, developed sore throat.  Rapid strep negative.  No throat culture sent; treated with antibiotic due to otitis media. Treated with Amoxicillin 875mg  bid x 10 days.  No fever/chills/sweats in past 72 hours; some mild chills.  No headache.  No ear pain but ear feels full; mild discomfort with laying supine.  ST now resolved. No rhinorrhea; no nasal congestion; no cough.  No n/v/d.  Glands were very tender.  +r ear drainage now better.  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.  2. Allergic Rhinitis:  Claritin does not seem to help ear congestion.  No sneezing;  +itchy eyes but no itchy nose.  3.  Vaginal discharge/itching: onset three days ago.  No medications OTC; usually do not work; makes sick with fever.  No HSV outbreak.  No new sexual partners; not sexually active; s/p hysterectomy. Denies dysuria, hematuria, frequency, urgency.  4.  ADD: stable on current medication dose; does not concentrate as well on third shift with Adderall.  Excellent school performance.  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  5. Facial rash: peeling along nasolabial folds and between eye brows.  Very annoying recurrent rash.  6.  Scaling/peeling of hands with itching: wears gloves a lot in hospital; also washing hands a lot.  Now hands itching and peeling.    Review of Systems  Constitutional: Negative for fever, chills, diaphoresis and fatigue.  HENT: Positive for voice change. Negative for ear pain, congestion, sore throat, rhinorrhea, sneezing, trouble swallowing, postnasal drip and sinus pressure.   Eyes: Positive for itching. Negative for pain, discharge and redness.  Respiratory: Negative for cough and shortness of breath.     Cardiovascular: Negative for chest pain and palpitations.  Genitourinary: Positive for vaginal discharge, vaginal pain and pelvic pain. Negative for dysuria, frequency, hematuria, flank pain, vaginal bleeding, enuresis, genital sores and dyspareunia.  Allergic/Immunologic: Positive for environmental allergies.  Psychiatric/Behavioral: Positive for decreased concentration. Negative for sleep disturbance and dysphoric mood. The patient is not nervous/anxious.     Past Medical History  Diagnosis Date  . Hypertension   . GERD (gastroesophageal reflux disease)   . Fluid retention   . DVT (deep venous thrombosis)   . Celiac sprue   . Attention deficit disorder with hyperactivity   . IBS (irritable bowel syndrome)   . Benign neoplasm of skin, site unspecified   . Allergic rhinitis, cause unspecified   . Optic neuritis, unspecified   . Eating disorder, unspecified   . Calculus of kidney   . Tachycardia, unspecified   . Benign neoplasm of colon   . Apnea   . Other chronic allergic conjunctivitis   . Multiple sclerosis   . Tremor   . Symptomatic states associated with artificial menopause   . Anxiety state, unspecified   . Genital herpes, unspecified   . Rosacea   . Unspecified asthma   . Heart murmur     Past Surgical History  Procedure Laterality Date  . Abdominal hysterectomy  2008  . Cholecystectomy  2002  . Appendectomy  2005  . Cardiac catheterization  09/2009  . Tonsilectomy, adenoidectomy, bilateral myringotomy and tubes  1987  . Rotator cuff repair  1996  Right  . Nevus resection  06/2010    R upper back; L anterior chest 08/2011.    Prior to Admission medications   Medication Sig Start Date End Date Taking? Authorizing Provider  albuterol (PROVENTIL) (2.5 MG/3ML) 0.083% nebulizer solution Take 3 mLs (2.5 mg total) by nebulization every 6 (six) hours as needed for wheezing. 03/01/12 03/01/13 Yes Ethelda Chick, MD  amphetamine-dextroamphetamine (ADDERALL XR) 20 MG  24 hr capsule Take 1 capsule (20 mg total) by mouth every morning. DO NOT FILL UNTIL 01-03-13. 10/20/12  Yes Ethelda Chick, MD  budesonide-formoterol [facility]) 80-4.5 MCG/ACT inhaler Inhale 2 puffs into the lungs 2 (two) times daily.     Yes Historical Provider, MD  EPINEPHrine (EPIPEN) 0.3 mg/0.3 mL DEVI Inject 0.3 mLs (0.3 mg total) into the muscle once. 03/01/12  Yes Ethelda Chick, MD  hydrochlorothiazide (HYDRODIURIL) 25 MG tablet Take 1 tablet (25 mg total) by mouth daily. 03/01/12  Yes Ethelda Chick, MD  loratadine (CLARITIN) 10 MG tablet Take 10 mg by mouth daily.   Yes Historical Provider, MD  nitroGLYCERIN (NITROSTAT) 0.4 MG SL tablet Place 1 tablet (0.4 mg total) under the tongue every 5 (five) minutes as needed for chest pain. 10/30/11 10/29/12 Yes Scott Moishe Spice, PA-C  valACYclovir (VALTREX) 500 MG tablet Take 1 tablet (500 mg total) by mouth 2 (two) times daily. Pt states she hasnt taken in 1.5 years 03/01/12  Yes Ethelda Chick, MD  albuterol (PROVENTIL HFA;VENTOLIN HFA) 108 (90 BASE) MCG/ACT inhaler Inhale 2 puffs into the lungs every 6 (six) hours as needed for wheezing or shortness of breath. 11/03/12   Ethelda Chick, MD  ciprofloxacin (CIPRO) 500 MG tablet Take 1 tablet (500 mg total) by mouth 2 (two) times daily. 11/07/12   Ethelda Chick, MD  desonide (DESOWEN) 0.05 % lotion Apply topically 2 (two) times daily. 10/20/12   Ethelda Chick, MD  dexlansoprazole (DEXILANT) 60 MG capsule Take 60 mg by mouth 2 (two) times daily.    Historical Provider, MD  fluconazole (DIFLUCAN) 150 MG tablet Take 1 tablet (150 mg total) by mouth once. Repeat if needed 10/20/12   Ethelda Chick, MD  HYDROcodone-acetaminophen [facility]) 10-325 MG per tablet Take 1-2 tablets by mouth every 4 (four) hours as needed (1 tablet for mild pain, 1.5 tablets for moderate pain, 2 tablets for severe pain). 10/29/12   Freeman Caldron, PA-C  methocarbamol (ROBAXIN) 500 MG tablet Take 1-2 tablets (500-1,000 mg total) by mouth 4  (four) times daily as needed (Muscle spasm). 10/29/12   Freeman Caldron, PA-C  naproxen (NAPROSYN) 500 MG tablet Take 1 tablet (500 mg total) by mouth 2 (two) times daily with a meal. 10/29/12   Freeman Caldron, PA-C  ondansetron (ZOFRAN) 4 MG tablet Take 1 tablet (4 mg total) by mouth every 6 (six) hours as needed for nausea. 10/29/12   Freeman Caldron, PA-C  promethazine (PHENERGAN) 12.5 MG tablet Take 1 tablet (12.5 mg total) by mouth every 6 (six) hours as needed for nausea. 10/29/12   Freeman Caldron, PA-C  traMADol (ULTRAM) 50 MG tablet Take 2 tablets (100 mg total) by mouth every 6 (six) hours. 10/29/12   Freeman Caldron, PA-C  triamcinolone ointment (KENALOG) 0.1 % Apply topically 2 (two) times daily. Apply to hands 10/20/12   Ethelda Chick, MD    Allergies  Allergen Reactions  . Codeine     REACTION: hallucinations  . Erythromycin  REACTION: seizures  . Levofloxacin     REACTION: hallucinations  . Oxycodone Hcl     REACTION: passed out  . Telithromycin Rash    Generic for Ketek    History   Social History  . Marital Status: Divorced    Spouse Name: N/A    Number of Children: N/A  . Years of Education: college   Occupational History  . disabled     due to MS  . full time student     Nursing   Social History Main Topics  . Smoking status: Never Smoker   . Smokeless tobacco: Never Used  . Alcohol Use: No  . Drug Use: No  . Sexually Active: Not Currently    Birth Control/ Protection: None, Abstinence   Other Topics Concern  . Not on file   Social History Narrative   Divorced since 1998 after 1 year of marriage, first marriage. Caffeine use: carbonated beverage about 4 serving / day. Lives with best friend, son, best friend;s two sons in house.. Always uses seat belts. Exercise: Inactive.    Family History  Problem Relation Age of Onset  . Cancer Neg Hx     gyn  . Alcohol abuse Mother   . Diabetes Mother   . Coronary artery disease Mother      Carotid Artery DIsease  . Hypertension Mother   . Diabetes Father   . Hypertension Father   . Colon cancer    . Kidney Stones Son   . Celiac disease Son        Objective:   Physical Exam  Nursing note and vitals reviewed. Constitutional: She is oriented to person, place, and time. She appears well-developed and well-nourished. No distress.  HENT:  Head: Normocephalic and atraumatic.  Right Ear: External ear normal.  Left Ear: External ear normal.  Nose: Nose normal.  Mouth/Throat: Oropharynx is clear and moist.  B tubes without drainage and intact; no TM erythema.  Eyes: Conjunctivae and EOM are normal. Pupils are equal, round, and reactive to light.  Neck: Normal range of motion. Neck supple.  Cardiovascular: Normal rate, regular rhythm and normal heart sounds.   Pulmonary/Chest: Effort normal and breath sounds normal. She has no wheezes. She has no rales.  Abdominal: Soft. Bowel sounds are normal. She exhibits no distension and no mass. There is no hepatosplenomegaly. There is tenderness in the suprapubic area. There is no rebound, no guarding and no CVA tenderness. No hernia.  Genitourinary: There is no rash, tenderness or lesion on the right labia. There is no rash, tenderness or lesion on the left labia. No erythema, tenderness or bleeding around the vagina. No foreign body around the vagina. Vaginal discharge found.  Lymphadenopathy:    She has no cervical adenopathy.  Neurological: She is alert and oriented to person, place, and time.  Skin: Skin is warm and dry. No rash noted. She is not diaphoretic.  Psychiatric: She has a normal mood and affect. Her behavior is normal.    Results for orders placed in visit on 10/20/12  POCT WET PREP WITH KOH      Result Value Range   Trichomonas, UA Negative     Clue Cells Wet Prep HPF POC tntc     Epithelial Wet Prep HPF POC tntc     Yeast Wet Prep HPF POC neg     Bacteria Wet Prep HPF POC 3+     RBC Wet Prep HPF POC 0-4     WBC  Wet Prep HPF POC 0-6     KOH Prep POC Positive         Assessment & Plan:  Vaginal discharge - Plan: POCT Wet Prep with KOH  Acute pharyngitis  Otitis media, right  Candidiasis of vulva and vagina - Plan: fluconazole (DIFLUCAN) 150 MG tablet  GERD (gastroesophageal reflux disease) - Plan: DISCONTINUED: dexlansoprazole (DEXILANT) 60 MG capsule  Attention deficit - Plan: amphetamine-dextroamphetamine (ADDERALL XR) 20 MG 24 hr capsule, DISCONTINUED: amphetamine-dextroamphetamine (ADDERALL XR) 20 MG 24 hr capsule, DISCONTINUED: amphetamine-dextroamphetamine (ADDERALL XR) 20 MG 24 hr capsule  ADD (attention deficit disorder)  Seborrhea  Eczema, dyshidrotic   1.  Candidiasis Vulvovaginal: New.   Rx for Diflucan provided. 2.  Acute pharyngitis with R otitis media: New.  S/p treatment with improvement. 3.  GERD: stable; refill provided. 4.  ADD:  Controlled; refills x 3 provided of Adderall XR 20mg  daily.  Rx for Adderall 5mg  to take as needed. 5.  Seborrhea dermatitis: Recurrent; rx for Desonide provided; also recommend Selsun Blue shampoo to eyebrows twice weekly. 6. Dyshidrotic eczema: New.  B hands; rx for Triamcinolone ointment provided for bid use.   Meds ordered this encounter  Medications  . fluconazole (DIFLUCAN) 150 MG tablet    Sig: Take 1 tablet (150 mg total) by mouth once. Repeat if needed    Dispense:  2 tablet    Refill:  0  . DISCONTD: dexlansoprazole (DEXILANT) 60 MG capsule    Sig: One tablet two times daily    Dispense:  60 capsule    Refill:  2  . DISCONTD: amphetamine-dextroamphetamine (ADDERALL XR) 20 MG 24 hr capsule    Sig: Take 1 capsule (20 mg total) by mouth every morning. DO NOT FILL UNTIL 11-03-12.    Dispense:  30 capsule    Refill:  0  . DISCONTD: amphetamine-dextroamphetamine (ADDERALL XR) 20 MG 24 hr capsule    Sig: Take 1 capsule (20 mg total) by mouth every morning. DO NOT FILL UNTIL 12-03-12.    Dispense:  30 capsule    Refill:  0  .  amphetamine-dextroamphetamine (ADDERALL XR) 20 MG 24 hr capsule    Sig: Take 1 capsule (20 mg total) by mouth every morning. DO NOT FILL UNTIL 01-03-13.    Dispense:  30 capsule    Refill:  0  . DISCONTD: amphetamine-dextroamphetamine (ADDERALL) 5 MG tablet    Sig: Take 1 tablet (5 mg total) by mouth daily.    Dispense:  30 tablet    Refill:  0  . DISCONTD: amphetamine-dextroamphetamine (ADDERALL) 5 MG tablet    Sig: Take 2 tablets (10 mg total) by mouth daily.    Dispense:  30 tablet    Refill:  0  . desonide (DESOWEN) 0.05 % lotion    Sig: Apply topically 2 (two) times daily.    Dispense:  59 mL    Refill:  0  . triamcinolone ointment (KENALOG) 0.1 %    Sig: Apply topically 2 (two) times daily. Apply to hands    Dispense:  30 g    Refill:  0  . DISCONTD: amphetamine-dextroamphetamine (ADDERALL) 10 MG tablet    Sig: Take 1 tablet (10 mg total) by mouth daily.    Dispense:  30 tablet    Refill:  0

## 2012-10-20 NOTE — Patient Instructions (Addendum)
Vaginal discharge - Plan: POCT Wet Prep with KOH  Acute pharyngitis  Otitis media, right  Candidiasis of vulva and vagina - Plan: fluconazole (DIFLUCAN) 150 MG tablet

## 2012-10-26 ENCOUNTER — Emergency Department (HOSPITAL_BASED_OUTPATIENT_CLINIC_OR_DEPARTMENT_OTHER): Payer: PRIVATE HEALTH INSURANCE

## 2012-10-26 ENCOUNTER — Encounter (HOSPITAL_BASED_OUTPATIENT_CLINIC_OR_DEPARTMENT_OTHER): Payer: Self-pay | Admitting: Emergency Medicine

## 2012-10-26 ENCOUNTER — Inpatient Hospital Stay (HOSPITAL_BASED_OUTPATIENT_CLINIC_OR_DEPARTMENT_OTHER)
Admission: EM | Admit: 2012-10-26 | Discharge: 2012-10-29 | DRG: 552 | Disposition: A | Discharge status: Home Health | Payer: PRIVATE HEALTH INSURANCE | Attending: General Surgery | Admitting: General Surgery

## 2012-10-26 DIAGNOSIS — G35 Multiple sclerosis: Secondary | ICD-10-CM | POA: Diagnosis present

## 2012-10-26 DIAGNOSIS — T148XXA Other injury of unspecified body region, initial encounter: Secondary | ICD-10-CM | POA: Diagnosis not present

## 2012-10-26 DIAGNOSIS — S139XXA Sprain of joints and ligaments of unspecified parts of neck, initial encounter: Principal | ICD-10-CM | POA: Diagnosis present

## 2012-10-26 DIAGNOSIS — R339 Retention of urine, unspecified: Secondary | ICD-10-CM | POA: Diagnosis present

## 2012-10-26 DIAGNOSIS — Z79899 Other long term (current) drug therapy: Secondary | ICD-10-CM

## 2012-10-26 DIAGNOSIS — M549 Dorsalgia, unspecified: Secondary | ICD-10-CM | POA: Diagnosis not present

## 2012-10-26 DIAGNOSIS — I10 Essential (primary) hypertension: Secondary | ICD-10-CM | POA: Diagnosis present

## 2012-10-26 DIAGNOSIS — S161XXA Strain of muscle, fascia and tendon at neck level, initial encounter: Secondary | ICD-10-CM

## 2012-10-26 DIAGNOSIS — R29898 Other symptoms and signs involving the musculoskeletal system: Secondary | ICD-10-CM | POA: Diagnosis present

## 2012-10-26 DIAGNOSIS — J45909 Unspecified asthma, uncomplicated: Secondary | ICD-10-CM | POA: Diagnosis present

## 2012-10-26 DIAGNOSIS — K219 Gastro-esophageal reflux disease without esophagitis: Secondary | ICD-10-CM | POA: Diagnosis present

## 2012-10-26 DIAGNOSIS — F909 Attention-deficit hyperactivity disorder, unspecified type: Secondary | ICD-10-CM | POA: Diagnosis present

## 2012-10-26 DIAGNOSIS — G35D Multiple sclerosis, unspecified: Secondary | ICD-10-CM | POA: Diagnosis present

## 2012-10-26 DIAGNOSIS — M542 Cervicalgia: Secondary | ICD-10-CM | POA: Diagnosis not present

## 2012-10-26 LAB — URINE MICROSCOPIC-ADD ON

## 2012-10-26 LAB — URINALYSIS, ROUTINE W REFLEX MICROSCOPIC
Bilirubin Urine: NEGATIVE
Glucose, UA: NEGATIVE mg/dL
Hgb urine dipstick: NEGATIVE
Ketones, ur: NEGATIVE mg/dL
Protein, ur: NEGATIVE mg/dL
Urobilinogen, UA: 0.2 mg/dL (ref 0.0–1.0)

## 2012-10-26 MED ORDER — ONDANSETRON HCL 4 MG/2ML IJ SOLN
4.0000 mg | Freq: Once | INTRAMUSCULAR | Status: AC
  Administered 2012-10-26: 4 mg via INTRAVENOUS
  Filled 2012-10-26: qty 2

## 2012-10-26 MED ORDER — IOHEXOL 300 MG/ML  SOLN: 100.0000 mL | Freq: Once | INTRAMUSCULAR | Status: AC | PRN

## 2012-10-26 MED ORDER — MORPHINE SULFATE 4 MG/ML IJ SOLN
4.0000 mg | Freq: Once | INTRAMUSCULAR | Status: AC
  Administered 2012-10-26: 4 mg via INTRAVENOUS
  Filled 2012-10-26: qty 1

## 2012-10-26 NOTE — ED Notes (Signed)
MD at bedside. 
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
## 2012-10-27 ENCOUNTER — Inpatient Hospital Stay (HOSPITAL_COMMUNITY): Payer: PRIVATE HEALTH INSURANCE

## 2012-10-27 ENCOUNTER — Emergency Department (HOSPITAL_COMMUNITY): Payer: PRIVATE HEALTH INSURANCE

## 2012-10-27 DIAGNOSIS — R29898 Other symptoms and signs involving the musculoskeletal system: Secondary | ICD-10-CM | POA: Diagnosis present

## 2012-10-27 DIAGNOSIS — M25559 Pain in unspecified hip: Secondary | ICD-10-CM | POA: Diagnosis not present

## 2012-10-27 DIAGNOSIS — S0993XA Unspecified injury of face, initial encounter: Secondary | ICD-10-CM | POA: Diagnosis not present

## 2012-10-27 DIAGNOSIS — R29818 Other symptoms and signs involving the nervous system: Secondary | ICD-10-CM | POA: Diagnosis not present

## 2012-10-27 DIAGNOSIS — G35 Multiple sclerosis: Secondary | ICD-10-CM

## 2012-10-27 DIAGNOSIS — M6281 Muscle weakness (generalized): Secondary | ICD-10-CM | POA: Diagnosis not present

## 2012-10-27 DIAGNOSIS — I10 Essential (primary) hypertension: Secondary | ICD-10-CM

## 2012-10-27 DIAGNOSIS — S79919A Unspecified injury of unspecified hip, initial encounter: Secondary | ICD-10-CM | POA: Diagnosis not present

## 2012-10-27 DIAGNOSIS — R339 Retention of urine, unspecified: Secondary | ICD-10-CM | POA: Diagnosis present

## 2012-10-27 DIAGNOSIS — S0990XA Unspecified injury of head, initial encounter: Secondary | ICD-10-CM | POA: Diagnosis not present

## 2012-10-27 DIAGNOSIS — IMO0002 Reserved for concepts with insufficient information to code with codable children: Secondary | ICD-10-CM | POA: Diagnosis not present

## 2012-10-27 LAB — COMPREHENSIVE METABOLIC PANEL
ALT: 38 U/L — ABNORMAL HIGH (ref 0–35)
AST: 22 U/L (ref 0–37)
CO2: 24 mEq/L (ref 19–32)
Chloride: 107 mEq/L (ref 96–112)
Creatinine, Ser: 0.8 mg/dL (ref 0.50–1.10)
GFR calc Af Amer: >90 mL/min (ref >90)
GFR calc non Af Amer: 88 mL/min — ABNORMAL LOW (ref >90)
Glucose, Bld: 123 mg/dL — ABNORMAL HIGH (ref 70–99)
Total Bilirubin: 0.4 mg/dL (ref 0.3–1.2)

## 2012-10-27 LAB — CBC WITH DIFFERENTIAL/PLATELET
Basophils Absolute: 0 10*3/uL (ref 0.0–0.1)
Eosinophils Relative: 2 % (ref 0–5)
HCT: 40.6 % (ref 36.0–46.0)
Hemoglobin: 14.1 g/dL (ref 12.0–15.0)
Lymphocytes Relative: 25 % (ref 12–46)
Lymphs Abs: 2.6 10*3/uL (ref 0.7–4.0)
MCV: 89 fL (ref 78.0–100.0)
Monocytes Absolute: 0.7 10*3/uL (ref 0.1–1.0)
Neutro Abs: 6.8 10*3/uL (ref 1.7–7.7)
RBC: 4.56 MIL/uL (ref 3.87–5.11)
RDW: 12.6 % (ref 11.5–15.5)
WBC: 10.3 10*3/uL (ref 4.0–10.5)

## 2012-10-27 LAB — PROTIME-INR: INR: 0.88 (ref 0.00–1.49)

## 2012-10-27 MED ORDER — DOCUSATE SODIUM 100 MG PO CAPS
100.0000 mg | ORAL_CAPSULE | Freq: Two times a day (BID) | ORAL | Status: DC
  Administered 2012-10-27 – 2012-10-29 (×4): 100 mg via ORAL
  Filled 2012-10-27 (×5): qty 1

## 2012-10-27 MED ORDER — ONDANSETRON HCL 4 MG/2ML IJ SOLN
4.0000 mg | Freq: Once | INTRAMUSCULAR | Status: AC
  Administered 2012-10-27: 4 mg via INTRAVENOUS
  Filled 2012-10-27: qty 2

## 2012-10-27 MED ORDER — IOHEXOL 300 MG/ML  SOLN
100.0000 mL | Freq: Once | INTRAMUSCULAR | Status: AC | PRN
  Administered 2012-10-27: 100 mL via INTRAVENOUS

## 2012-10-27 MED ORDER — LORAZEPAM 2 MG/ML IJ SOLN
1.0000 mg | Freq: Once | INTRAMUSCULAR | Status: DC
Start: 2012-10-27 — End: 2012-10-29
  Filled 2012-10-27: qty 1

## 2012-10-27 MED ORDER — BUDESONIDE-FORMOTEROL FUMARATE 80-4.5 MCG/ACT IN AERO
2.0000 | INHALATION_SPRAY | Freq: Two times a day (BID) | RESPIRATORY_TRACT | Status: DC
  Administered 2012-10-28 (×2): 2 via RESPIRATORY_TRACT
  Filled 2012-10-27: qty 6.9

## 2012-10-27 MED ORDER — ONDANSETRON HCL 4 MG/2ML IJ SOLN
4.0000 mg | Freq: Once | INTRAMUSCULAR | Status: AC
  Administered 2012-10-27: 4 mg via INTRAVENOUS

## 2012-10-27 MED ORDER — POLYETHYLENE GLYCOL 3350 17 G PO PACK
17.0000 g | PACK | Freq: Every day | ORAL | Status: DC
  Filled 2012-10-27 (×2): qty 1

## 2012-10-27 MED ORDER — ALBUTEROL SULFATE (5 MG/ML) 0.5% IN NEBU: 2.5000 mg | INHALATION_SOLUTION | Freq: Four times a day (QID) | RESPIRATORY_TRACT | Status: DC | PRN

## 2012-10-27 MED ORDER — HYDROCODONE-ACETAMINOPHEN 5-325 MG PO TABS
2.0000 | ORAL_TABLET | ORAL | Status: DC | PRN
  Administered 2012-10-27 – 2012-10-28 (×3): 2 via ORAL
  Filled 2012-10-27 (×3): qty 2

## 2012-10-27 MED ORDER — ALBUTEROL SULFATE HFA 108 (90 BASE) MCG/ACT IN AERS: 2.0000 | INHALATION_SPRAY | Freq: Four times a day (QID) | RESPIRATORY_TRACT | Status: DC | PRN

## 2012-10-27 MED ORDER — POLYETHYLENE GLYCOL 3350 17 G PO PACK
17.0000 g | PACK | Freq: Every day | ORAL | Status: DC
  Filled 2012-10-27 (×3): qty 1

## 2012-10-27 MED ORDER — PANTOPRAZOLE SODIUM 40 MG PO TBEC
80.0000 mg | DELAYED_RELEASE_TABLET | Freq: Two times a day (BID) | ORAL | Status: DC
  Administered 2012-10-27 – 2012-10-29 (×4): 80 mg via ORAL
  Filled 2012-10-27 (×3): qty 1

## 2012-10-27 MED ORDER — SODIUM CHLORIDE 0.9 % IJ SOLN
3.0000 mL | Freq: Two times a day (BID) | INTRAMUSCULAR | Status: DC
  Administered 2012-10-27 – 2012-10-29 (×4): 3 mL via INTRAVENOUS

## 2012-10-27 MED ORDER — ONDANSETRON HCL 4 MG PO TABS: 4.0000 mg | ORAL_TABLET | Freq: Four times a day (QID) | ORAL | Status: DC | PRN

## 2012-10-27 MED ORDER — ENOXAPARIN SODIUM 30 MG/0.3ML ~~LOC~~ SOLN
30.0000 mg | Freq: Two times a day (BID) | SUBCUTANEOUS | Status: DC
  Administered 2012-10-27 – 2012-10-29 (×4): 30 mg via SUBCUTANEOUS
  Filled 2012-10-27 (×6): qty 0.3

## 2012-10-27 MED ORDER — HYDROMORPHONE HCL PF 1 MG/ML IJ SOLN
1.0000 mg | Freq: Once | INTRAMUSCULAR | Status: AC
  Administered 2012-10-27: 1 mg via INTRAVENOUS
  Filled 2012-10-27: qty 1

## 2012-10-27 MED ORDER — SODIUM CHLORIDE 0.9 % IV SOLN: 250.0000 mL | INTRAVENOUS | Status: DC | PRN

## 2012-10-27 MED ORDER — METHOCARBAMOL 100 MG/ML IJ SOLN
1000.0000 mg | Freq: Once | INTRAMUSCULAR | Status: AC
  Administered 2012-10-27: 1000 mg via INTRAVENOUS
  Filled 2012-10-27: qty 10

## 2012-10-27 MED ORDER — NITROGLYCERIN 0.4 MG SL SUBL: 0.4000 mg | SUBLINGUAL_TABLET | SUBLINGUAL | Status: DC | PRN

## 2012-10-27 MED ORDER — GADOBENATE DIMEGLUMINE 529 MG/ML IV SOLN
18.0000 mL | Freq: Once | INTRAVENOUS | Status: AC
  Administered 2012-10-27: 18 mL via INTRAVENOUS

## 2012-10-27 MED ORDER — AMPHETAMINE-DEXTROAMPHET ER 10 MG PO CP24
20.0000 mg | ORAL_CAPSULE | Freq: Every day | ORAL | Status: DC
  Administered 2012-10-27 – 2012-10-29 (×3): 20 mg via ORAL
  Filled 2012-10-27 (×3): qty 2

## 2012-10-27 MED ORDER — PREGABALIN 50 MG PO CAPS: 75.0000 mg | ORAL_CAPSULE | Freq: Two times a day (BID) | ORAL | Status: DC | PRN

## 2012-10-27 MED ORDER — HYDROCORTISONE 1 % EX CREA
TOPICAL_CREAM | Freq: Two times a day (BID) | CUTANEOUS | Status: DC | PRN
  Filled 2012-10-27: qty 28

## 2012-10-27 MED ORDER — DIPHENHYDRAMINE HCL 12.5 MG/5ML PO ELIX: 12.5000 mg | ORAL_SOLUTION | Freq: Four times a day (QID) | ORAL | Status: DC | PRN

## 2012-10-27 MED ORDER — SODIUM CHLORIDE 0.9 % IJ SOLN
3.0000 mL | INTRAMUSCULAR | Status: DC | PRN
  Administered 2012-10-27: 3 mL via INTRAVENOUS

## 2012-10-27 MED ORDER — ONDANSETRON HCL 4 MG/2ML IJ SOLN
4.0000 mg | Freq: Four times a day (QID) | INTRAMUSCULAR | Status: DC | PRN
  Administered 2012-10-27 – 2012-10-28 (×2): 4 mg via INTRAVENOUS
  Filled 2012-10-27 (×3): qty 2

## 2012-10-27 MED ORDER — ONDANSETRON HCL 4 MG/2ML IJ SOLN
INTRAMUSCULAR | Status: AC
  Filled 2012-10-27: qty 2

## 2012-10-27 MED ORDER — ONDANSETRON HCL 4 MG/2ML IJ SOLN: 4.0000 mg | Freq: Once | INTRAMUSCULAR | Status: AC

## 2012-10-27 MED ORDER — LORAZEPAM 2 MG/ML IJ SOLN
1.0000 mg | Freq: Once | INTRAMUSCULAR | Status: AC
  Administered 2012-10-27: 1 mg via INTRAVENOUS
  Filled 2012-10-27: qty 1

## 2012-10-27 MED ORDER — HYDROCHLOROTHIAZIDE 25 MG PO TABS
25.0000 mg | ORAL_TABLET | Freq: Every day | ORAL | Status: DC
Start: 2012-10-27 — End: 2012-10-29
  Administered 2012-10-27 – 2012-10-29 (×3): 25 mg via ORAL
  Filled 2012-10-27 (×3): qty 1

## 2012-10-27 MED ORDER — HYDROCODONE-ACETAMINOPHEN 5-325 MG PO TABS: 0.5000 | ORAL_TABLET | ORAL | Status: DC | PRN

## 2012-10-27 MED ORDER — LORAZEPAM 2 MG/ML IJ SOLN
0.5000 mg | INTRAMUSCULAR | Status: DC | PRN
  Administered 2012-10-27 – 2012-10-28 (×2): 1 mg via INTRAVENOUS
  Filled 2012-10-27: qty 1

## 2012-10-27 MED ORDER — HYDROCODONE-ACETAMINOPHEN 5-325 MG PO TABS: 1.0000 | ORAL_TABLET | ORAL | Status: DC | PRN

## 2012-10-27 MED ORDER — DIPHENHYDRAMINE HCL 50 MG/ML IJ SOLN
12.5000 mg | Freq: Four times a day (QID) | INTRAMUSCULAR | Status: DC | PRN
  Administered 2012-10-27 – 2012-10-28 (×4): 12.5 mg via INTRAVENOUS
  Filled 2012-10-27 (×4): qty 1

## 2012-10-27 MED ORDER — ONDANSETRON HCL 4 MG/2ML IJ SOLN
INTRAMUSCULAR | Status: AC
  Administered 2012-10-27: 4 mg via INTRAVENOUS
  Filled 2012-10-27: qty 2

## 2012-10-27 MED ORDER — HYDROMORPHONE HCL PF 1 MG/ML IJ SOLN
0.5000 mg | INTRAMUSCULAR | Status: DC | PRN
  Administered 2012-10-27 – 2012-10-28 (×4): 0.5 mg via INTRAVENOUS
  Filled 2012-10-27 (×4): qty 1

## 2012-10-27 MED ORDER — KCL IN DEXTROSE-NACL 20-5-0.45 MEQ/L-%-% IV SOLN
Freq: Once | INTRAVENOUS | Status: AC
  Administered 2012-10-27: 06:00:00 via INTRAVENOUS
  Filled 2012-10-27: qty 1000

## 2012-10-27 MED ORDER — KETOROLAC TROMETHAMINE 30 MG/ML IJ SOLN
30.0000 mg | Freq: Once | INTRAMUSCULAR | Status: AC
  Administered 2012-10-27: 30 mg via INTRAVENOUS
  Filled 2012-10-27: qty 1

## 2012-10-27 MED ORDER — LORATADINE 10 MG PO TABS
10.0000 mg | ORAL_TABLET | Freq: Every day | ORAL | Status: DC
  Administered 2012-10-27 – 2012-10-29 (×3): 10 mg via ORAL
  Filled 2012-10-27 (×3): qty 1
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
## 2012-10-27 NOTE — ED Notes (Signed)
MD at bedside. 

## 2012-10-27 NOTE — ED Notes (Signed)
Pt requesting pain and nausea medication, EDP aware  

## 2012-10-27 NOTE — ED Notes (Signed)
Call placed to communication for pt transport to [facility] ED since [facility] is unavailable at this time.
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
## 2012-10-27 NOTE — ED Notes (Signed)
Report given to emily,rn
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
## 2012-10-27 NOTE — ED Notes (Signed)
Dr Manly at bedside 
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
## 2012-10-27 NOTE — Progress Notes (Signed)
47 year old female received from [facility] Ed pt alert awake and oriented x4 made comfortable in with 2 person assist intial assessment done noted weakness to RUE r/t pain and  Decreased sensation 7 heaviness of RLE  C-collar in used  Foley to gravity draining well.  Medicated for pain.and diet ordered .Rn will continue to monitor . Jean Roup Rn

## 2012-10-28 DIAGNOSIS — R209 Unspecified disturbances of skin sensation: Secondary | ICD-10-CM

## 2012-10-28 DIAGNOSIS — G35 Multiple sclerosis: Secondary | ICD-10-CM | POA: Diagnosis not present

## 2012-10-28 DIAGNOSIS — M6281 Muscle weakness (generalized): Secondary | ICD-10-CM | POA: Diagnosis not present

## 2012-10-28 DIAGNOSIS — I10 Essential (primary) hypertension: Secondary | ICD-10-CM | POA: Diagnosis not present

## 2012-10-28 MED ORDER — HYDROCODONE-ACETAMINOPHEN 10-325 MG PO TABS
1.0000 | ORAL_TABLET | ORAL | Status: DC | PRN
  Administered 2012-10-28 – 2012-10-29 (×4): 2 via ORAL
  Filled 2012-10-28 (×4): qty 2

## 2012-10-28 MED ORDER — METHOCARBAMOL 750 MG PO TABS
1500.0000 mg | ORAL_TABLET | Freq: Four times a day (QID) | ORAL | Status: DC
  Administered 2012-10-28 – 2012-10-29 (×5): 1500 mg via ORAL
  Filled 2012-10-28 (×9): qty 2

## 2012-10-28 MED ORDER — ACETAMINOPHEN 325 MG PO TABS
650.0000 mg | ORAL_TABLET | Freq: Four times a day (QID) | ORAL | Status: DC | PRN
  Administered 2012-10-28: 650 mg via ORAL

## 2012-10-28 NOTE — Progress Notes (Signed)
Orthopedic Tech Progress Note Patient Details:  [name] [birth year] [id number]  Ortho Devices Type of Ortho Device: Soft collar Ortho Device/Splint Interventions: Application   Cammer, Mickie Bail 10/28/2012, 9:44 AM
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
## 2012-10-28 NOTE — Progress Notes (Signed)
Orthopedic Tech Progress Note Patient Details:  [name] 07-Dec-1965 [id number]  Ortho Devices Type of Ortho Device: Soft collar Ortho Device/Splint Location: neck Ortho Device/Splint Interventions: Ordered;Application   Jennye Moccasin 10/28/2012, 4:43 PM
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
## 2012-10-28 NOTE — Progress Notes (Signed)
Upon assessment, pt voiced concern about R hip pain.  MD notified and CT ordered.  Pt was made aware that C collar had been D/C but pt prefers to wear it while asleep for support.

## 2012-10-28 NOTE — Progress Notes (Signed)
Patient examined and I agree with the assessment and plan  Violeta Gelinas, MD, MPH, FACS Pager: [phone]  10/28/2012 3:37 PM

## 2012-10-28 NOTE — Progress Notes (Signed)
UR completed 
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
## 2012-10-29 DIAGNOSIS — G35 Multiple sclerosis: Secondary | ICD-10-CM | POA: Diagnosis not present

## 2012-10-29 DIAGNOSIS — S161XXA Strain of muscle, fascia and tendon at neck level, initial encounter: Secondary | ICD-10-CM

## 2012-10-29 DIAGNOSIS — M6281 Muscle weakness (generalized): Secondary | ICD-10-CM | POA: Diagnosis not present

## 2012-10-29 DIAGNOSIS — I10 Essential (primary) hypertension: Secondary | ICD-10-CM | POA: Diagnosis not present

## 2012-10-29 MED ORDER — NAPROXEN 500 MG PO TABS
500.0000 mg | ORAL_TABLET | Freq: Two times a day (BID) | ORAL | Status: DC
  Administered 2012-10-29 (×2): 500 mg via ORAL
  Filled 2012-10-29 (×4): qty 1

## 2012-10-29 MED ORDER — PROMETHAZINE HCL 25 MG/ML IJ SOLN
12.5000 mg | Freq: Four times a day (QID) | INTRAMUSCULAR | Status: DC | PRN
  Administered 2012-10-29 (×2): 12.5 mg via INTRAVENOUS
  Filled 2012-10-29 (×2): qty 1

## 2012-10-29 MED ORDER — TRAMADOL HCL 50 MG PO TABS: 100.0000 mg | ORAL_TABLET | Freq: Four times a day (QID) | ORAL | Status: DC

## 2012-10-29 MED ORDER — TRAMADOL HCL 50 MG PO TABS
100.0000 mg | ORAL_TABLET | Freq: Four times a day (QID) | ORAL | Status: DC
  Administered 2012-10-29 (×2): 100 mg via ORAL
  Filled 2012-10-29 (×3): qty 2

## 2012-10-29 MED ORDER — NAPROXEN 500 MG PO TABS: 500.0000 mg | ORAL_TABLET | Freq: Two times a day (BID) | ORAL | Status: DC

## 2012-10-29 MED ORDER — METHOCARBAMOL 500 MG PO TABS: 500.0000 mg | ORAL_TABLET | Freq: Four times a day (QID) | ORAL | Status: DC | PRN

## 2012-10-29 MED ORDER — PROMETHAZINE HCL 12.5 MG PO TABS: 12.5000 mg | ORAL_TABLET | Freq: Four times a day (QID) | ORAL | Status: DC | PRN

## 2012-10-29 MED ORDER — ONDANSETRON HCL 4 MG PO TABS: 4.0000 mg | ORAL_TABLET | Freq: Four times a day (QID) | ORAL | Status: DC | PRN

## 2012-10-29 MED ORDER — HYDROCODONE-ACETAMINOPHEN 10-325 MG PO TABS: 1.0000 | ORAL_TABLET | ORAL | Status: DC | PRN

## 2012-10-29 NOTE — Progress Notes (Signed)
Walking down the hall with PT and a walker Ready for D/C with DME - walker, 3 in 1 Patient examined and I agree with the assessment and plan  Violeta Gelinas, MD, MPH, FACS Pager: [phone]  10/29/2012 12:11 PM
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
## 2012-10-29 NOTE — Progress Notes (Signed)
HHPT/OT arranged with [facility] per pt choice.  DME to be provided from [facility] also.  Address and phone number in EPIC confirmed as correct.

## 2012-10-29 NOTE — Discharge Summary (Signed)
Coree Brame, MD, MPH, FACS Pager: [phone]  

## 2012-10-29 NOTE — Progress Notes (Signed)
Pt discharge orders received. Follow up information, education, and medications reviewed with pt. To be discharged home with roommate with home health services and a 3n1 & rolling walker. Takyra Cantrall, [place] Marie,RN
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
## 2012-11-02 ENCOUNTER — Encounter: Payer: Self-pay | Admitting: Family Medicine

## 2012-11-02 ENCOUNTER — Ambulatory Visit (INDEPENDENT_AMBULATORY_CARE_PROVIDER_SITE_OTHER): Payer: Medicare Other | Admitting: Family Medicine

## 2012-11-02 VITALS — BP 126/84 | HR 81 | Temp 97.4°F | Resp 18 | Wt 196.0 lb

## 2012-11-02 DIAGNOSIS — S139XXA Sprain of joints and ligaments of unspecified parts of neck, initial encounter: Secondary | ICD-10-CM | POA: Diagnosis not present

## 2012-11-02 DIAGNOSIS — S7001XA Contusion of right hip, initial encounter: Secondary | ICD-10-CM

## 2012-11-02 DIAGNOSIS — R339 Retention of urine, unspecified: Secondary | ICD-10-CM

## 2012-11-02 DIAGNOSIS — S233XXA Sprain of ligaments of thoracic spine, initial encounter: Secondary | ICD-10-CM

## 2012-11-02 DIAGNOSIS — S161XXA Strain of muscle, fascia and tendon at neck level, initial encounter: Secondary | ICD-10-CM

## 2012-11-02 DIAGNOSIS — M25519 Pain in unspecified shoulder: Secondary | ICD-10-CM

## 2012-11-02 DIAGNOSIS — G35 Multiple sclerosis: Secondary | ICD-10-CM

## 2012-11-02 DIAGNOSIS — M25511 Pain in right shoulder: Secondary | ICD-10-CM

## 2012-11-02 DIAGNOSIS — S7000XA Contusion of unspecified hip, initial encounter: Secondary | ICD-10-CM | POA: Diagnosis not present

## 2012-11-02 DIAGNOSIS — S239XXA Sprain of unspecified parts of thorax, initial encounter: Secondary | ICD-10-CM

## 2012-11-02 DIAGNOSIS — R29898 Other symptoms and signs involving the musculoskeletal system: Secondary | ICD-10-CM
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
## 2012-11-03 MED ORDER — ALBUTEROL SULFATE HFA 108 (90 BASE) MCG/ACT IN AERS: 2.0000 | INHALATION_SPRAY | Freq: Four times a day (QID) | RESPIRATORY_TRACT | Status: DC | PRN

## 2012-11-04 ENCOUNTER — Telehealth: Payer: Self-pay
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
## 2012-11-05 ENCOUNTER — Other Ambulatory Visit (INDEPENDENT_AMBULATORY_CARE_PROVIDER_SITE_OTHER): Payer: Medicare Other | Admitting: Family Medicine

## 2012-11-05 ENCOUNTER — Telehealth: Payer: Self-pay

## 2012-11-05 DIAGNOSIS — R339 Retention of urine, unspecified: Secondary | ICD-10-CM | POA: Diagnosis not present

## 2012-11-05 LAB — POCT URINALYSIS DIPSTICK
Glucose, UA: NEGATIVE
Leukocytes, UA: NEGATIVE
Nitrite, UA: NEGATIVE
Urobilinogen, UA: 0.2

## 2012-11-05 LAB — POCT UA - MICROSCOPIC ONLY
Casts, Ur, LPF, POC: NEGATIVE
Yeast, UA: NEGATIVE
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
## 2012-11-07 ENCOUNTER — Telehealth: Payer: Self-pay | Admitting: Family Medicine

## 2012-11-07 MED ORDER — CIPROFLOXACIN HCL 500 MG PO TABS: 500.0000 mg | ORAL_TABLET | Freq: Two times a day (BID) | ORAL | Status: DC

## 2012-11-07 NOTE — Telephone Encounter (Signed)
Call --- preliminary Urine Culture result shows mild UTI.  I will e-scribe prescription for Cipro to pharmacy.

## 2012-11-07 NOTE — Telephone Encounter (Signed)
See lab

## 2012-11-08 NOTE — Telephone Encounter (Signed)
Referrals, Please see if you can bump up patient's appointment with physical therapy at [facility].

## 2012-11-08 NOTE — Progress Notes (Signed)
   [address]   [place], [place]  [address]   [phone]  Subjective:    Patient ID: [name], female    DOB: 21-Aug-1965, 47 y.o.   MRN: [id number]  HPI This 47 y.o. female presents for urine analysis. Urine has been dark for past two days; also having persistent R flank pain.  Recent foley catheterization during hospitalization for urinary retention.  No fever/chills/sweats.  No dysuria.    Review of Systems  Constitutional: Negative for chills, diaphoresis and fatigue.  Genitourinary: Positive for urgency, frequency, flank pain, decreased urine volume and pelvic pain. Negative for dysuria.       Objective:   Physical Exam   Results for orders placed in visit on 11/05/12  URINE CULTURE      Result Value Range   Colony Count 70,000 COLONIES/ML     Preliminary Report ESCHERICHIA COLI    POCT UA - MICROSCOPIC ONLY      Result Value Range   WBC, Ur, HPF, POC 0-1     RBC, urine, microscopic 0--2     Bacteria, U Microscopic 1 plus     Mucus, UA trace     Epithelial cells, urine per micros 2-6     Crystals, Ur, HPF, POC neg     Casts, Ur, LPF, POC neg     Yeast, UA neg    POCT URINALYSIS DIPSTICK      Result Value Range   Color, UA yellow     Clarity, UA cloudy     Glucose, UA neg     Bilirubin, UA neg     Ketones, UA neg     Spec Grav, UA <=1.005     Blood, UA trace-lysed     pH, UA 6.0     Protein, UA neg     Urobilinogen, UA 0.2     Nitrite, UA neg     Leukocytes, UA Negative          Assessment & Plan:  Urinary retention - Plan: POCT UA - Microscopic Only, POCT urinalysis dipstick, Urine culture

## 2012-11-09 ENCOUNTER — Ambulatory Visit: Payer: Medicare Other | Admitting: Family Medicine

## 2012-11-09 LAB — URINE CULTURE: Colony Count: 70000

## 2012-11-11 DIAGNOSIS — M719 Bursopathy, unspecified: Secondary | ICD-10-CM | POA: Diagnosis not present

## 2012-11-11 DIAGNOSIS — M542 Cervicalgia: Secondary | ICD-10-CM | POA: Diagnosis not present

## 2012-11-11 DIAGNOSIS — M67919 Unspecified disorder of synovium and tendon, unspecified shoulder: Secondary | ICD-10-CM | POA: Diagnosis not present

## 2012-11-13 DIAGNOSIS — M6281 Muscle weakness (generalized): Secondary | ICD-10-CM | POA: Diagnosis not present

## 2012-11-13 DIAGNOSIS — M545 Low back pain: Secondary | ICD-10-CM | POA: Diagnosis not present

## 2012-11-13 DIAGNOSIS — M5126 Other intervertebral disc displacement, lumbar region: Secondary | ICD-10-CM | POA: Diagnosis not present

## 2012-11-16 ENCOUNTER — Telehealth: Payer: Self-pay

## 2012-11-16 DIAGNOSIS — M542 Cervicalgia: Secondary | ICD-10-CM | POA: Diagnosis not present

## 2012-11-16 DIAGNOSIS — S7000XA Contusion of unspecified hip, initial encounter: Secondary | ICD-10-CM | POA: Diagnosis not present

## 2012-11-16 DIAGNOSIS — M545 Low back pain: Secondary | ICD-10-CM | POA: Diagnosis not present

## 2012-11-16 DIAGNOSIS — M6281 Muscle weakness (generalized): Secondary | ICD-10-CM | POA: Diagnosis not present
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
## 2012-11-16 NOTE — Telephone Encounter (Signed)
Call --- 1. Has she started physical therapy?  If yes, recommend continuing physical therapy for next several weeks.  2.  Does she have follow-up with ortho?  If yes, when?

## 2012-11-17 NOTE — Telephone Encounter (Signed)
Called her, left message for her to call back.

## 2012-11-17 NOTE — Telephone Encounter (Signed)
Patients daughter called back to advise physical therapy has seen for 1 visit only, but did not want to see again until xrays repeated since patient is in so much pain. Dr Alveda Reasons did not get new xrays and does not want new xrays, the daughter states she will bring her back in here, your hours for this week are provided, please advise if anything has changed, because what I told her are different from what the patient was told by phone operator here. Pennye Beeghly

## 2012-11-18 ENCOUNTER — Ambulatory Visit: Payer: Medicare Other

## 2012-11-18 ENCOUNTER — Ambulatory Visit (INDEPENDENT_AMBULATORY_CARE_PROVIDER_SITE_OTHER): Payer: Medicare Other | Admitting: Family Medicine

## 2012-11-18 VITALS — BP 134/85 | HR 89 | Temp 98.4°F | Resp 16 | Ht 61.0 in | Wt 197.2 lb

## 2012-11-18 DIAGNOSIS — R413 Other amnesia: Secondary | ICD-10-CM

## 2012-11-18 DIAGNOSIS — M25519 Pain in unspecified shoulder: Secondary | ICD-10-CM

## 2012-11-18 DIAGNOSIS — B373 Candidiasis of vulva and vagina: Secondary | ICD-10-CM | POA: Diagnosis not present

## 2012-11-18 DIAGNOSIS — M25511 Pain in right shoulder: Secondary | ICD-10-CM

## 2012-11-18 DIAGNOSIS — R51 Headache: Secondary | ICD-10-CM

## 2012-11-18 DIAGNOSIS — S161XXD Strain of muscle, fascia and tendon at neck level, subsequent encounter: Secondary | ICD-10-CM

## 2012-11-18 DIAGNOSIS — N390 Urinary tract infection, site not specified: Secondary | ICD-10-CM

## 2012-11-18 LAB — POCT UA - MICROSCOPIC ONLY
Casts, Ur, LPF, POC: NEGATIVE
Crystals, Ur, HPF, POC: NEGATIVE
Yeast, UA: NEGATIVE

## 2012-11-18 MED ORDER — LORAZEPAM 0.5 MG PO TABS: 0.5000 mg | ORAL_TABLET | Freq: Two times a day (BID) | ORAL | Status: DC | PRN

## 2012-11-18 MED ORDER — HYDROCODONE-ACETAMINOPHEN 10-325 MG PO TABS: 1.0000 | ORAL_TABLET | ORAL | Status: DC | PRN

## 2012-11-18 MED ORDER — FLUCONAZOLE 150 MG PO TABS: 150.0000 mg | ORAL_TABLET | Freq: Once | ORAL | Status: DC

## 2012-11-18 MED ORDER — CIPROFLOXACIN HCL 500 MG PO TABS: 500.0000 mg | ORAL_TABLET | Freq: Two times a day (BID) | ORAL | Status: DC

## 2012-11-18 MED ORDER — METHOCARBAMOL 500 MG PO TABS: 500.0000 mg | ORAL_TABLET | Freq: Four times a day (QID) | ORAL | Status: DC | PRN

## 2012-11-18 NOTE — Patient Instructions (Addendum)
1.  BRAIN REST IS REQUIRED FOR HEALING.  THIS MEANS:  NO READING; NO COMPUTER WORK; NO VIDEO GAMES; NO LARGE SOCIAL GATHERINGS OR PROLONGED GATHERINGS; NO PHONE ACTIVITIES. 2.  DO NOT TAKE ADDERALL. 3.  TAKE ROBAXIN EVERY NIGHT. 4.  CALL WITH NEW PHYSICAL THERAPY FACILITY.
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
## 2012-11-21 LAB — URINE CULTURE: Colony Count: >=100000

## 2012-11-23 ENCOUNTER — Telehealth: Payer: Self-pay

## 2012-11-23 NOTE — Telephone Encounter (Signed)
Pt is calling to get lab results from urine culture done last wednesday

## 2012-11-24 ENCOUNTER — Encounter: Payer: Self-pay | Admitting: Family Medicine

## 2012-11-24 NOTE — Telephone Encounter (Signed)
Patient re-evaluated in office.

## 2012-11-24 NOTE — Telephone Encounter (Signed)
Patient advised and she is improving.

## 2012-11-24 NOTE — Telephone Encounter (Signed)
Patient re-evaluated in office on 11/18/12.

## 2012-11-24 NOTE — Telephone Encounter (Signed)
Can you review urine culture    Value   Culture ESCHERICHIA COLI    Colony Count >=100,000 COLONIES/ML    Organism ID, Bacteria ESCHERICHIA COLI    Resulting Agency [facility]   Culture & Susceptibility    Antibiotic  Organism Organism Organism     ESCHERICHIA COLI     AMPICILLIN  >=32 R Final       AMPICILLIN/SULBACTAM  16 I Final       CEFAZOLIN  <=4 S Final       CEFEPIME  <=1 S Final       CEFOXITIN  <=4 S Final       CEFTAZIDIME  <=1 S Final       CEFTRIAXONE  <=1 S Final       CIPROFLOXACIN  <=0.25 S Final       GENTAMICIN  <=1 S Final       IMIPENEM  <=0.25 S Final       LEVOFLOXACIN  <=0.12 S Final       NITROFURANTOIN  <=16 S Final       PIP/TAZO  <=4 S Final       TOBRAMYCIN  <=1 S Final       TRIMETH/SULFA  <=20 S Final

## 2012-11-24 NOTE — Telephone Encounter (Signed)
Please review urine culture and advise

## 2012-11-24 NOTE — Telephone Encounter (Signed)
Urine culture did confirm infection.  The bacteria should be susceptible to the Cipro that she is taking

## 2012-12-09 ENCOUNTER — Ambulatory Visit (INDEPENDENT_AMBULATORY_CARE_PROVIDER_SITE_OTHER): Payer: Medicare Other | Admitting: Family Medicine

## 2012-12-09 ENCOUNTER — Encounter: Payer: Self-pay | Admitting: Family Medicine

## 2012-12-09 VITALS — BP 134/82 | HR 85 | Temp 98.4°F | Resp 16 | Ht 61.0 in | Wt 198.6 lb

## 2012-12-09 DIAGNOSIS — S161XXD Strain of muscle, fascia and tendon at neck level, subsequent encounter: Secondary | ICD-10-CM

## 2012-12-09 DIAGNOSIS — M25511 Pain in right shoulder: Secondary | ICD-10-CM

## 2012-12-09 DIAGNOSIS — R51 Headache: Secondary | ICD-10-CM | POA: Diagnosis not present

## 2012-12-09 DIAGNOSIS — N390 Urinary tract infection, site not specified: Secondary | ICD-10-CM | POA: Diagnosis not present

## 2012-12-09 DIAGNOSIS — Z5189 Encounter for other specified aftercare: Secondary | ICD-10-CM | POA: Diagnosis not present

## 2012-12-09 DIAGNOSIS — M25519 Pain in unspecified shoulder: Secondary | ICD-10-CM | POA: Diagnosis not present

## 2012-12-09 DIAGNOSIS — R413 Other amnesia: Secondary | ICD-10-CM

## 2012-12-09 LAB — POCT URINALYSIS DIPSTICK
Bilirubin, UA: NEGATIVE
Glucose, UA: NEGATIVE
Spec Grav, UA: 1.01
Urobilinogen, UA: 0.2

## 2012-12-09 LAB — POCT UA - MICROSCOPIC ONLY: Crystals, Ur, HPF, POC: NEGATIVE
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
## 2012-12-13 ENCOUNTER — Encounter: Payer: Self-pay | Admitting: Family Medicine

## 2012-12-17 ENCOUNTER — Encounter: Payer: Self-pay | Admitting: Family Medicine

## 2012-12-22 ENCOUNTER — Ambulatory Visit (INDEPENDENT_AMBULATORY_CARE_PROVIDER_SITE_OTHER): Payer: Medicare Other | Admitting: Neurology

## 2012-12-22 ENCOUNTER — Encounter: Payer: Self-pay | Admitting: Neurology

## 2012-12-22 VITALS — BP 140/94 | HR 93 | Ht 61.0 in | Wt 198.0 lb

## 2012-12-22 DIAGNOSIS — G35 Multiple sclerosis: Secondary | ICD-10-CM | POA: Diagnosis not present

## 2012-12-22 DIAGNOSIS — F909 Attention-deficit hyperactivity disorder, unspecified type: Secondary | ICD-10-CM
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
## 2012-12-23 ENCOUNTER — Encounter: Payer: Self-pay | Admitting: Neurology

## 2012-12-23 ENCOUNTER — Encounter: Payer: Self-pay | Admitting: Family Medicine

## 2012-12-23 ENCOUNTER — Ambulatory Visit (INDEPENDENT_AMBULATORY_CARE_PROVIDER_SITE_OTHER): Payer: Medicare Other | Admitting: Family Medicine

## 2012-12-23 VITALS — BP 132/74 | HR 99 | Temp 97.9°F | Resp 19 | Ht 61.0 in | Wt 198.0 lb

## 2012-12-23 DIAGNOSIS — F419 Anxiety disorder, unspecified: Secondary | ICD-10-CM

## 2012-12-23 DIAGNOSIS — IMO0002 Reserved for concepts with insufficient information to code with codable children: Secondary | ICD-10-CM

## 2012-12-23 DIAGNOSIS — R51 Headache: Secondary | ICD-10-CM

## 2012-12-23 DIAGNOSIS — S43401S Unspecified sprain of right shoulder joint, sequela: Secondary | ICD-10-CM

## 2012-12-23 DIAGNOSIS — F411 Generalized anxiety disorder: Secondary | ICD-10-CM | POA: Diagnosis not present

## 2012-12-23 DIAGNOSIS — R42 Dizziness and giddiness: Secondary | ICD-10-CM

## 2012-12-23 DIAGNOSIS — R413 Other amnesia: Secondary | ICD-10-CM

## 2012-12-23 DIAGNOSIS — R351 Nocturia: Secondary | ICD-10-CM | POA: Diagnosis not present

## 2012-12-23 LAB — POCT UA - MICROSCOPIC ONLY
Casts, Ur, LPF, POC: NEGATIVE
Crystals, Ur, HPF, POC: NEGATIVE
Mucus, UA: POSITIVE

## 2012-12-23 LAB — POCT URINALYSIS DIPSTICK
Bilirubin, UA: NEGATIVE
Ketones, UA: NEGATIVE
Nitrite, UA: NEGATIVE
Spec Grav, UA: 1.02
pH, UA: 5

## 2012-12-23 LAB — CBC WITH DIFFERENTIAL/PLATELET
Basophils Relative: 1 % (ref 0–1)
Eosinophils Absolute: 0.3 10*3/uL (ref 0.0–0.7)
HCT: 46.5 % — ABNORMAL HIGH (ref 36.0–46.0)
Hemoglobin: 15.9 g/dL — ABNORMAL HIGH (ref 12.0–15.0)
MCH: 30.2 pg (ref 26.0–34.0)
MCHC: 34.2 g/dL (ref 30.0–36.0)
Monocytes Absolute: 0.6 10*3/uL (ref 0.1–1.0)
Monocytes Relative: 6 % (ref 3–12)

## 2012-12-23 LAB — COMPREHENSIVE METABOLIC PANEL
Albumin: 4.7 g/dL (ref 3.5–5.2)
Alkaline Phosphatase: 99 U/L (ref 39–117)
BUN: 12 mg/dL (ref 6–23)
Glucose, Bld: 98 mg/dL (ref 70–99)
Total Bilirubin: 0.7 mg/dL (ref 0.3–1.2)

## 2012-12-23 NOTE — Progress Notes (Signed)
[address]   [place], [place]  [address]   [phone]  Subjective:    Patient ID: [name], female    DOB: 09/18/1965, 47 y.o.   MRN: [id number]  HPI This 47 y.o. female presents for two week follow-up:  1.  Memory loss/HA/concussioin: s/p neurology consultation yesterday.  Recommended referral to neuropsychiatry for cognitive testing.  If gets really stressed, will have childlike language.  Head hurts with worsening stress.  Complained of daily headaches to neurologist.  Neurologist felt that all symptoms would resolve.  Did not discuss treatment for headaches.  Did not feel that symptoms due to MS.  Symptoms wax and wane.  Resting after visit yesterday.   Now having daily headaches.  Eyes get glazed.  No seizure activity.  Not sure if anything accomplished anything.  Did not make any recommendations on taking Adderall.  Taking Robaxin 2 tablets at bedtime.  Taking 2 Tramadol at night.  Rarely taking medication for headache.  Always has dull headache severity 3-4/10; will then get stabbing pain with 7/10 pains.  2.  R ankle fracture:  Presented to [facility]; prescribed crutches and CAM walker.  Saw Dr. Veneta Penton.  Follow-up with Nitka for ankle later this week.  Fracture of lateral malleolus medial aspect.  Larey Seat because got dizzy with headache.  Never started two ASA per day.  Prescribed Norco but not taking it.  Taking Ultram and muscle relaxer at night.  Will get terrible cramps at night.    3.  R shoulder strain:  Dr. Otelia Sergeant evaluated R shoulder.  Very thorough exam.    4.  Anxiety:  Discussed panic attacks with neurologist.  Anxiety has improved.  Partner no longer seeing panic attacks.   Now partner notices that having anxiety.  Agrees with increased stress can be contributing.  Partner notices that memory is improving.  Friend does not notice major stressors. Taking Ativan once daily usually mid-morning.  Friend notices major improvement with Ativan.     5.  Nocturia: increasing from baseline.  Bladder hurts. Nocturia x 6 which is new.  Drinking less to avoid nocturia.      Review of Systems  Constitutional: Positive for activity change. Negative for fever, chills, diaphoresis, appetite change and fatigue.  Gastrointestinal: Negative for nausea, vomiting and abdominal pain.  Genitourinary: Positive for frequency and flank pain. Negative for dysuria, urgency and vaginal discharge.  Musculoskeletal: Positive for myalgias, arthralgias and gait problem.  Neurological: Positive for dizziness, light-headedness and headaches. Negative for seizures, syncope, facial asymmetry, speech difficulty, weakness and numbness.  Psychiatric/Behavioral: Positive for decreased concentration. Negative for suicidal ideas, confusion, sleep disturbance, self-injury and dysphoric mood. The patient is nervous/anxious.        Objective:   Physical Exam  Nursing note and vitals reviewed. Constitutional: She is oriented to person, place, and time. She appears well-developed and well-nourished. No distress.  HENT:  Head: Normocephalic and atraumatic.  Mouth/Throat: Oropharynx is clear and moist.  Eyes: Conjunctivae and EOM are normal. Pupils are equal, round, and reactive to light.  Neck: Normal range of motion. Neck supple. No thyromegaly present.  Cardiovascular: Normal rate, regular rhythm and normal heart sounds.  Exam reveals no gallop and no friction rub.   No murmur heard. Pulmonary/Chest: Effort normal and breath sounds normal. She has no wheezes. She has no rales.  Abdominal: Soft. Bowel sounds are normal. She exhibits no distension. There is no tenderness. There is no rebound and no guarding.  Lymphadenopathy:    She has no cervical adenopathy.  Neurological: She is alert and oriented to person, place, and time. No cranial nerve deficit. She exhibits normal muscle tone. Coordination normal.  Skin: Skin is warm and dry. She is not diaphoretic.    Psychiatric: She has a normal mood and affect. Her behavior is normal. Judgment and thought content normal.       Assessment & Plan:  Headache(784.0)  Nocturia - Plan: Urine culture, POCT urinalysis dipstick, POCT UA - Microscopic Only  Anxiety  Shoulder sprain, right, sequela  Memory loss  Dizzy - Plan: CBC with Differential, Comprehensive metabolic panel   1. Headaches:improving but persistent; s/p neurology consultation this week.  Consistent with post-concussive headaches.  Normal neurological exam in office.  Referred for neuropsychological assessment by neurology. 2.  Memory Loss: slowly improving; s/p neurology consultation; concern for underlying psychological component; referred for neuropsychological testing and appropriate at this time. 3.  Anxiety: persistent with mild improvement; major stressors over past several months; continue benzo PRN.   4.  R should sprain: persistent; s/p ortho consultation.   5. Dizziness: persistent; obtain labs to rule out secondary etiologies.  Likely secondary to concussion. 6. Nocturia:  New. Occurring 6 times per night in large volumes; obtain urine and urine culture; if negative, refer to urology. 7.  Concussive syndrome: slowly improving; s/p neurology consultation.  No orders of the defined types were placed in this encounter.

## 2012-12-25 ENCOUNTER — Encounter: Payer: Self-pay | Admitting: Family Medicine

## 2012-12-25 LAB — URINE CULTURE: Colony Count: 25000

## 2012-12-29 ENCOUNTER — Telehealth: Payer: Self-pay | Admitting: Family Medicine

## 2012-12-29 DIAGNOSIS — R358 Other polyuria: Secondary | ICD-10-CM

## 2012-12-29 DIAGNOSIS — R351 Nocturia: Secondary | ICD-10-CM

## 2012-12-29 NOTE — Telephone Encounter (Signed)
Patient has been seen at [facility] in [place] years ago.  Desires urology referral.

## 2012-12-30 ENCOUNTER — Encounter: Payer: Self-pay | Admitting: Family Medicine

## 2012-12-31 ENCOUNTER — Ambulatory Visit: Payer: Medicare Other | Attending: Family Medicine | Admitting: Rehabilitation

## 2012-12-31 DIAGNOSIS — R269 Unspecified abnormalities of gait and mobility: Secondary | ICD-10-CM | POA: Diagnosis not present

## 2012-12-31 DIAGNOSIS — IMO0001 Reserved for inherently not codable concepts without codable children: Secondary | ICD-10-CM | POA: Diagnosis not present

## 2012-12-31 DIAGNOSIS — R42 Dizziness and giddiness: Secondary | ICD-10-CM | POA: Insufficient documentation

## 2012-12-31 DIAGNOSIS — M542 Cervicalgia: Secondary | ICD-10-CM | POA: Insufficient documentation

## 2012-12-31 DIAGNOSIS — M25519 Pain in unspecified shoulder: Secondary | ICD-10-CM | POA: Insufficient documentation

## 2013-01-01 ENCOUNTER — Telehealth: Payer: Self-pay | Admitting: Family Medicine

## 2013-01-01 DIAGNOSIS — Q740 Other congenital malformations of upper limb(s), including shoulder girdle: Secondary | ICD-10-CM | POA: Diagnosis not present

## 2013-01-01 DIAGNOSIS — M25519 Pain in unspecified shoulder: Secondary | ICD-10-CM | POA: Diagnosis not present

## 2013-01-01 DIAGNOSIS — S82891A Other fracture of right lower leg, initial encounter for closed fracture: Secondary | ICD-10-CM

## 2013-01-01 NOTE — Telephone Encounter (Signed)
Patient calling needing referral to Dr. Barbaraann Faster office for ankle fracture.  Has already seen Nitka two times for ankle fracture but insurance needs referral.  Order placed.

## 2013-01-04 ENCOUNTER — Other Ambulatory Visit (INDEPENDENT_AMBULATORY_CARE_PROVIDER_SITE_OTHER): Payer: Self-pay | Admitting: Orthopedic Surgery

## 2013-01-04 ENCOUNTER — Encounter: Payer: Self-pay | Admitting: Family Medicine

## 2013-01-04 ENCOUNTER — Other Ambulatory Visit: Payer: Self-pay | Admitting: Family Medicine

## 2013-01-05 ENCOUNTER — Ambulatory Visit: Payer: Medicare Other | Attending: Family Medicine | Admitting: Rehabilitation

## 2013-01-05 DIAGNOSIS — M542 Cervicalgia: Secondary | ICD-10-CM | POA: Diagnosis not present

## 2013-01-05 DIAGNOSIS — M25519 Pain in unspecified shoulder: Secondary | ICD-10-CM | POA: Insufficient documentation

## 2013-01-05 DIAGNOSIS — IMO0001 Reserved for inherently not codable concepts without codable children: Secondary | ICD-10-CM | POA: Insufficient documentation

## 2013-01-05 DIAGNOSIS — R269 Unspecified abnormalities of gait and mobility: Secondary | ICD-10-CM | POA: Insufficient documentation

## 2013-01-05 DIAGNOSIS — R42 Dizziness and giddiness: Secondary | ICD-10-CM | POA: Insufficient documentation

## 2013-01-06 NOTE — Telephone Encounter (Signed)
Called patient and Pharmacy. We will not be refilling Tramadol 50 mg at this time, patient needs to follow up with her PCP per Nolene Ebbs.

## 2013-01-07 ENCOUNTER — Ambulatory Visit: Payer: Medicare Other | Admitting: Rehabilitation

## 2013-01-07 ENCOUNTER — Other Ambulatory Visit (INDEPENDENT_AMBULATORY_CARE_PROVIDER_SITE_OTHER): Payer: Self-pay

## 2013-01-07 MED ORDER — TRAMADOL HCL 50 MG PO TABS: 100.0000 mg | ORAL_TABLET | Freq: Four times a day (QID) | ORAL | Status: DC

## 2013-01-07 MED ORDER — METHOCARBAMOL 500 MG PO TABS: 500.0000 mg | ORAL_TABLET | Freq: Four times a day (QID) | ORAL | Status: DC | PRN

## 2013-01-07 NOTE — Telephone Encounter (Signed)
Requesting refill on Ultram and Robaxin.  Refill approved.

## 2013-01-09 DIAGNOSIS — M19019 Primary osteoarthritis, unspecified shoulder: Secondary | ICD-10-CM | POA: Diagnosis not present

## 2013-01-12 ENCOUNTER — Ambulatory Visit: Payer: Medicare Other | Admitting: Rehabilitation

## 2013-01-14 ENCOUNTER — Ambulatory Visit: Payer: Medicare Other | Admitting: Rehabilitation

## 2013-01-14 ENCOUNTER — Encounter: Payer: Self-pay | Admitting: Family Medicine

## 2013-01-18 ENCOUNTER — Ambulatory Visit: Payer: Medicare Other | Admitting: Rehabilitation

## 2013-01-18 DIAGNOSIS — G35 Multiple sclerosis: Secondary | ICD-10-CM | POA: Diagnosis not present

## 2013-01-18 DIAGNOSIS — N1330 Unspecified hydronephrosis: Secondary | ICD-10-CM | POA: Diagnosis not present

## 2013-01-18 DIAGNOSIS — N319 Neuromuscular dysfunction of bladder, unspecified: Secondary | ICD-10-CM | POA: Diagnosis not present

## 2013-01-18 DIAGNOSIS — R3129 Other microscopic hematuria: Secondary | ICD-10-CM | POA: Diagnosis not present

## 2013-01-19 ENCOUNTER — Ambulatory Visit: Payer: Medicare Other | Admitting: Rehabilitation

## 2013-01-20 DIAGNOSIS — M25579 Pain in unspecified ankle and joints of unspecified foot: Secondary | ICD-10-CM | POA: Diagnosis not present

## 2013-01-20 DIAGNOSIS — Q740 Other congenital malformations of upper limb(s), including shoulder girdle: Secondary | ICD-10-CM | POA: Diagnosis not present

## 2013-01-20 DIAGNOSIS — M7500 Adhesive capsulitis of unspecified shoulder: Secondary | ICD-10-CM | POA: Diagnosis not present

## 2013-01-20 DIAGNOSIS — M719 Bursopathy, unspecified: Secondary | ICD-10-CM | POA: Diagnosis not present

## 2013-01-20 DIAGNOSIS — M67919 Unspecified disorder of synovium and tendon, unspecified shoulder: Secondary | ICD-10-CM | POA: Diagnosis not present

## 2013-01-21 ENCOUNTER — Ambulatory Visit: Payer: Medicare Other | Admitting: Rehabilitation

## 2013-01-25 ENCOUNTER — Encounter: Payer: Self-pay | Admitting: Family Medicine

## 2013-01-25 ENCOUNTER — Ambulatory Visit (INDEPENDENT_AMBULATORY_CARE_PROVIDER_SITE_OTHER): Payer: Medicare Other | Admitting: Family Medicine

## 2013-01-25 DIAGNOSIS — S060X0D Concussion without loss of consciousness, subsequent encounter: Secondary | ICD-10-CM

## 2013-01-25 DIAGNOSIS — S43401S Unspecified sprain of right shoulder joint, sequela: Secondary | ICD-10-CM

## 2013-01-25 DIAGNOSIS — K219 Gastro-esophageal reflux disease without esophagitis: Secondary | ICD-10-CM

## 2013-01-25 DIAGNOSIS — K7689 Other specified diseases of liver: Secondary | ICD-10-CM | POA: Diagnosis not present

## 2013-01-25 DIAGNOSIS — R51 Headache: Secondary | ICD-10-CM

## 2013-01-25 DIAGNOSIS — F430 Acute stress reaction: Secondary | ICD-10-CM

## 2013-01-25 DIAGNOSIS — N1330 Unspecified hydronephrosis: Secondary | ICD-10-CM | POA: Diagnosis not present

## 2013-01-25 DIAGNOSIS — R7989 Other specified abnormal findings of blood chemistry: Secondary | ICD-10-CM | POA: Diagnosis not present

## 2013-01-25 DIAGNOSIS — R351 Nocturia: Secondary | ICD-10-CM | POA: Diagnosis not present

## 2013-01-25 DIAGNOSIS — R413 Other amnesia: Secondary | ICD-10-CM

## 2013-01-25 LAB — CBC WITH DIFFERENTIAL/PLATELET
Basophils Relative: 1 % (ref 0–1)
Eosinophils Absolute: 0.3 10*3/uL (ref 0.0–0.7)
Eosinophils Relative: 3 % (ref 0–5)
MCH: 30.7 pg (ref 26.0–34.0)
MCHC: 35 g/dL (ref 30.0–36.0)
Neutrophils Relative %: 68 % (ref 43–77)
Platelets: 324 10*3/uL (ref 150–400)
RDW: 13.1 % (ref 11.5–15.5)

## 2013-01-25 LAB — COMPREHENSIVE METABOLIC PANEL
ALT: 34 U/L (ref 0–35)
Alkaline Phosphatase: 94 U/L (ref 39–117)
Creat: 0.78 mg/dL (ref 0.50–1.10)
Glucose, Bld: 106 mg/dL — ABNORMAL HIGH (ref 70–99)
Sodium: 140 mEq/L (ref 135–145)
Total Bilirubin: 0.6 mg/dL (ref 0.3–1.2)
Total Protein: 7.4 g/dL (ref 6.0–8.3)

## 2013-01-25 MED ORDER — DEXLANSOPRAZOLE 60 MG PO CPDR: 60.0000 mg | DELAYED_RELEASE_CAPSULE | Freq: Two times a day (BID) | ORAL | Status: DC

## 2013-01-25 NOTE — Progress Notes (Signed)
[address]   [place], [place]  [address]   [phone]  Subjective:    Patient ID: [name], female    DOB: [birth year], 47 y.o.   MRN: [id number]  HPI This 47 y.o. female presents for one month follow-up of the following:  1.  R shoulder pain: s/p ortho consult; s/p MRI shoulder which revealed bursitis, partial tear; s/p steroid injection last week; PT orders placed.  F/u with Nitka in two weeks.  2.  R ankle fracture: s/p ortho follow-up; transitioned to lace up ankle brace but suffered with a lot of pain; returned to CAM walker for additional week.  3. Urinary frequency/nocturia:  S/p urology consultation by [facility]; really concerned about MS neurogenic bladder; scheduled pt this week for CT scan, urodynamics, cystoscopy.  Continues to urinate excessively qhs.  4.  Concussion: memory has greatly improved over past month.  Headaches have decreased to every other day.  No changes in speech.  Anxiety much improved; using Ativan 2-3 times per week on average.  Dizziness improved but still occurs intermittently.  Scheduled for neuropsychiatric testing this week. No Adderall use since last visit.  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   To start graduate school in 03/2013.  Thinks mentally ready to take tests and start school.   5.  GERD: requesting 90 day supply of Dexilant.  6. Elevated WBC count: present at visit one month ago; presenting for repeat.  7. Elevated LFT: present at last visit; due for repeat.     Review of Systems  Constitutional: Negative for fever, chills, diaphoresis and fatigue.  Eyes: Negative for photophobia and visual disturbance.  Respiratory: Negative for shortness of breath, wheezing and stridor.   Cardiovascular: Negative for chest pain, palpitations and leg swelling.  Genitourinary: Positive for frequency and flank pain. Negative for dysuria, urgency, hematuria and enuresis.  Musculoskeletal: Positive  for myalgias, joint swelling, arthralgias and gait problem.  Neurological: Positive for dizziness, light-headedness and headaches. Negative for tremors, seizures, syncope, facial asymmetry, speech difficulty, weakness and numbness.  Psychiatric/Behavioral: Negative for suicidal ideas, behavioral problems, confusion, sleep disturbance, self-injury, dysphoric mood, decreased concentration and agitation. The patient is nervous/anxious.     Past Medical History  Diagnosis Date  . Hypertension   . GERD (gastroesophageal reflux disease)   . Fluid retention   . DVT (deep venous thrombosis) 2002    left leg  . Celiac sprue     biopsy of small intenstin  . Attention deficit disorder with hyperactivity(314.01)   . IBS (irritable bowel syndrome)   . Benign neoplasm of skin, site unspecified   . Allergic rhinitis, cause unspecified   . Optic neuritis, unspecified     both eye  . Eating disorder, unspecified   . Calculus of kidney   . Tachycardia, unspecified   . Benign neoplasm of colon   . Apnea   . Other chronic allergic conjunctivitis   . Multiple sclerosis   . Tremor   . Symptomatic states associated with artificial menopause   . Anxiety state, unspecified   . Genital herpes, unspecified   . Rosacea   . Unspecified asthma(493.90)   . Heart murmur     Past Surgical History  Procedure Laterality Date  . Abdominal hysterectomy  2008  . Cholecystectomy  2002  . Appendectomy  2005  . Cardiac catheterization  09/2009  . Tonsilectomy, adenoidectomy, bilateral myringotomy and tubes  1987  . Rotator cuff repair  1996    Right  . Nevus resection  06/2010    R upper back; L anterior chest 08/2011.    Prior to Admission medications   Medication Sig Start Date End Date Taking? Authorizing Provider  albuterol (PROVENTIL HFA;VENTOLIN HFA) 108 (90 BASE) MCG/ACT inhaler Inhale 2 puffs into the lungs every 6 (six) hours as needed for wheezing or shortness of breath. 11/03/12   Ethelda Chick,  MD  albuterol (PROVENTIL) (2.5 MG/3ML) 0.083% nebulizer solution Take 3 mLs (2.5 mg total) by nebulization every 6 (six) hours as needed for wheezing. 03/01/12 03/01/13  Ethelda Chick, MD  amphetamine-dextroamphetamine (ADDERALL XR) 20 MG 24 hr capsule Take 1 capsule (20 mg total) by mouth every morning. DO NOT FILL UNTIL 01-03-13. 10/20/12   Ethelda Chick, MD  budesonide-formoterol (SYMBICORT) 80-4.5 MCG/ACT inhaler Inhale 2 puffs into the lungs 2 (two) times daily.      Historical Provider, MD  desonide (DESOWEN) 0.05 % lotion Apply topically 2 (two) times daily. 10/20/12   Ethelda Chick, MD  dexlansoprazole (DEXILANT) 60 MG capsule Take 60 mg by mouth 2 (two) times daily.    Historical Provider, MD  EPINEPHrine (EPIPEN) 0.3 mg/0.3 mL DEVI Inject 0.3 mLs (0.3 mg total) into the muscle once. 03/01/12   Ethelda Chick, MD  fluconazole (DIFLUCAN) 150 MG tablet Take 150 mg by mouth once. Repeat if needed  PER PATIENT TAKE AS NEEDED 11/18/12   Ethelda Chick, MD  hydrochlorothiazide (HYDRODIURIL) 25 MG tablet Take 1 tablet (25 mg total) by mouth daily. 03/01/12   Ethelda Chick, MD  HYDROcodone-acetaminophen (NORCO) 10-325 MG per tablet Take 1-2 tablets by mouth every 4 (four) hours as needed (1 tablet for mild pain, 1.5 tablets for moderate pain, 2 tablets for severe pain). 11/18/12   Ethelda Chick, MD  loratadine (CLARITIN) 10 MG tablet Take 10 mg by mouth daily.    Historical Provider, MD  LORazepam (ATIVAN) 0.5 MG tablet Take 1 tablet (0.5 mg total) by mouth 2 (two) times daily as needed for anxiety. 11/18/12   Ethelda Chick, MD  methocarbamol (ROBAXIN) 500 MG tablet Take 1-2 tablets (500-1,000 mg total) by mouth 4 (four) times daily as needed (Muscle spasm). 01/07/13   Ethelda Chick, MD  naproxen (NAPROSYN) 500 MG tablet Take 1 tablet (500 mg total) by mouth 2 (two) times daily with a meal. 10/29/12   Freeman Caldron, PA-C  nitroGLYCERIN (NITROSTAT) 0.4 MG SL tablet Place 0.4 mg under the tongue every 5  (five) minutes as needed for chest pain. NEED REFILLS 10/30/11 11/18/12  Beatrice Lecher, PA-C  ondansetron (ZOFRAN) 4 MG tablet Take 1 tablet (4 mg total) by mouth every 6 (six) hours as needed for nausea. 10/29/12   Freeman Caldron, PA-C  promethazine (PHENERGAN) 12.5 MG tablet Take 1 tablet (12.5 mg total) by mouth every 6 (six) hours as needed for nausea. 10/29/12   Freeman Caldron, PA-C  traMADol (ULTRAM) 50 MG tablet Take 2 tablets (100 mg total) by mouth every 6 (six) hours. 01/07/13   Ethelda Chick, MD  triamcinolone ointment (KENALOG) 0.1 % Apply topically 2 (two) times daily. Apply to hands 10/20/12   Ethelda Chick, MD  valACYclovir (VALTREX) 500 MG tablet Take 1 tablet (500 mg total) by mouth 2 (two) times daily. Pt states she hasnt taken in 1.5 years 03/01/12   Ethelda Chick, MD    Allergies  Allergen Reactions  . Codeine  REACTION: hallucinations  . Erythromycin     REACTION: seizures  . Levofloxacin     REACTION: hallucinations  . Oxycodone Hcl     REACTION: passed out  . Telithromycin Rash    Generic for Ketek    History   Social History  . Marital Status: Divorced    Spouse Name: N/A    Number of Children: N/A  . Years of Education: college   Occupational History  . disabled     due to MS  . full time student     Nursing   Social History Main Topics  . Smoking status: Never Smoker   . Smokeless tobacco: Never Used  . Alcohol Use: No  . Drug Use: No  . Sexually Active: Not Currently    Birth Control/ Protection: None, Abstinence   Other Topics Concern  . Not on file   Social History Narrative   Divorced since 1998 after 1 year of marriage, first marriage. Caffeine use: carbonated beverage about 4 serving / day. Lives with best friend [name], son, best friend;s two sons in house.. Always uses seat belts. Exercise: Inactive. She graduated from [employer].    Family History  Problem Relation Age of Onset  . Cancer Neg Hx     gyn  . Alcohol  abuse Mother   . Diabetes Mother   . Coronary artery disease Mother     Carotid Artery DIsease  . Hypertension Mother   . Diabetes Father   . Hypertension Father   . Colon cancer    . Kidney Stones Son   . Celiac disease Son        Objective:   Physical Exam  Nursing note and vitals reviewed. Constitutional: She is oriented to person, place, and time. She appears well-developed and well-nourished. No distress.  HENT:  Head: Normocephalic and atraumatic.  Mouth/Throat: Oropharynx is clear and moist.  Eyes: Conjunctivae and EOM are normal. Pupils are equal, round, and reactive to light.  Neck: Normal range of motion. Neck supple. No thyromegaly present.  Cardiovascular: Normal rate, regular rhythm and normal heart sounds.  Exam reveals no gallop and no friction rub.   No murmur heard. Pulmonary/Chest: Effort normal and breath sounds normal. She has no wheezes. She has no rales.  Musculoskeletal:       Cervical back: Normal.  Lymphadenopathy:    She has no cervical adenopathy.  Neurological: She is alert and oriented to person, place, and time. She has normal reflexes. No cranial nerve deficit. She exhibits normal muscle tone. Coordination normal.  Skin: She is not diaphoretic.  Psychiatric: She has a normal mood and affect. Her behavior is normal. Judgment and thought content normal.       Assessment & Plan:  Nocturia - Plan: CBC with Differential, Comprehensive metabolic panel  Shoulder sprain, right, sequela  Memory loss  Elevated liver function tests  G E R D  Stress reaction  Concussion with no loss of consciousness, subsequent encounter  Headache(784.0)  1. Concussion: improved significantly in past month; s/p neurology consult; scheduled for neuropsychiatric testing this week.  Headaches improving; cognitive function greatly improved; dizziness improved; emotionally stable at this time.  Can restart Adderall. 2.  Memory Loss: improved.  Scheduled for  neuropsychiatric testing this week.   3.  Headaches: post-concussive; improving.  Normal neurological exam; s/p neurology consultation in past three months. 4.  R shoulder pain: stable; s/p ortho consult with MRI and injection; continue PT. 5.  GERD: stable; refill  of Dexilant provided. 6.  Stress reaction: improving.  Wean Ativan over next month. 7.  Elevated LFTs and WBC: new; repeat today.  Asymptomatic.

## 2013-01-26 ENCOUNTER — Ambulatory Visit: Payer: Medicare Other | Admitting: Rehabilitation

## 2013-01-26 DIAGNOSIS — N319 Neuromuscular dysfunction of bladder, unspecified: Secondary | ICD-10-CM | POA: Diagnosis not present

## 2013-01-26 DIAGNOSIS — N1330 Unspecified hydronephrosis: Secondary | ICD-10-CM | POA: Diagnosis not present

## 2013-01-27 DIAGNOSIS — S060X0A Concussion without loss of consciousness, initial encounter: Secondary | ICD-10-CM

## 2013-01-27 DIAGNOSIS — F0781 Postconcussional syndrome: Secondary | ICD-10-CM | POA: Diagnosis not present

## 2013-01-27 DIAGNOSIS — X58XXXA Exposure to other specified factors, initial encounter: Secondary | ICD-10-CM | POA: Diagnosis not present

## 2013-01-28 ENCOUNTER — Ambulatory Visit: Payer: Medicare Other | Admitting: Rehabilitation

## 2013-01-29 ENCOUNTER — Encounter: Payer: Self-pay | Admitting: Family Medicine

## 2013-02-01 DIAGNOSIS — G35 Multiple sclerosis: Secondary | ICD-10-CM | POA: Diagnosis not present

## 2013-02-01 DIAGNOSIS — R3129 Other microscopic hematuria: Secondary | ICD-10-CM | POA: Diagnosis not present

## 2013-02-01 DIAGNOSIS — N319 Neuromuscular dysfunction of bladder, unspecified: Secondary | ICD-10-CM | POA: Diagnosis not present

## 2013-02-01 DIAGNOSIS — N1330 Unspecified hydronephrosis: Secondary | ICD-10-CM | POA: Diagnosis not present

## 2013-02-02 ENCOUNTER — Ambulatory Visit: Payer: Medicare Other | Admitting: Rehabilitation

## 2013-02-04 ENCOUNTER — Ambulatory Visit: Payer: Medicare Other | Admitting: Rehabilitation

## 2013-02-08 ENCOUNTER — Encounter: Payer: Self-pay | Admitting: Family Medicine

## 2013-02-08 DIAGNOSIS — S060X0A Concussion without loss of consciousness, initial encounter: Secondary | ICD-10-CM | POA: Diagnosis not present

## 2013-02-08 DIAGNOSIS — F4310 Post-traumatic stress disorder, unspecified: Secondary | ICD-10-CM | POA: Diagnosis not present

## 2013-02-08 DIAGNOSIS — F0781 Postconcussional syndrome: Secondary | ICD-10-CM | POA: Diagnosis not present

## 2013-02-10 ENCOUNTER — Ambulatory Visit: Payer: Medicare Other | Attending: Family Medicine | Admitting: Rehabilitation

## 2013-02-10 DIAGNOSIS — M542 Cervicalgia: Secondary | ICD-10-CM | POA: Diagnosis not present

## 2013-02-10 DIAGNOSIS — M25519 Pain in unspecified shoulder: Secondary | ICD-10-CM | POA: Diagnosis not present

## 2013-02-10 DIAGNOSIS — R42 Dizziness and giddiness: Secondary | ICD-10-CM | POA: Insufficient documentation

## 2013-02-10 DIAGNOSIS — R269 Unspecified abnormalities of gait and mobility: Secondary | ICD-10-CM | POA: Diagnosis not present

## 2013-02-10 DIAGNOSIS — IMO0001 Reserved for inherently not codable concepts without codable children: Secondary | ICD-10-CM | POA: Insufficient documentation

## 2013-02-11 DIAGNOSIS — M7500 Adhesive capsulitis of unspecified shoulder: Secondary | ICD-10-CM | POA: Diagnosis not present

## 2013-02-12 ENCOUNTER — Ambulatory Visit: Payer: Medicare Other | Admitting: Rehabilitation

## 2013-02-12 DIAGNOSIS — R42 Dizziness and giddiness: Secondary | ICD-10-CM | POA: Diagnosis not present

## 2013-02-12 DIAGNOSIS — M25519 Pain in unspecified shoulder: Secondary | ICD-10-CM | POA: Diagnosis not present

## 2013-02-12 DIAGNOSIS — M542 Cervicalgia: Secondary | ICD-10-CM | POA: Diagnosis not present

## 2013-02-12 DIAGNOSIS — R269 Unspecified abnormalities of gait and mobility: Secondary | ICD-10-CM | POA: Diagnosis not present

## 2013-02-12 DIAGNOSIS — IMO0001 Reserved for inherently not codable concepts without codable children: Secondary | ICD-10-CM | POA: Diagnosis not present

## 2013-02-17 ENCOUNTER — Ambulatory Visit: Payer: Medicare Other | Admitting: Rehabilitation

## 2013-02-17 DIAGNOSIS — M542 Cervicalgia: Secondary | ICD-10-CM | POA: Diagnosis not present

## 2013-02-17 DIAGNOSIS — R269 Unspecified abnormalities of gait and mobility: Secondary | ICD-10-CM | POA: Diagnosis not present

## 2013-02-17 DIAGNOSIS — IMO0001 Reserved for inherently not codable concepts without codable children: Secondary | ICD-10-CM | POA: Diagnosis not present

## 2013-02-17 DIAGNOSIS — M25519 Pain in unspecified shoulder: Secondary | ICD-10-CM | POA: Diagnosis not present

## 2013-02-17 DIAGNOSIS — R42 Dizziness and giddiness: Secondary | ICD-10-CM | POA: Diagnosis not present

## 2013-02-18 ENCOUNTER — Ambulatory Visit: Payer: Medicare Other | Admitting: Rehabilitation

## 2013-02-22 ENCOUNTER — Ambulatory Visit (INDEPENDENT_AMBULATORY_CARE_PROVIDER_SITE_OTHER): Payer: Medicare Other | Admitting: Family Medicine

## 2013-02-22 ENCOUNTER — Encounter: Payer: Self-pay | Admitting: Family Medicine

## 2013-02-22 VITALS — BP 136/84 | HR 92 | Temp 97.5°F | Resp 16 | Ht 62.0 in | Wt 202.0 lb

## 2013-02-22 DIAGNOSIS — D72829 Elevated white blood cell count, unspecified: Secondary | ICD-10-CM | POA: Diagnosis not present

## 2013-02-22 DIAGNOSIS — F0781 Postconcussional syndrome: Secondary | ICD-10-CM

## 2013-02-22 DIAGNOSIS — F909 Attention-deficit hyperactivity disorder, unspecified type: Secondary | ICD-10-CM

## 2013-02-22 DIAGNOSIS — R300 Dysuria: Secondary | ICD-10-CM

## 2013-02-22 DIAGNOSIS — R41840 Attention and concentration deficit: Secondary | ICD-10-CM

## 2013-02-22 DIAGNOSIS — D582 Other hemoglobinopathies: Secondary | ICD-10-CM

## 2013-02-22 DIAGNOSIS — F4310 Post-traumatic stress disorder, unspecified: Secondary | ICD-10-CM

## 2013-02-22 LAB — COMPREHENSIVE METABOLIC PANEL
ALT: 31 U/L (ref 0–35)
AST: 20 U/L (ref 0–37)
Albumin: 4.2 g/dL (ref 3.5–5.2)
CO2: 26 mEq/L (ref 19–32)
Calcium: 9.1 mg/dL (ref 8.4–10.5)
Chloride: 103 mEq/L (ref 96–112)
Potassium: 3.8 mEq/L (ref 3.5–5.3)
Sodium: 137 mEq/L (ref 135–145)
Total Protein: 6.5 g/dL (ref 6.0–8.3)

## 2013-02-22 LAB — POCT URINALYSIS DIPSTICK
Bilirubin, UA: NEGATIVE
Glucose, UA: NEGATIVE
Ketones, UA: NEGATIVE
Nitrite, UA: NEGATIVE
pH, UA: 5.5

## 2013-02-22 LAB — POCT UA - MICROSCOPIC ONLY: Yeast, UA: NEGATIVE

## 2013-02-22 MED ORDER — AMPHETAMINE-DEXTROAMPHET ER 20 MG PO CP24: 20.0000 mg | ORAL_CAPSULE | ORAL | Status: DC

## 2013-02-22 MED ORDER — NITROGLYCERIN 0.4 MG SL SUBL: 0.4000 mg | SUBLINGUAL_TABLET | SUBLINGUAL | Status: DC | PRN

## 2013-02-22 MED ORDER — ALBUTEROL SULFATE HFA 108 (90 BASE) MCG/ACT IN AERS: 2.0000 | INHALATION_SPRAY | Freq: Four times a day (QID) | RESPIRATORY_TRACT | Status: DC | PRN

## 2013-02-22 MED ORDER — LORAZEPAM 0.5 MG PO TABS: 0.5000 mg | ORAL_TABLET | Freq: Two times a day (BID) | ORAL | Status: DC | PRN

## 2013-02-22 NOTE — Progress Notes (Signed)
[address]   [place], [place]  [address]   [phone]  Subjective:    Patient ID: [name], female    DOB: 09-Sep-1965, 47 y.o.   MRN: [id number]  HPI This 47 y.o. female presents for evaluation of the following:  1.  Elevated WBC count: due for repeat labs.  Asymptomatic other than dysuria.    2. Elevated Hemoglobin:  Due for repeat labs.  Asymptomatic; feeling well.    3.  Urinary symptoms:  Horrible odor to urine.  Mild bladder pressure; mild dysuria.  S/p urological evaluation; complete bladder emptying.  Did not diagnose with neurogenic bladder.  Felt CT renal improved.  Would not recommend repeat testing unless had gross hematuria.  Nocturia not explained; recommend stop drinking at 4:00pm.  Will not stop drinking at 4:00pm.  No explanation of volume.  No plumbing issue.  Some intermittent R flank pain.  No reflux.  Recommended qhs self cath post-void.   Rx for samples of Ditropan but did not start samples.  4. Stress reaction:  Completed nursing school exam two days ago; son is expecting first child now.  Admits to persistent anxiety issues.    5.  Immunization record/college forms: to be mailed.  6.  R ankle fracture: Dr. Otelia Sergeant wants to continue PT x 4 weeks; patient going to back off of PT due to starting graduate school.  Rx for Voltaren gel.    7.  R shoulder strain: s/p second shoulder injection; has full range of motion.    8.  ADHD, PTSD, Post-concussive syndrome: s/p neuropsychiatric evaluation since last visit.   Concerned that urine symptoms are psychosomatic.  Anxiety is still there; Dr. Melbourne Abts recommended more long term anxiety medication.  Very concerned about SI on SSRI which caused SI when undergoing treatment for eating disorder in her 45s; taking Ativan with Adderall every day for the past week; with starting Adderall, gets a little edgy.  Dr. Melbourne Abts recommended long term anxiety medication but patient very reluctant to start new medication with graduate  school starting in three weeks.  Feels like doing really well emotionally but realizes that there is still a large anxiety component present.  Would like to continue Ativan daily for the next three months.  Review of Systems  Constitutional: Negative for fever, chills, diaphoresis and fatigue.  Genitourinary: Positive for dysuria, urgency, frequency and flank pain. Negative for hematuria, vaginal discharge, enuresis, genital sores and pelvic pain.  Musculoskeletal: Positive for myalgias, joint swelling and arthralgias.  Neurological: Negative for dizziness, tremors, seizures, syncope, facial asymmetry, speech difficulty, weakness, light-headedness, numbness and headaches.  Psychiatric/Behavioral: Positive for decreased concentration. The patient is nervous/anxious.     Past Medical History  Diagnosis Date  . Hypertension   . GERD (gastroesophageal reflux disease)   . Fluid retention   . DVT (deep venous thrombosis) 2002    left leg  . Celiac sprue     biopsy of small intenstin  . Attention deficit disorder with hyperactivity(314.01)   . IBS (irritable bowel syndrome)   . Benign neoplasm of skin, site unspecified   . Allergic rhinitis, cause unspecified   . Optic neuritis, unspecified     both eye  . Eating disorder, unspecified   . Calculus of kidney   . Tachycardia, unspecified   . Benign neoplasm of colon   . Apnea   . Other chronic allergic conjunctivitis   . Multiple sclerosis   . Tremor   . Symptomatic states associated with artificial  menopause   . Anxiety state, unspecified   . Genital herpes, unspecified   . Rosacea   . Unspecified asthma(493.90)   . Heart murmur     Past Surgical History  Procedure Laterality Date  . Abdominal hysterectomy  2008  . Cholecystectomy  2002  . Appendectomy  2005  . Cardiac catheterization  09/2009  . Tonsilectomy, adenoidectomy, bilateral myringotomy and tubes  1987  . Rotator cuff repair  1996    Right  . Nevus resection   06/2010    R upper back; L anterior chest 08/2011.    Prior to Admission medications   Medication Sig Start Date End Date Taking? Authorizing Provider  albuterol (PROVENTIL HFA;VENTOLIN HFA) 108 (90 BASE) MCG/ACT inhaler Inhale 2 puffs into the lungs every 6 (six) hours as needed for wheezing or shortness of breath. 02/22/13  Yes Ethelda Chick, MD  albuterol (PROVENTIL) (2.5 MG/3ML) 0.083% nebulizer solution Take 3 mLs (2.5 mg total) by nebulization every 6 (six) hours as needed for wheezing. 03/01/12 03/01/13 Yes Ethelda Chick, MD  amphetamine-dextroamphetamine (ADDERALL XR) 20 MG 24 hr capsule Take 1 capsule (20 mg total) by mouth every morning. 02/22/13  Yes Ethelda Chick, MD  budesonide-formoterol [facility]) 80-4.5 MCG/ACT inhaler Inhale 2 puffs into the lungs 2 (two) times daily.     Yes Historical Provider, MD  desonide (DESOWEN) 0.05 % lotion Apply topically 2 (two) times daily. 10/20/12  Yes Ethelda Chick, MD  dexlansoprazole (DEXILANT) 60 MG capsule Take 1 capsule (60 mg total) by mouth 2 (two) times daily. 01/25/13  Yes Ethelda Chick, MD  diclofenac sodium (VOLTAREN) 1 % GEL Apply topically.   Yes Historical Provider, MD  EPINEPHrine (EPIPEN) 0.3 mg/0.3 mL DEVI Inject 0.3 mLs (0.3 mg total) into the muscle once. 03/01/12  Yes Ethelda Chick, MD  fluconazole (DIFLUCAN) 150 MG tablet Take 150 mg by mouth once. Repeat if needed  PER PATIENT TAKE AS NEEDED 11/18/12  Yes Ethelda Chick, MD  hydrochlorothiazide (HYDRODIURIL) 25 MG tablet Take 1 tablet (25 mg total) by mouth daily. 03/01/12  Yes Ethelda Chick, MD  loratadine (CLARITIN) 10 MG tablet Take 10 mg by mouth daily.   Yes Historical Provider, MD  LORazepam (ATIVAN) 0.5 MG tablet Take 1 tablet (0.5 mg total) by mouth 2 (two) times daily as needed for anxiety. 02/22/13  Yes Ethelda Chick, MD  methocarbamol (ROBAXIN) 500 MG tablet Take 1-2 tablets (500-1,000 mg total) by mouth 4 (four) times daily as needed (Muscle spasm). 01/07/13  Yes Ethelda Chick, MD  naproxen (NAPROSYN) 500 MG tablet Take 1 tablet (500 mg total) by mouth 2 (two) times daily with a meal. 10/29/12  Yes Freeman Caldron, PA-C  triamcinolone ointment (KENALOG) 0.1 % Apply topically 2 (two) times daily. Apply to hands 10/20/12  Yes Ethelda Chick, MD  valACYclovir (VALTREX) 500 MG tablet Take 1 tablet (500 mg total) by mouth 2 (two) times daily. Pt states she hasnt taken in 1.5 years 03/01/12  Yes Ethelda Chick, MD  HYDROcodone-acetaminophen [facility]) 10-325 MG per tablet Take 1-2 tablets by mouth every 4 (four) hours as needed (1 tablet for mild pain, 1.5 tablets for moderate pain, 2 tablets for severe pain). 11/18/12   Ethelda Chick, MD  nitroGLYCERIN (NITROSTAT) 0.4 MG SL tablet Place 1 tablet (0.4 mg total) under the tongue every 5 (five) minutes as needed for chest pain. NEED REFILLS 02/22/13 03/14/14  Ethelda Chick, MD  ondansetron (ZOFRAN) 4 MG tablet Take 1 tablet (4 mg total) by mouth every 6 (six) hours as needed for nausea. 10/29/12   Freeman Caldron, PA-C  promethazine (PHENERGAN) 12.5 MG tablet Take 1 tablet (12.5 mg total) by mouth every 6 (six) hours as needed for nausea. 10/29/12   Freeman Caldron, PA-C  traMADol (ULTRAM) 50 MG tablet Take 2 tablets (100 mg total) by mouth every 6 (six) hours. 01/07/13   Ethelda Chick, MD    Allergies  Allergen Reactions  . Codeine     REACTION: hallucinations  . Erythromycin     REACTION: seizures  . Levofloxacin     REACTION: hallucinations  . Oxycodone Hcl     REACTION: passed out  . Telithromycin Rash    Generic for Ketek    History   Social History  . Marital Status: Divorced    Spouse Name: N/A    Number of Children: N/A  . Years of Education: college   Occupational History  . disabled     due to MS  . full time student     Nursing   Social History Main Topics  . Smoking status: Never Smoker   . Smokeless tobacco: Never Used  . Alcohol Use: No  . Drug Use: No  . Sexual Activity: Not  Currently    Birth Control/ Protection: None, Abstinence   Other Topics Concern  . Not on file   Social History Narrative   Divorced since 1998 after 1 year of marriage, first marriage. Caffeine use: carbonated beverage about 4 serving / day. Lives with best friend [name], son, best friend;s two sons in house.. Always uses seat belts. Exercise: Inactive. She graduated from [employer].    Family History  Problem Relation Age of Onset  . Cancer Neg Hx     gyn  . Alcohol abuse Mother   . Diabetes Mother   . Coronary artery disease Mother     Carotid Artery DIsease  . Hypertension Mother   . Diabetes Father   . Hypertension Father   . Colon cancer    . Kidney Stones Son   . Celiac disease Son        Objective:   Physical Exam  Nursing note and vitals reviewed. Constitutional: She is oriented to person, place, and time. She appears well-developed and well-nourished. No distress.  HENT:  Head: Normocephalic and atraumatic.  Eyes: Conjunctivae are normal. Pupils are equal, round, and reactive to light.  Neck: Normal range of motion. Neck supple. No thyromegaly present.  Cardiovascular: Normal rate, regular rhythm and normal heart sounds.   Pulmonary/Chest: Effort normal and breath sounds normal. She has no wheezes. She has no rales.  Lymphadenopathy:    She has no cervical adenopathy.  Neurological: She is alert and oriented to person, place, and time. No cranial nerve deficit. She exhibits normal muscle tone. Coordination normal.  Skin: Skin is warm and dry. No rash noted. She is not diaphoretic.  Psychiatric: She has a normal mood and affect. Her behavior is normal. Judgment and thought content normal.   Results for orders placed in visit on 02/22/13  POCT UA - MICROSCOPIC ONLY      Result Value Range   WBC, Ur, HPF, POC 0-4     RBC, urine, microscopic 0-5     Bacteria, U Microscopic trace     Mucus, UA trace     Epithelial cells, urine per micros 0-5     Crystals,  Ur, HPF, POC neg     Casts, Ur, LPF, POC neg     Yeast, UA neg    POCT URINALYSIS DIPSTICK      Result Value Range   Color, UA yellow     Clarity, UA slightly cloudy     Glucose, UA neg     Bilirubin, UA neg     Ketones, UA neg     Spec Grav, UA 1.010     Blood, UA trace     pH, UA 5.5     Protein, UA neg     Urobilinogen, UA 0.2     Nitrite, UA neg     Leukocytes, UA small (1+)         Assessment & Plan:  ADHD  PTSD (post-traumatic stress disorder)  Post concussive syndrome  Dysuria - Plan: POCT UA - Microscopic Only, POCT urinalysis dipstick, Comprehensive metabolic panel, Urine culture  Leukocytosis, unspecified - Plan: POCT UA - Microscopic Only, POCT urinalysis dipstick, Comprehensive metabolic panel  Elevated hemoglobin - Plan: CBC, Comprehensive metabolic panel  Attention deficit - Plan: amphetamine-dextroamphetamine (ADDERALL XR) 20 MG 24 hr capsule, DISCONTINUED: amphetamine-dextroamphetamine (ADDERALL XR) 20 MG 24 hr capsule, DISCONTINUED: amphetamine-dextroamphetamine (ADDERALL XR) 20 MG 24 hr capsule   1. Post-concussive syndrome: improving; concentration and memory much improved; s/p neuropsychiatric evaluation; felt ADHD and PTSD contributing to current symptoms. 2.  PTSD:  New; agree with not starting SSRI or SSNRI at this time; continue Ativan PRN daily for anxiety.  If persists or worsens, will warrant additional medication and intensive psychotherapy. 3.  ADHD: stable; refill of Adderall XR 20mg  for three months provided. 4.  Dysuria;  New. With odor to urine; send urine culture.   5.  Leukocytosis: Persistent; repeat today. 6.  Elevated Hemoglobin: Persistent; repeat today. 7. Asthma: stable; refill of Albuterol provided. 8.  HTN: worsening; recommend restarting Metoprolol ER 25mg  one daily.   Meds ordered this encounter  Medications  . diclofenac sodium (VOLTAREN) 1 % GEL    Sig: Apply topically.  Marland Kitchen albuterol (PROVENTIL HFA;VENTOLIN HFA) 108 (90  BASE) MCG/ACT inhaler    Sig: Inhale 2 puffs into the lungs every 6 (six) hours as needed for wheezing or shortness of breath.    Dispense:  18 g    Refill:  1  . DISCONTD: amphetamine-dextroamphetamine (ADDERALL XR) 20 MG 24 hr capsule    Sig: Take 1 capsule (20 mg total) by mouth every morning.    Dispense:  30 capsule    Refill:  0  . DISCONTD: amphetamine-dextroamphetamine (ADDERALL XR) 20 MG 24 hr capsule    Sig: Take 1 capsule (20 mg total) by mouth every morning.    Dispense:  30 capsule    Refill:  0    DO NOT FILL UNTIL 03-25-13.  Marland Kitchen amphetamine-dextroamphetamine (ADDERALL XR) 20 MG 24 hr capsule    Sig: Take 1 capsule (20 mg total) by mouth every morning.    Dispense:  30 capsule    Refill:  0    DO NOT FILL UNTIL 04-24-13.  Marland Kitchen LORazepam (ATIVAN) 0.5 MG tablet    Sig: Take 1 tablet (0.5 mg total) by mouth 2 (two) times daily as needed for anxiety.    Dispense:  30 tablet    Refill:  2  . nitroGLYCERIN (NITROSTAT) 0.4 MG SL tablet    Sig: Place 1 tablet (0.4 mg total) under the tongue every 5 (five) minutes as needed for chest pain. NEED REFILLS  Dispense:  30 tablet    Refill:  0

## 2013-02-23 ENCOUNTER — Ambulatory Visit: Payer: Medicare Other | Admitting: Rehabilitation

## 2013-02-23 LAB — URINE CULTURE: Colony Count: 60000

## 2013-02-23 LAB — CBC
Hemoglobin: 14.2 g/dL (ref 12.0–15.0)
MCH: 30.9 pg (ref 26.0–34.0)
MCHC: 34 g/dL (ref 30.0–36.0)
Platelets: 272 10*3/uL (ref 150–400)
RBC: 4.59 MIL/uL (ref 3.87–5.11)

## 2013-02-24 ENCOUNTER — Encounter: Payer: Self-pay | Admitting: Family Medicine

## 2013-03-23 DIAGNOSIS — Z0289 Encounter for other administrative examinations: Secondary | ICD-10-CM

## 2013-03-24 ENCOUNTER — Ambulatory Visit: Payer: Medicare Other | Admitting: Nurse Practitioner

## 2013-04-22 DIAGNOSIS — Z0271 Encounter for disability determination: Secondary | ICD-10-CM

## 2013-05-12 ENCOUNTER — Other Ambulatory Visit: Payer: Self-pay

## 2013-05-12 MED ORDER — ALBUTEROL SULFATE HFA 108 (90 BASE) MCG/ACT IN AERS: 2.0000 | INHALATION_SPRAY | Freq: Four times a day (QID) | RESPIRATORY_TRACT | Status: DC | PRN

## 2013-05-13 ENCOUNTER — Other Ambulatory Visit: Payer: Self-pay

## 2013-06-23 ENCOUNTER — Ambulatory Visit (INDEPENDENT_AMBULATORY_CARE_PROVIDER_SITE_OTHER): Payer: Medicare Other | Admitting: Family Medicine

## 2013-06-23 ENCOUNTER — Encounter: Payer: Self-pay | Admitting: Family Medicine

## 2013-06-23 ENCOUNTER — Ambulatory Visit: Payer: Medicare Other

## 2013-06-23 ENCOUNTER — Telehealth: Payer: Self-pay | Admitting: Family Medicine

## 2013-06-23 VITALS — BP 115/69 | HR 89 | Temp 98.0°F | Resp 16 | Ht 61.5 in | Wt 198.0 lb

## 2013-06-23 DIAGNOSIS — Z23 Encounter for immunization: Secondary | ICD-10-CM | POA: Diagnosis not present

## 2013-06-23 DIAGNOSIS — R1011 Right upper quadrant pain: Secondary | ICD-10-CM | POA: Diagnosis not present

## 2013-06-23 DIAGNOSIS — F4321 Adjustment disorder with depressed mood: Secondary | ICD-10-CM | POA: Diagnosis not present

## 2013-06-23 DIAGNOSIS — R071 Chest pain on breathing: Secondary | ICD-10-CM

## 2013-06-23 DIAGNOSIS — R0781 Pleurodynia: Secondary | ICD-10-CM

## 2013-06-23 DIAGNOSIS — F4323 Adjustment disorder with mixed anxiety and depressed mood: Secondary | ICD-10-CM | POA: Diagnosis not present

## 2013-06-23 DIAGNOSIS — F4320 Adjustment disorder, unspecified: Secondary | ICD-10-CM | POA: Insufficient documentation

## 2013-06-23 MED ORDER — LORAZEPAM 0.5 MG PO TABS: 0.5000 mg | ORAL_TABLET | Freq: Two times a day (BID) | ORAL | Status: DC | PRN
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
## 2013-06-23 NOTE — Telephone Encounter (Signed)
Called patient and asked if she could come into appt. Center at 3:00 instead of 4 because we had a patient called out

## 2013-06-24 ENCOUNTER — Telehealth: Payer: Self-pay | Admitting: Family Medicine

## 2013-06-24 NOTE — Telephone Encounter (Signed)
Dr. Katrinka Blazing,  The hospital requires that you mail a request for release of information.  We put it in the mail today.

## 2013-07-13 ENCOUNTER — Ambulatory Visit (INDEPENDENT_AMBULATORY_CARE_PROVIDER_SITE_OTHER): Payer: Medicare Other | Admitting: Family Medicine

## 2013-07-13 ENCOUNTER — Encounter: Payer: Self-pay | Admitting: Family Medicine

## 2013-07-13 VITALS — BP 128/78 | HR 85 | Temp 97.8°F | Resp 16 | Ht 61.5 in | Wt 199.6 lb

## 2013-07-13 DIAGNOSIS — J45909 Unspecified asthma, uncomplicated: Secondary | ICD-10-CM

## 2013-07-13 DIAGNOSIS — F430 Acute stress reaction: Secondary | ICD-10-CM

## 2013-07-13 DIAGNOSIS — J069 Acute upper respiratory infection, unspecified: Secondary | ICD-10-CM

## 2013-07-13 DIAGNOSIS — R7309 Other abnormal glucose: Secondary | ICD-10-CM

## 2013-07-13 DIAGNOSIS — M79675 Pain in left toe(s): Secondary | ICD-10-CM

## 2013-07-13 DIAGNOSIS — M79609 Pain in unspecified limb: Secondary | ICD-10-CM

## 2013-07-13 DIAGNOSIS — S29011A Strain of muscle and tendon of front wall of thorax, initial encounter: Secondary | ICD-10-CM

## 2013-07-13 DIAGNOSIS — F4321 Adjustment disorder with depressed mood: Secondary | ICD-10-CM | POA: Diagnosis not present

## 2013-07-13 DIAGNOSIS — R41840 Attention and concentration deficit: Secondary | ICD-10-CM

## 2013-07-13 DIAGNOSIS — R739 Hyperglycemia, unspecified: Secondary | ICD-10-CM

## 2013-07-13 DIAGNOSIS — I10 Essential (primary) hypertension: Secondary | ICD-10-CM

## 2013-07-13 LAB — CBC WITH DIFFERENTIAL/PLATELET
Basophils Absolute: 0.1 10*3/uL (ref 0.0–0.1)
Basophils Relative: 1 % (ref 0–1)
EOS ABS: 0.4 10*3/uL (ref 0.0–0.7)
Eosinophils Relative: 4 % (ref 0–5)
HCT: 43.8 % (ref 36.0–46.0)
HEMOGLOBIN: 15.1 g/dL — AB (ref 12.0–15.0)
LYMPHS ABS: 2.6 10*3/uL (ref 0.7–4.0)
Lymphocytes Relative: 24 % (ref 12–46)
MCH: 30.8 pg (ref 26.0–34.0)
MCHC: 34.5 g/dL (ref 30.0–36.0)
MCV: 89.4 fL (ref 78.0–100.0)
Monocytes Absolute: 0.7 10*3/uL (ref 0.1–1.0)
Monocytes Relative: 7 % (ref 3–12)
NEUTROS ABS: 6.9 10*3/uL (ref 1.7–7.7)
NEUTROS PCT: 64 % (ref 43–77)
Platelets: 267 10*3/uL (ref 150–400)
RBC: 4.9 MIL/uL (ref 3.87–5.11)
RDW: 12.9 % (ref 11.5–15.5)
WBC: 10.6 10*3/uL — AB (ref 4.0–10.5)

## 2013-07-13 MED ORDER — AMPHETAMINE-DEXTROAMPHET ER 20 MG PO CP24: 20.0000 mg | ORAL_CAPSULE | ORAL | Status: DC

## 2013-07-13 MED ORDER — HYDROCHLOROTHIAZIDE 25 MG PO TABS: 25.0000 mg | ORAL_TABLET | Freq: Every day | ORAL | Status: DC

## 2013-07-13 MED ORDER — FLUOXETINE HCL 20 MG PO TABS: 20.0000 mg | ORAL_TABLET | Freq: Every day | ORAL | Status: DC

## 2013-07-13 MED ORDER — ALBUTEROL SULFATE HFA 108 (90 BASE) MCG/ACT IN AERS: 2.0000 | INHALATION_SPRAY | Freq: Four times a day (QID) | RESPIRATORY_TRACT | Status: DC | PRN

## 2013-07-13 MED ORDER — AMOXICILLIN 500 MG PO TABS: 1000.0000 mg | ORAL_TABLET | Freq: Two times a day (BID) | ORAL | Status: DC

## 2013-07-13 NOTE — Progress Notes (Signed)
Subjective:    Patient ID: [name], female    DOB: 30-Oct-1965, 48 y.o.   MRN: [id number]  HPI This 48 y.o. female presents for three week follow-up:  1. Stress reaction/grief reaction/anxiety:  Returned from [employer] house two nights ago.  While with mother, uncle placed into Hospice and then died.  Stayed with uncle all night long.  Aunt got admitted to cardiac ICU the following night.  Very stressful trip.  Uncle died of liver failure/cirrhosis.  With travel, tried to stay positive for newly wed son and wife.  Had to help mother deal with clothing, titles.  Sleep deprivation due to critically ill family.  Uncle was not a candidate for liver transplant.  Things drastically worsened/declined.  Emotional strains with family issues is overwhelming.  Also went through medical records of father's with medical family member.  Pt's mother had POA.  Aunt is mentally ill; aunt does have CAD; aunt is anorexic; potassium was 1.5.  Step sister helped pt go through father's belongings.  Some value has developed with stepsister.  Relived father's death going through uncle's funeral.  Pt did everything that mother requested of her.  Mother was very demanding of patient. Has slept a ton for the past week.   Did stay with son and his wife for a few days and had a great visit.   Took Ativan 2-3 times per day during the trip.   Leaving for school Friday/three days. Held up fine physically. Requesting labs/bloodwork.   Has not considered which SSRI she is interested. Very long drive ahead of her.    2.  R sided pain:  Developed in past month; brought in records.  Pain with movement and change in position. No SOB; some coughing lately.  +nausea; no vomiting/diarrhea/constipation. Eating has no effect. No rash.  No urinary symptoms; denies hematuria, urgency, frequency, flank pain.    3. Hyperglycemia: sugar 180 last night after a large meal.  No healthy eating at mother's house.  4. URI: lots of nasal  congestion; ears hurting R; head feels congested.  No medications.    5.  L first toe redness, swelling: one week duration ;first MTP joint.  No family hx of gout.  6. HTN: Patient reports good compliance with medication, good tolerance to medication, and good symptom control.   Needs refill of HCTZ.  7. Asthma: stable; needs refill of Albuterol.    Review of Systems  Constitutional: Negative for fever, chills, diaphoresis and fatigue.  HENT: Positive for congestion, ear pain, postnasal drip and rhinorrhea. Negative for sinus pressure.   Eyes: Negative for visual disturbance.  Respiratory: Negative for cough, shortness of breath, wheezing and stridor.   Cardiovascular: Negative for chest pain, palpitations and leg swelling.  Gastrointestinal: Positive for abdominal pain. Negative for nausea, vomiting, diarrhea, constipation, blood in stool, abdominal distention, anal bleeding and rectal pain.  Endocrine: Negative for cold intolerance, heat intolerance, polydipsia, polyphagia and polyuria.  Musculoskeletal: Positive for arthralgias.  Skin: Positive for color change.  Neurological: Negative for dizziness, tremors, seizures, syncope, facial asymmetry, speech difficulty, weakness, light-headedness, numbness and headaches.  Psychiatric/Behavioral: Positive for dysphoric mood. Negative for suicidal ideas, sleep disturbance and self-injury. The patient is nervous/anxious.    Past Medical History  Diagnosis Date  . Hypertension   . GERD (gastroesophageal reflux disease)   . Fluid retention   . DVT (deep venous thrombosis) 2002    left leg  . Celiac sprue     biopsy of small intenstin  .  Attention deficit disorder with hyperactivity(314.01)   . IBS (irritable bowel syndrome)   . Benign neoplasm of skin, site unspecified   . Allergic rhinitis, cause unspecified   . Optic neuritis, unspecified     both eye  . Eating disorder, unspecified   . Calculus of kidney   . Tachycardia,  unspecified   . Benign neoplasm of colon   . Apnea   . Other chronic allergic conjunctivitis   . Multiple sclerosis   . Tremor   . Symptomatic states associated with artificial menopause   . Anxiety state, unspecified   . Genital herpes, unspecified   . Rosacea   . Unspecified asthma(493.90)   . Heart murmur    Past Surgical History  Procedure Laterality Date  . Abdominal hysterectomy  2008  . Cholecystectomy  2002  . Appendectomy  2005  . Cardiac catheterization  09/2009  . Tonsilectomy, adenoidectomy, bilateral myringotomy and tubes  1987  . Rotator cuff repair  1996    Right  . Nevus resection  06/2010    R upper back; L anterior chest 08/2011.   Allergies  Allergen Reactions  . Codeine     REACTION: hallucinations  . Erythromycin     REACTION: seizures  . Levofloxacin     REACTION: hallucinations  . Oxycodone Hcl     REACTION: passed out  . Telithromycin Rash    Generic for Ketek       Objective:   Physical Exam  Constitutional: She is oriented to person, place, and time. She appears well-developed and well-nourished. No distress.  HENT:  Head: Normocephalic and atraumatic.  Right Ear: External ear normal.  Left Ear: External ear normal.  Nose: Nose normal.  Mouth/Throat: Oropharynx is clear and moist.  Eyes: Conjunctivae and EOM are normal. Pupils are equal, round, and reactive to light.  Neck: Normal range of motion. Neck supple. Carotid bruit is not present. No thyromegaly present.  Cardiovascular: Normal rate, regular rhythm, normal heart sounds and intact distal pulses.  Exam reveals no gallop and no friction rub.   No murmur heard. Pulmonary/Chest: Effort normal and breath sounds normal. She has no wheezes. She has no rales.  Abdominal: Soft. Bowel sounds are normal. She exhibits no distension and no mass. There is no tenderness. There is no rebound and no guarding.  Musculoskeletal:       Right shoulder: Normal.       Left shoulder: Normal.        Cervical back: Normal.       Thoracic back: She exhibits pain.       Lumbar back: Normal. She exhibits normal range of motion, no tenderness and no bony tenderness.  Pain along R lateral side with active ROM.  Lymphadenopathy:    She has no cervical adenopathy.  Neurological: She is alert and oriented to person, place, and time. No cranial nerve deficit.  Skin: Skin is warm and dry. No rash noted. She is not diaphoretic. No erythema. No pallor.  Psychiatric: She has a normal mood and affect. Her behavior is normal.       Assessment & Plan:  Hypertension - Plan: CBC with Differential, COMPLETE METABOLIC PANEL WITH GFR, Uric Acid, DISCONTINUED: hydrochlorothiazide (HYDRODIURIL) 25 MG tablet  Attention deficit - Plan: DISCONTINUED: amphetamine-dextroamphetamine (ADDERALL XR) 20 MG 24 hr capsule, DISCONTINUED: amphetamine-dextroamphetamine (ADDERALL XR) 20 MG 24 hr capsule, DISCONTINUED: amphetamine-dextroamphetamine (ADDERALL XR) 20 MG 24 hr capsule  Stress reaction  Hyperglycemia - Plan: Hemoglobin A1c  Grief reaction  Asthma  Toe pain, left  Acute upper respiratory infections of unspecified site - Plan: Uric Acid  Chest wall muscle strain, initial encounter  1. HTN: controlled; refill of HCTZ provided. 2.  Stress and grief reaction: worsening; counseling provided; agree with starting Prozac 20mg  daily; continue benzo but PRN only.   3.  ADHD: controlled; refills provided. To call in three months for refills.  Follow-up in six months. 4.  Hyperglycemia: worsening; obtain labs. 5.  Asthma: stable.  Refills provided. 6.  URI: New.  Rx for Amoxicillin provided due to recent stressors, travel, etc. 7.  Toe pain L:  New.  Suggestive of gout; benign exam today. RTC for recurrence. 8. R sided pain/R chest wall strain: New.  Worsening with change in position; no associated GI or GU symptoms.  Recommend rest, heat, avoiding lifting.  Meds ordered this encounter  Medications  .  DISCONTD: FLUoxetine (PROZAC) 20 MG tablet    Sig: Take 1 tablet (20 mg total) by mouth daily.    Dispense:  30 tablet    Refill:  5  . DISCONTD: amoxicillin (AMOXIL) 500 MG tablet    Sig: Take 2 tablets (1,000 mg total) by mouth 2 (two) times daily.    Dispense:  40 tablet    Refill:  0  . DISCONTD: hydrochlorothiazide (HYDRODIURIL) 25 MG tablet    Sig: Take 1 tablet (25 mg total) by mouth daily.    Dispense:  30 tablet    Refill:  5  . DISCONTD: albuterol (PROVENTIL HFA;VENTOLIN HFA) 108 (90 BASE) MCG/ACT inhaler    Sig: Inhale 2 puffs into the lungs every 6 (six) hours as needed for wheezing or shortness of breath.    Dispense:  18 g    Refill:  2    PRO-AIR  . DISCONTD: amphetamine-dextroamphetamine (ADDERALL XR) 20 MG 24 hr capsule    Sig: Take 1 capsule (20 mg total) by mouth every morning.    Dispense:  30 capsule    Refill:  0  . DISCONTD: amphetamine-dextroamphetamine (ADDERALL XR) 20 MG 24 hr capsule    Sig: Take 1 capsule (20 mg total) by mouth every morning.    Dispense:  30 capsule    Refill:  0    DO NOT FILL UNTIL 08-13-13.  Marland Kitchen DISCONTD: amphetamine-dextroamphetamine (ADDERALL XR) 20 MG 24 hr capsule    Sig: Take 1 capsule (20 mg total) by mouth every morning.    Dispense:  30 capsule    Refill:  0    DO NOT FILL UNTIL 09-10-13.    Reginia Forts, M.D.  [facility] [address] [place], [place]  [address] [phone] phone [phone] fax

## 2013-07-14 LAB — COMPLETE METABOLIC PANEL WITH GFR
ALBUMIN: 4.1 g/dL (ref 3.5–5.2)
ALT: 49 U/L — AB (ref 0–35)
AST: 27 U/L (ref 0–37)
Alkaline Phosphatase: 101 U/L (ref 39–117)
BUN: 10 mg/dL (ref 6–23)
CHLORIDE: 105 meq/L (ref 96–112)
CO2: 27 meq/L (ref 19–32)
Calcium: 9.1 mg/dL (ref 8.4–10.5)
Creat: 0.57 mg/dL (ref 0.50–1.10)
GFR, Est Non African American: >89 mL/min
GLUCOSE: 76 mg/dL (ref 70–99)
POTASSIUM: 4.2 meq/L (ref 3.5–5.3)
SODIUM: 139 meq/L (ref 135–145)
TOTAL PROTEIN: 6.8 g/dL (ref 6.0–8.3)
Total Bilirubin: 0.4 mg/dL (ref 0.3–1.2)

## 2013-07-14 LAB — URIC ACID: Uric Acid, Serum: 4.2 mg/dL (ref 2.4–7.0)

## 2013-07-14 LAB — HEMOGLOBIN A1C
Hgb A1c MFr Bld: 5.8 % — ABNORMAL HIGH (ref <5.7)
Mean Plasma Glucose: 120 mg/dL — ABNORMAL HIGH (ref <117)

## 2013-07-17 ENCOUNTER — Encounter: Payer: Self-pay | Admitting: Family Medicine

## 2013-07-18 ENCOUNTER — Encounter: Payer: Self-pay | Admitting: Family Medicine

## 2014-02-03 IMAGING — US US ABDOMEN COMPLETE
1 series · 14 of 25 positions shown · non-contrast
Comparison: 05/16/2011

CLINICAL DATA: Right upper quadrant abdominal pain, elevated LFTs,
prior cholecystectomy

ABDOMINAL ULTRASOUND COMPLETE

[Series 1: us abdomen complete · 0.37mm/px · 14 of 74 slices shown]
[im 1/74]
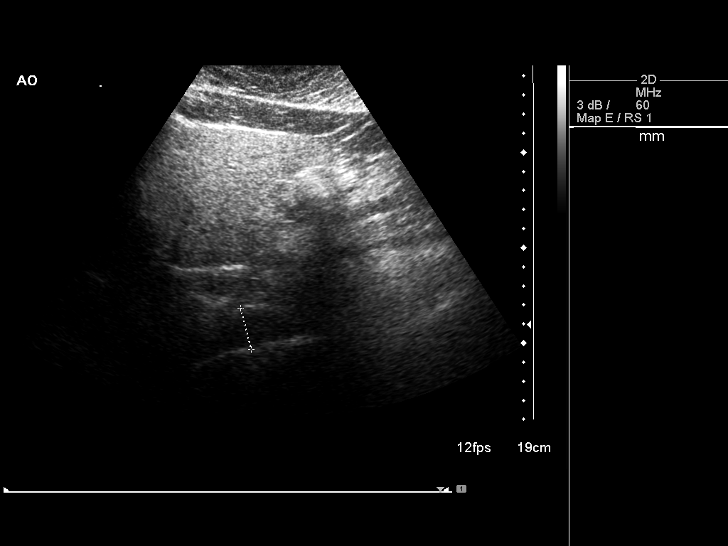
[im 7/74]
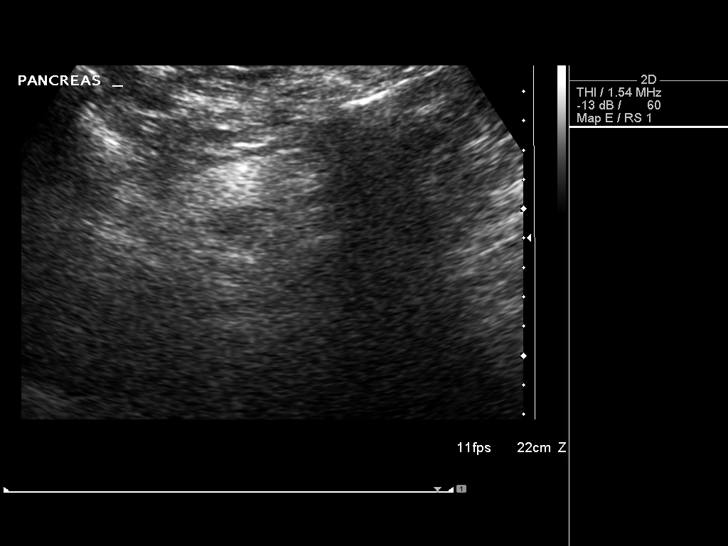
[im 13/74]
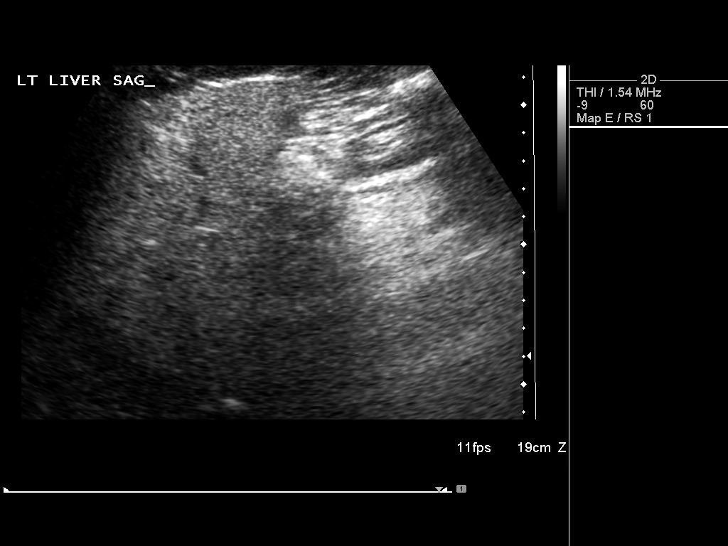
[im 19/74]
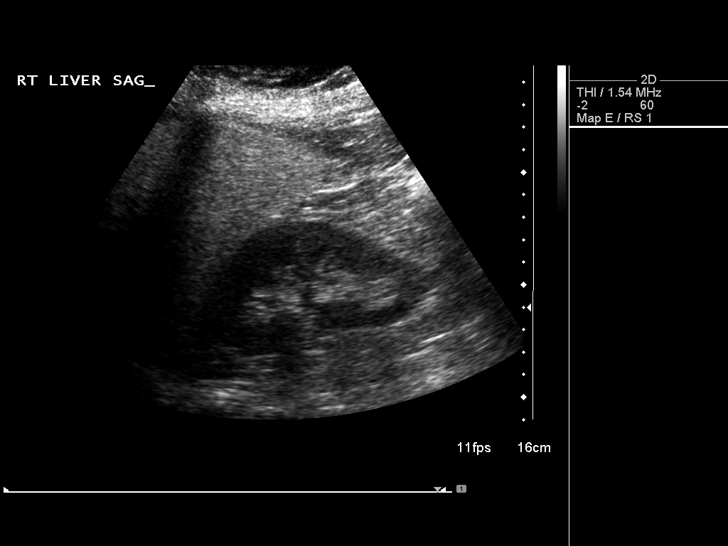
[im 25/74]
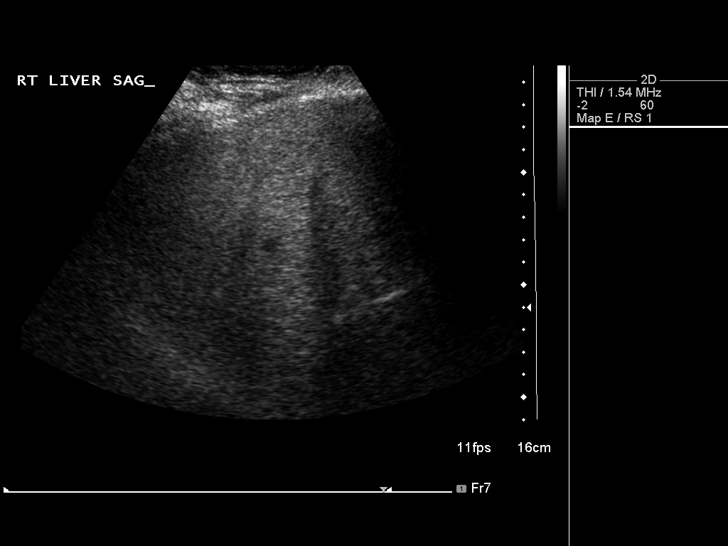
[im 28/74]
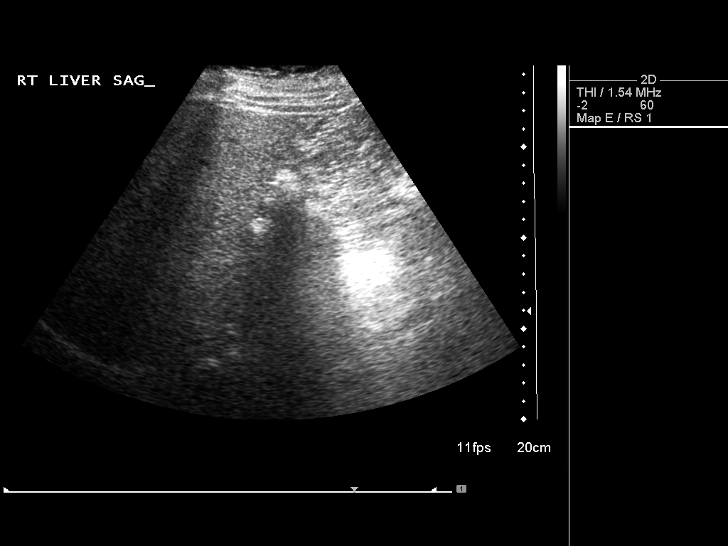
[im 34/74]
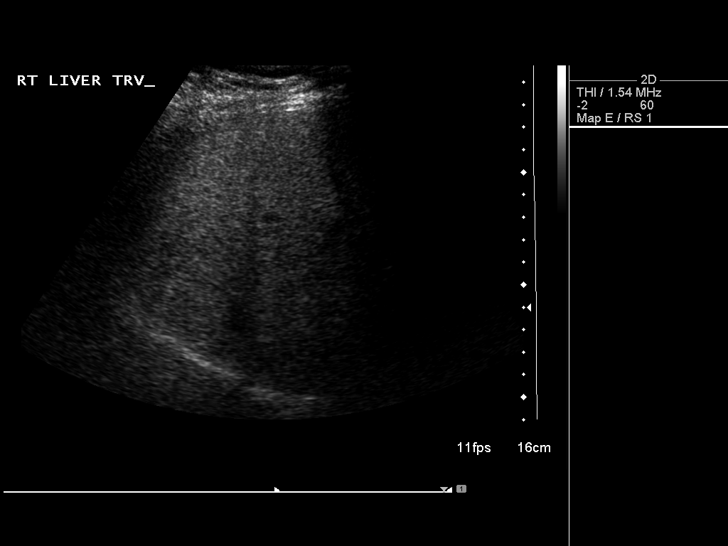
[im 40/74]
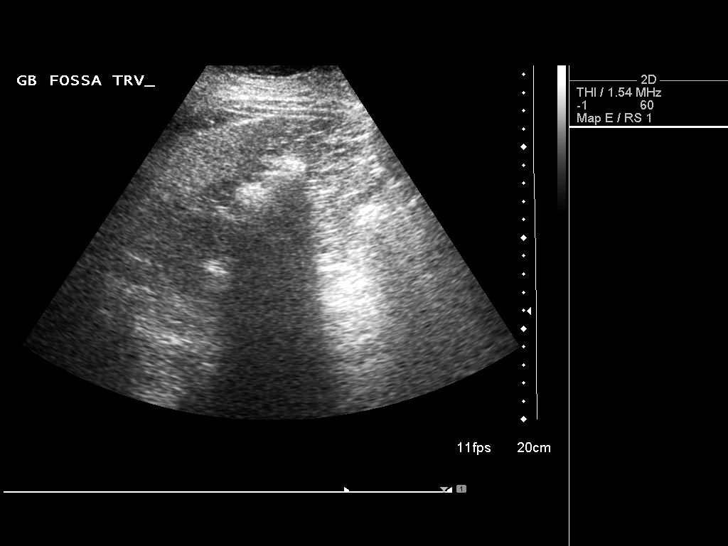
[im 46/74]
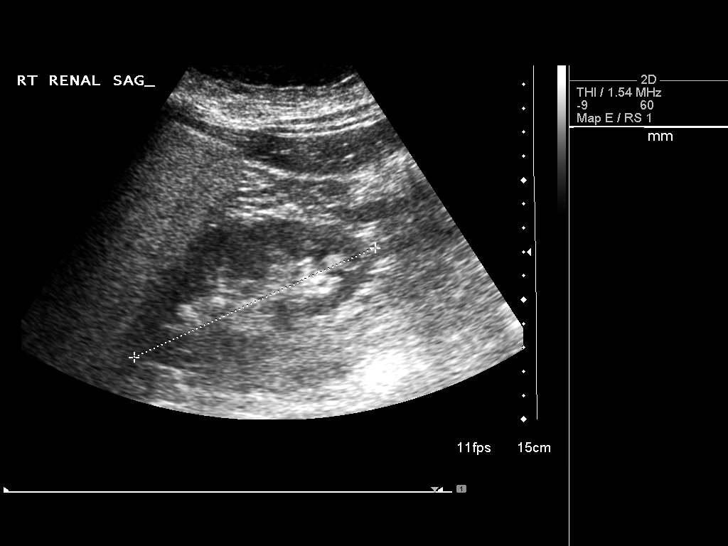
[im 49/74]
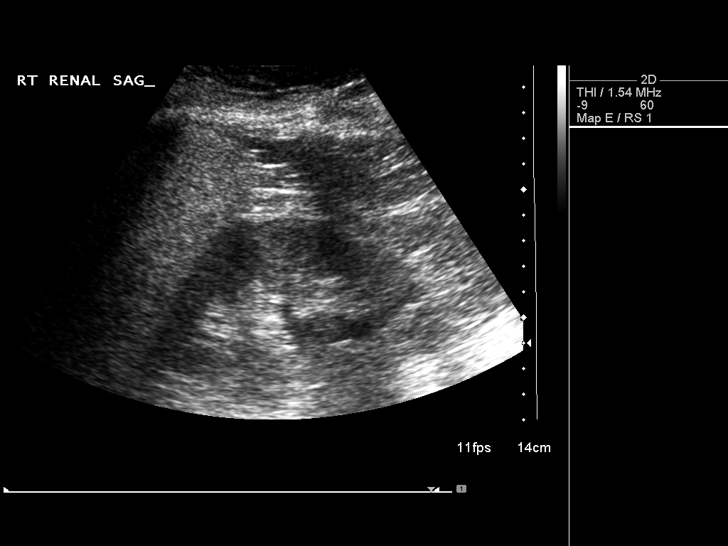
[im 55/74]
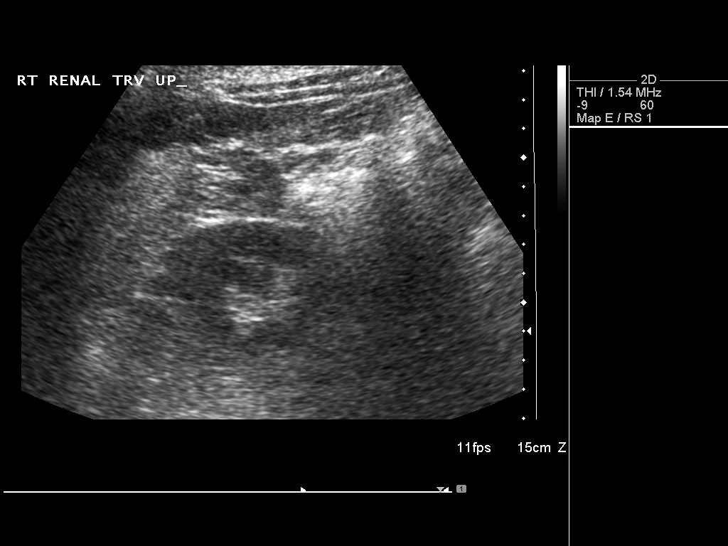
[im 61/74]
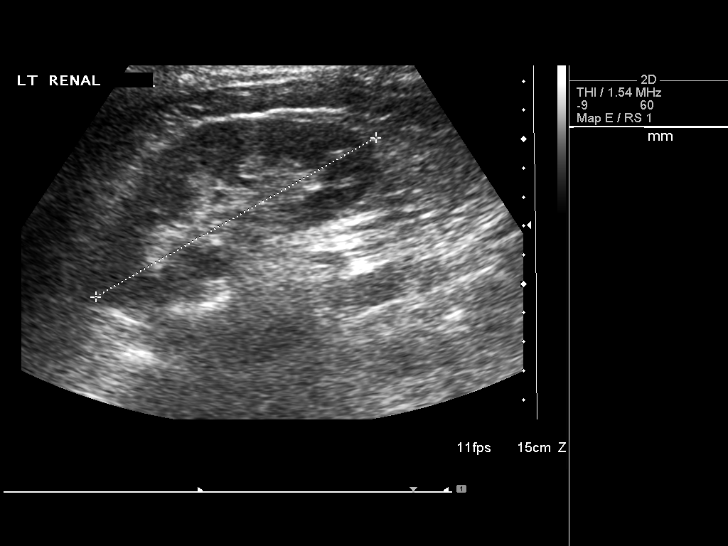
[im 67/74]
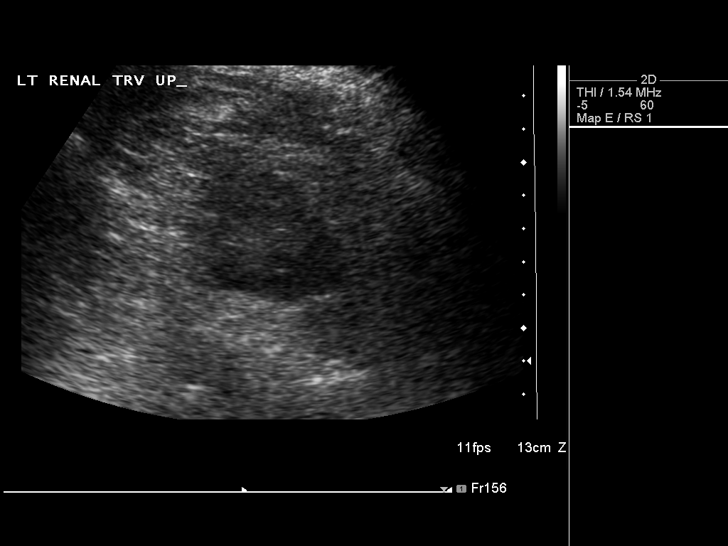
[im 74/74]
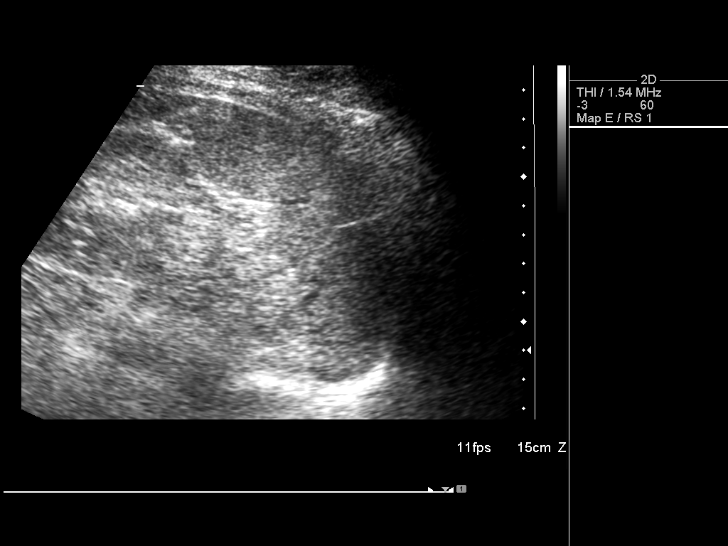

[14 of 25 positions shown; findings below may reference images not displayed]

FINDINGS: Gallbladder:  Surgically removed

Common Bile Duct:  Within normal limits in caliber.

Liver: Heterogeneous coarse increased echotexture compatible with
background hepatic steatosis or fatty infiltration.  No focal
abnormality or biliary dilatation.

IVC:  Appears normal.

Pancreas:  No abnormality identified.

Spleen:  Within normal limits in size and echotexture.

Right kidney:  Normal in size and parenchymal echogenicity.  No
evidence of mass or hydronephrosis.

Left kidney:  Normal in size and parenchymal echogenicity.  No
evidence of mass or hydronephrosis.

Abdominal Aorta:  No aneurysm identified.
IMPRESSION: Hepatic steatosis.

Prior cholecystectomy

No acute finding by ultrasound

## 2014-02-14 ENCOUNTER — Encounter: Payer: Self-pay | Admitting: Family Medicine

## 2014-02-14 ENCOUNTER — Ambulatory Visit (INDEPENDENT_AMBULATORY_CARE_PROVIDER_SITE_OTHER): Payer: Medicare Other | Admitting: Family Medicine

## 2014-02-14 VITALS — BP 130/80 | HR 77 | Temp 98.5°F | Resp 16 | Ht 60.75 in | Wt 200.8 lb

## 2014-02-14 DIAGNOSIS — R41840 Attention and concentration deficit: Secondary | ICD-10-CM

## 2014-02-14 DIAGNOSIS — K760 Fatty (change of) liver, not elsewhere classified: Secondary | ICD-10-CM

## 2014-02-14 DIAGNOSIS — J45909 Unspecified asthma, uncomplicated: Secondary | ICD-10-CM

## 2014-02-14 DIAGNOSIS — A6000 Herpesviral infection of urogenital system, unspecified: Secondary | ICD-10-CM

## 2014-02-14 DIAGNOSIS — R7309 Other abnormal glucose: Secondary | ICD-10-CM | POA: Diagnosis not present

## 2014-02-14 DIAGNOSIS — F4323 Adjustment disorder with mixed anxiety and depressed mood: Secondary | ICD-10-CM

## 2014-02-14 DIAGNOSIS — Z91013 Allergy to seafood: Secondary | ICD-10-CM

## 2014-02-14 DIAGNOSIS — K219 Gastro-esophageal reflux disease without esophagitis: Secondary | ICD-10-CM

## 2014-02-14 DIAGNOSIS — R739 Hyperglycemia, unspecified: Secondary | ICD-10-CM

## 2014-02-14 DIAGNOSIS — E559 Vitamin D deficiency, unspecified: Secondary | ICD-10-CM | POA: Diagnosis not present

## 2014-02-14 DIAGNOSIS — I10 Essential (primary) hypertension: Secondary | ICD-10-CM | POA: Diagnosis not present

## 2014-02-14 DIAGNOSIS — K7689 Other specified diseases of liver: Secondary | ICD-10-CM

## 2014-02-14 DIAGNOSIS — L301 Dyshidrosis [pompholyx]: Secondary | ICD-10-CM

## 2014-02-14 DIAGNOSIS — J309 Allergic rhinitis, unspecified: Secondary | ICD-10-CM

## 2014-02-14 DIAGNOSIS — F909 Attention-deficit hyperactivity disorder, unspecified type: Secondary | ICD-10-CM

## 2014-02-14 DIAGNOSIS — J4530 Mild persistent asthma, uncomplicated: Secondary | ICD-10-CM

## 2014-02-14 LAB — CBC WITH DIFFERENTIAL/PLATELET
BASOS PCT: 1 % (ref 0–1)
Basophils Absolute: 0.1 10*3/uL (ref 0.0–0.1)
EOS PCT: 3 % (ref 0–5)
Eosinophils Absolute: 0.3 10*3/uL (ref 0.0–0.7)
HCT: 43.2 % (ref 36.0–46.0)
Hemoglobin: 14.9 g/dL (ref 12.0–15.0)
LYMPHS PCT: 23 % (ref 12–46)
Lymphs Abs: 2.1 10*3/uL (ref 0.7–4.0)
MCH: 30.8 pg (ref 26.0–34.0)
MCHC: 34.5 g/dL (ref 30.0–36.0)
MCV: 89.3 fL (ref 78.0–100.0)
Monocytes Absolute: 0.5 10*3/uL (ref 0.1–1.0)
Monocytes Relative: 6 % (ref 3–12)
Neutro Abs: 6 10*3/uL (ref 1.7–7.7)
Neutrophils Relative %: 67 % (ref 43–77)
PLATELETS: 276 10*3/uL (ref 150–400)
RBC: 4.84 MIL/uL (ref 3.87–5.11)
RDW: 12.9 % (ref 11.5–15.5)
WBC: 9 10*3/uL (ref 4.0–10.5)

## 2014-02-14 MED ORDER — AMPHETAMINE-DEXTROAMPHETAMINE 5 MG PO TABS: 5.0000 mg | ORAL_TABLET | Freq: Every day | ORAL | Status: DC

## 2014-02-14 MED ORDER — LORAZEPAM 0.5 MG PO TABS: 0.5000 mg | ORAL_TABLET | Freq: Two times a day (BID) | ORAL | Status: DC | PRN

## 2014-02-14 MED ORDER — AMPHETAMINE-DEXTROAMPHET ER 25 MG PO CP24: 25.0000 mg | ORAL_CAPSULE | ORAL | Status: DC

## 2014-02-14 NOTE — Progress Notes (Signed)
Subjective:  This chart was scribed for [name], MD by Thea Alken, ED Scribe. This patient was seen in room 23 and the patient's care was started at 1:45 PM.  Patient ID: [name], female    DOB: 1966-01-16, 48 y.o.   MRN: [id number]  HPI  Chief Complaint  Patient presents with  . Medication Refill  . ADHD  . Asthma  . Anxiety  . Hypertension   HPI Comments:  [name] is a 48 y.o. female who presents to the [facility] for a medication refill. Pt is nursing school. Last office visit 07/13/2013.   1) Allergic Rhinitis/Hives:  Pt recently switched from Claritin to zyrtec. She reports she was breaking into hives, stressed induced daily. She reports changed detergent with relief to hive as well as the zyrtec.  2) Pt reports she had pneumonia in feb. Pt reports she was out of school for 3 weeks due to illness. She reports during that times she was switched by a pulmonologist from symbicort to Flovent due to low oxygen levels. He received a D-dimer which was normal/negative.  They did have find pulmonary nodules 3-54mm in which she f/u with a pulmonologist. Pt reports pulmonologist was not concerned.   She reports pulmonologist ordered pt a sleep study. Pt reports she snores. Pt denies chest tightness and wheezing.  S/p sleep study in the past that was negative. Willing to repeat sleep study; will send results to office.  3) Pt is concerned about vitamin D deficiency. Pt states that the last time her vitamin D was checked within the past six months it was a 4.9. Pt states she was taking vitamin D 3 50,000 units for 8 week than switched to Vitamin D 2000 units.  Pt believes deficiency  is causing fatigue.  Fatigue has recurred since switching to a daily Vitamin D supplement.    4) Pt c/o scaling to finger. She reports when scaling/bubbling starts to break open, she has sore/raw pain to finger. She was seen by a dermatologist and diagnosed with dishydrotic  eczema within the past two years.  5) Pt is requesting increase of adderall to 30 mg due to going back to school. Pt is currently going through a hard time with school. She may possibly lose her scholarship and this is causing pt major stress. Pt takes HCTZ for blood pressure and ativan for anxiety daily.  Admits to anxiety recently due to questions by professor/counselor in graduate school regarding post-concussive syndrome that patient suffered the summer prior to starting graduate school.  Pt is very happy that she started Fluoxetine one year ago with the start of graduate school.  She has really been questioned regarding post-concussive syndrome and her readiness to start NP program in fall of 2014.  Pt has performed very well with excellent grades. She denies difficulties with concentration or memory.  She has no personal concerns.  Neurology also referred pt for extensive neuropsychiatric evaluation prior to starting NP program.  Having to take Lorazepam daily.  6) Pt reports tube came out of  right ear. She has had pain to ear. Pt is hoping to f/u with ENT. Pt is considering permanent tubes.  7) Pt c/o a new scaling area on left jaw margin. She reports constant itching  Pt denies bleeding to area.  8) Pt c/o intermittent right abdominal pain. She reports pain is brought on by food at time. Pt has h/o cholecystomy and report similar pain.  This is similar pain that pt was having in 07/2013 visit.  No n/v/d/c; no bloody stools or black stools; no urinary symptoms; no hematuria; no flank pain.  S/p imaging study in ED with onset of symptoms that was negative.  Past Medical History  Diagnosis Date  . Hypertension   . GERD (gastroesophageal reflux disease)   . Fluid retention   . DVT (deep venous thrombosis) 2002    left leg  . Celiac sprue     biopsy of small intenstin  . Attention deficit disorder with hyperactivity(314.01)   . IBS (irritable bowel syndrome)   . Benign neoplasm of skin, site  unspecified   . Allergic rhinitis, cause unspecified   . Optic neuritis, unspecified     both eye  . Eating disorder, unspecified   . Calculus of kidney   . Tachycardia, unspecified   . Benign neoplasm of colon   . Apnea   . Other chronic allergic conjunctivitis   . Multiple sclerosis   . Tremor   . Symptomatic states associated with artificial menopause   . Anxiety state, unspecified   . Genital herpes, unspecified   . Rosacea   . Unspecified asthma(493.90)   . Heart murmur    Past Surgical History  Procedure Laterality Date  . Abdominal hysterectomy  2008  . Cholecystectomy  2002  . Appendectomy  2005  . Cardiac catheterization  09/2009  . Tonsilectomy, adenoidectomy, bilateral myringotomy and tubes  1987  . Rotator cuff repair  1996    Right  . Nevus resection  06/2010    R upper back; L anterior chest 08/2011.   Prior to Admission medications   Medication Sig Start Date End Date Taking? Authorizing Provider  albuterol (PROVENTIL HFA;VENTOLIN HFA) 108 (90 BASE) MCG/ACT inhaler Inhale 2 puffs into the lungs every 6 (six) hours as needed for wheezing or shortness of breath. 07/13/13  Yes [name], MD  amphetamine-dextroamphetamine (ADDERALL XR) 20 MG 24 hr capsule Take 1 capsule (20 mg total) by mouth every morning. 07/13/13  Yes [name], MD  cetirizine (ZYRTEC) 10 MG tablet Take 10 mg by mouth daily.   Yes Historical Provider, MD  desonide (DESOWEN) 0.05 % lotion Apply topically 2 (two) times daily. 10/20/12  Yes [name], MD  dexlansoprazole (DEXILANT) 60 MG capsule Take 1 capsule (60 mg total) by mouth 2 (two) times daily. 01/25/13  Yes [name], MD  EPINEPHrine (EPIPEN) 0.3 mg/0.3 mL DEVI Inject 0.3 mLs (0.3 mg total) into the muscle once. 03/01/12  Yes [name], MD  FLUoxetine (PROZAC) 20 MG tablet Take 1 tablet (20 mg total) by mouth daily. 07/13/13  Yes [name], MD  fluticasone (FLOVENT HFA) 110 MCG/ACT inhaler Inhale into the lungs 2 (two)  times daily.   Yes Historical Provider, MD  hydrochlorothiazide (HYDRODIURIL) 25 MG tablet Take 1 tablet (25 mg total) by mouth daily. 07/13/13  Yes [name], MD  LORazepam (ATIVAN) 0.5 MG tablet Take 1 tablet (0.5 mg total) by mouth 2 (two) times daily as needed for anxiety. 06/23/13  Yes [name], MD  nitroGLYCERIN (NITROSTAT) 0.4 MG SL tablet Place 1 tablet (0.4 mg total) under the tongue every 5 (five) minutes as needed for chest pain. NEED REFILLS 02/22/13 03/14/14 Yes [name], MD  triamcinolone ointment (KENALOG) 0.1 % Apply topically 2 (two) times daily. Apply to hands 10/20/12  Yes [name], MD  albuterol (PROVENTIL) (2.5 MG/3ML) 0.083% nebulizer  solution Take 3 mLs (2.5 mg total) by nebulization every 6 (six) hours as needed for wheezing. 03/01/12 06/23/13  [name], MD  budesonide-formoterol (SYMBICORT) 80-4.5 MCG/ACT inhaler Inhale 2 puffs into the lungs 2 (two) times daily.      Historical Provider, MD  loratadine (CLARITIN) 10 MG tablet Take 10 mg by mouth daily.    Historical Provider, MD  naproxen (NAPROSYN) 500 MG tablet Take 1 tablet (500 mg total) by mouth 2 (two) times daily with a meal. 10/29/12   Lisette Abu, PA-C  valACYclovir (VALTREX) 500 MG tablet Take 1 tablet (500 mg total) by mouth 2 (two) times daily. Pt states she hasnt taken in 1.5 years 03/01/12   [name], MD   Review of Systems  Constitutional: Positive for fatigue. Negative for fever, chills and diaphoresis.  HENT: Positive for ear pain. Negative for congestion, postnasal drip, rhinorrhea, sinus pressure and sore throat.   Respiratory: Negative for cough, chest tightness, shortness of breath and wheezing.   Cardiovascular: Negative for chest pain, palpitations and leg swelling.  Gastrointestinal: Positive for abdominal pain. Negative for nausea, vomiting, diarrhea, constipation, blood in stool, abdominal distention, anal bleeding and rectal pain.  Skin: Positive for rash.    Neurological: Negative for dizziness, tremors, seizures, syncope, facial asymmetry, speech difficulty, weakness, light-headedness, numbness and headaches.  Psychiatric/Behavioral: Positive for sleep disturbance and decreased concentration. Negative for suicidal ideas, self-injury and dysphoric mood. The patient is nervous/anxious.    Objective:   Physical Exam  Nursing note and vitals reviewed. Constitutional: She is oriented to person, place, and time. She appears well-developed and well-nourished. No distress.  HENT:  Head: Normocephalic and atraumatic.  Right Ear: Hearing, tympanic membrane, external ear and ear canal normal.  Left Ear: Hearing, tympanic membrane, external ear and ear canal normal.  Eyes: Conjunctivae and EOM are normal.  Neck: Neck supple.  Cardiovascular: Normal rate, regular rhythm and normal heart sounds.  Exam reveals no gallop and no friction rub.   No murmur heard. Pulmonary/Chest: Effort normal and breath sounds normal. No respiratory distress. She has no wheezes. She has no rales. She exhibits no tenderness.  Abdominal: Soft. There is no hepatosplenomegaly, splenomegaly or hepatomegaly. There is tenderness in the right upper quadrant.  Musculoskeletal: Normal range of motion.  Neurological: She is alert and oriented to person, place, and time.  Skin: Skin is warm and dry.  Psychiatric: She has a normal mood and affect. Her behavior is normal.    Filed Vitals:   02/14/14 1323  BP: 130/80  Pulse: 77  Temp: 98.5 F (36.9 C)  Resp: 16    Assessment & Plan:   1. Attention deficit   2. HYPERTENSION   3. Unspecified vitamin D deficiency   4. Hyperglycemia   5. Hepatic steatosis   6. Gastroesophageal reflux disease without esophagitis   7. Asthma, mild persistent, uncomplicated   8. ADHD   9. Adjustment disorder with mixed anxiety and depressed mood   10. ALLERGIC RHINITIS   11. Shellfish allergy   12. Essential hypertension   13. Genital HSV    14. Eczema, dyshidrotic    1. ADHD: worsening due to increasing stressors and increased shifts during NP school. Agreeable to adding Adderall 5mg  every afternoon to Adderall XR 25 mg daily; refills x 3 provided; call in 3 months when needs refill. Recommend follow-up in 3-4 months when on holiday. 2.  Anxiety and depression: worsening due to possibility of losing scholarship and with questions regarding  post-concussive syndrome the summer before starting NP program.  Continue Fluoxetine 20mg  daily; continue Lorazepam daily. Refills provided. Counseling provided; highly recommend psychotherapy during school. 3.  HTN; controlled; refill provided of HCTZ. 4.  Vitamin D deficiency: New.  S/p eight weeks of weekly supplementation; now switched to Vitamin D 2000 IU daily. 5.  Hyperglycemia: stable; obtain HgbA1c; recent weight gain during NP school. 6.  GERD: moderately controlled; no changes to therapy. 7.  Fatty liver: stable; recommend weight loss, low fat foods, exercise. 8.  Asthma: stable; recent switch from Symbicort to Flovent; refill of Flovent provided. S/p pulmonology consultation with recent pneumonia and pulmonary nodules detected.  No further work up warranted. 9.  Allergic Rhinitis and Hives: New.  Switched from Claritin to Zyrtec; refill provided. 10.  Shellfish allergy: stable; refill of epipen provided. 11.  Genital HSV: stable; no recent outbreaks; refill of Valtrex provided. 12. Dishydrotic eczema: recurrent; continue topical steroid cream bid PRN.  Meds ordered this encounter  Medications  . DISCONTD: fluticasone (FLOVENT HFA) 110 MCG/ACT inhaler    Sig: Inhale into the lungs 2 (two) times daily.  Marland Kitchen DISCONTD: cetirizine (ZYRTEC) 10 MG tablet    Sig: Take 10 mg by mouth daily.  Marland Kitchen DISCONTD: amphetamine-dextroamphetamine (ADDERALL XR) 25 MG 24 hr capsule    Sig: Take 1 capsule (25 mg total) by mouth every morning.    Dispense:  30 capsule    Refill:  0  . DISCONTD:  amphetamine-dextroamphetamine (ADDERALL XR) 25 MG 24 hr capsule    Sig: Take 1 capsule (25 mg total) by mouth every morning.    Dispense:  30 capsule    Refill:  0    DO NOT FILL UNTIL 03-17-14.  Marland Kitchen amphetamine-dextroamphetamine (ADDERALL XR) 25 MG 24 hr capsule    Sig: Take 1 capsule (25 mg total) by mouth every morning.    Dispense:  30 capsule    Refill:  0    DO NOT FILL UNTIL 04-16-14.  Marland Kitchen DISCONTD: amphetamine-dextroamphetamine (ADDERALL) 5 MG tablet    Sig: Take 1 tablet (5 mg total) by mouth daily.    Dispense:  30 tablet    Refill:  0  . DISCONTD: amphetamine-dextroamphetamine (ADDERALL) 5 MG tablet    Sig: Take 1 tablet (5 mg total) by mouth daily.    Dispense:  30 tablet    Refill:  0    DO NOT FILL UNTIL 03-17-14  . amphetamine-dextroamphetamine (ADDERALL) 5 MG tablet    Sig: Take 1 tablet (5 mg total) by mouth daily.    Dispense:  30 tablet    Refill:  0    DO NOT FILL UNTIL 04-16-14  . LORazepam (ATIVAN) 0.5 MG tablet    Sig: Take 1 tablet (0.5 mg total) by mouth 2 (two) times daily as needed for anxiety.    Dispense:  30 tablet    Refill:  2  . albuterol (PROVENTIL HFA;VENTOLIN HFA) 108 (90 BASE) MCG/ACT inhaler    Sig: Inhale 2 puffs into the lungs every 6 (six) hours as needed for wheezing or shortness of breath.    Dispense:  18 g    Refill:  2    PRO-AIR  . dexlansoprazole (DEXILANT) 60 MG capsule    Sig: Take 1 capsule (60 mg total) by mouth 2 (two) times daily.    Dispense:  180 capsule    Refill:  3  . FLUoxetine (PROZAC) 20 MG tablet    Sig: Take 1 tablet (20 mg total) by  mouth daily.    Dispense:  90 tablet    Refill:  1  . hydrochlorothiazide (HYDRODIURIL) 25 MG tablet    Sig: Take 1 tablet (25 mg total) by mouth daily.    Dispense:  90 tablet    Refill:  1  . valACYclovir (VALTREX) 500 MG tablet    Sig: Take 1 tablet (500 mg total) by mouth 2 (two) times daily.    Dispense:  180 tablet    Refill:  1  . fluticasone (FLOVENT HFA) 110 MCG/ACT  inhaler    Sig: Inhale 2 puffs into the lungs 2 (two) times daily.    Dispense:  1 Inhaler    Refill:  12  . EPINEPHrine (EPIPEN 2-PAK) 0.3 mg/0.3 mL IJ SOAJ injection    Sig: Inject 0.3 mLs (0.3 mg total) into the muscle once.    Dispense:  1 Device    Refill:  4  . cetirizine (ZYRTEC) 10 MG tablet    Sig: Take 1 tablet (10 mg total) by mouth daily.    Dispense:  90 tablet    Refill:  1    I personally performed the services described in this documentation, which was scribed in my presence.  The recorded information has been reviewed and is accurate.  Reginia Forts, M.D.  [facility] [address] [place], [place]  [address] [phone] phone [phone] fax

## 2014-02-15 LAB — COMPREHENSIVE METABOLIC PANEL
ALK PHOS: 91 U/L (ref 39–117)
ALT: 40 U/L — ABNORMAL HIGH (ref 0–35)
AST: 26 U/L (ref 0–37)
Albumin: 4.3 g/dL (ref 3.5–5.2)
BILIRUBIN TOTAL: 0.7 mg/dL (ref 0.2–1.2)
BUN: 13 mg/dL (ref 6–23)
CALCIUM: 9.2 mg/dL (ref 8.4–10.5)
CHLORIDE: 106 meq/L (ref 96–112)
CO2: 26 mEq/L (ref 19–32)
CREATININE: 0.77 mg/dL (ref 0.50–1.10)
Glucose, Bld: 96 mg/dL (ref 70–99)
Potassium: 4.2 mEq/L (ref 3.5–5.3)
Sodium: 139 mEq/L (ref 135–145)
Total Protein: 6.4 g/dL (ref 6.0–8.3)

## 2014-02-15 LAB — VITAMIN D 25 HYDROXY (VIT D DEFICIENCY, FRACTURES): Vit D, 25-Hydroxy: 22 ng/mL — ABNORMAL LOW (ref 30–89)

## 2014-02-15 MED ORDER — ALBUTEROL SULFATE HFA 108 (90 BASE) MCG/ACT IN AERS: 2.0000 | INHALATION_SPRAY | Freq: Four times a day (QID) | RESPIRATORY_TRACT | Status: DC | PRN

## 2014-02-15 MED ORDER — FLUTICASONE PROPIONATE HFA 110 MCG/ACT IN AERO: 2.0000 | INHALATION_SPRAY | Freq: Two times a day (BID) | RESPIRATORY_TRACT | Status: DC

## 2014-02-15 MED ORDER — HYDROCHLOROTHIAZIDE 25 MG PO TABS: 25.0000 mg | ORAL_TABLET | Freq: Every day | ORAL | Status: DC

## 2014-02-15 MED ORDER — FLUOXETINE HCL 20 MG PO TABS: 20.0000 mg | ORAL_TABLET | Freq: Every day | ORAL | Status: DC

## 2014-02-15 MED ORDER — DEXLANSOPRAZOLE 60 MG PO CPDR: 60.0000 mg | DELAYED_RELEASE_CAPSULE | Freq: Two times a day (BID) | ORAL | Status: DC

## 2014-02-15 MED ORDER — VALACYCLOVIR HCL 500 MG PO TABS: 500.0000 mg | ORAL_TABLET | Freq: Two times a day (BID) | ORAL | Status: DC

## 2014-02-15 MED ORDER — CETIRIZINE HCL 10 MG PO TABS: 10.0000 mg | ORAL_TABLET | Freq: Every day | ORAL | Status: DC

## 2014-02-15 MED ORDER — EPINEPHRINE 0.3 MG/0.3ML IJ SOAJ: 0.3000 mg | Freq: Once | INTRAMUSCULAR | Status: DC

## 2014-02-19 ENCOUNTER — Ambulatory Visit (INDEPENDENT_AMBULATORY_CARE_PROVIDER_SITE_OTHER): Payer: Medicare Other | Admitting: Family Medicine

## 2014-02-19 VITALS — BP 132/88 | HR 83 | Temp 98.4°F | Resp 18 | Ht 61.0 in | Wt 199.0 lb

## 2014-02-19 DIAGNOSIS — S61409A Unspecified open wound of unspecified hand, initial encounter: Secondary | ICD-10-CM

## 2014-02-19 DIAGNOSIS — F4323 Adjustment disorder with mixed anxiety and depressed mood: Secondary | ICD-10-CM | POA: Diagnosis not present

## 2014-02-19 DIAGNOSIS — F4310 Post-traumatic stress disorder, unspecified: Secondary | ICD-10-CM

## 2014-02-19 DIAGNOSIS — F0781 Postconcussional syndrome: Secondary | ICD-10-CM

## 2014-02-19 DIAGNOSIS — M79609 Pain in unspecified limb: Secondary | ICD-10-CM

## 2014-02-19 DIAGNOSIS — M79642 Pain in left hand: Secondary | ICD-10-CM

## 2014-02-19 DIAGNOSIS — W540XXA Bitten by dog, initial encounter: Secondary | ICD-10-CM

## 2014-02-19 DIAGNOSIS — S61452A Open bite of left hand, initial encounter: Secondary | ICD-10-CM

## 2014-02-19 NOTE — Patient Instructions (Signed)
1. Return for development of fever, swelling, redness of hand.
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
## 2014-03-05 DIAGNOSIS — L301 Dyshidrosis [pompholyx]: Secondary | ICD-10-CM | POA: Insufficient documentation

## 2014-03-05 DIAGNOSIS — Z91013 Allergy to seafood: Secondary | ICD-10-CM | POA: Insufficient documentation

## 2014-03-05 DIAGNOSIS — A6000 Herpesviral infection of urogenital system, unspecified: Secondary | ICD-10-CM | POA: Insufficient documentation

## 2014-05-23 ENCOUNTER — Ambulatory Visit (INDEPENDENT_AMBULATORY_CARE_PROVIDER_SITE_OTHER): Payer: Medicare Other | Admitting: Family Medicine

## 2014-05-23 ENCOUNTER — Encounter: Payer: Self-pay | Admitting: Family Medicine

## 2014-05-23 ENCOUNTER — Ambulatory Visit (INDEPENDENT_AMBULATORY_CARE_PROVIDER_SITE_OTHER): Payer: Medicare Other

## 2014-05-23 VITALS — BP 140/80 | HR 86 | Temp 97.9°F | Resp 16 | Ht 60.75 in | Wt 205.6 lb

## 2014-05-23 DIAGNOSIS — G4733 Obstructive sleep apnea (adult) (pediatric): Secondary | ICD-10-CM | POA: Diagnosis not present

## 2014-05-23 DIAGNOSIS — N760 Acute vaginitis: Secondary | ICD-10-CM

## 2014-05-23 DIAGNOSIS — R0789 Other chest pain: Secondary | ICD-10-CM | POA: Diagnosis not present

## 2014-05-23 DIAGNOSIS — J4530 Mild persistent asthma, uncomplicated: Secondary | ICD-10-CM

## 2014-05-23 DIAGNOSIS — R232 Flushing: Secondary | ICD-10-CM | POA: Diagnosis not present

## 2014-05-23 DIAGNOSIS — F411 Generalized anxiety disorder: Secondary | ICD-10-CM | POA: Diagnosis not present

## 2014-05-23 DIAGNOSIS — Z23 Encounter for immunization: Secondary | ICD-10-CM

## 2014-05-23 LAB — CBC WITH DIFFERENTIAL/PLATELET
Basophils Absolute: 0 10*3/uL (ref 0.0–0.1)
Basophils Relative: 0 % (ref 0–1)
Eosinophils Absolute: 0.4 10*3/uL (ref 0.0–0.7)
Eosinophils Relative: 4 % (ref 0–5)
HCT: 44 % (ref 36.0–46.0)
Hemoglobin: 15.4 g/dL — ABNORMAL HIGH (ref 12.0–15.0)
LYMPHS PCT: 29 % (ref 12–46)
Lymphs Abs: 2.7 10*3/uL (ref 0.7–4.0)
MCH: 31.2 pg (ref 26.0–34.0)
MCHC: 35 g/dL (ref 30.0–36.0)
MCV: 89.2 fL (ref 78.0–100.0)
MONO ABS: 0.6 10*3/uL (ref 0.1–1.0)
MPV: 10.4 fL (ref 9.4–12.4)
Monocytes Relative: 7 % (ref 3–12)
NEUTROS ABS: 5.5 10*3/uL (ref 1.7–7.7)
Neutrophils Relative %: 60 % (ref 43–77)
Platelets: 316 10*3/uL (ref 150–400)
RBC: 4.93 MIL/uL (ref 3.87–5.11)
RDW: 13 % (ref 11.5–15.5)
WBC: 9.2 10*3/uL (ref 4.0–10.5)

## 2014-05-23 LAB — COMPREHENSIVE METABOLIC PANEL
ALBUMIN: 4.4 g/dL (ref 3.5–5.2)
ALT: 41 U/L — ABNORMAL HIGH (ref 0–35)
AST: 26 U/L (ref 0–37)
Alkaline Phosphatase: 102 U/L (ref 39–117)
BUN: 10 mg/dL (ref 6–23)
CALCIUM: 9.9 mg/dL (ref 8.4–10.5)
CHLORIDE: 103 meq/L (ref 96–112)
CO2: 28 mEq/L (ref 19–32)
Creat: 0.84 mg/dL (ref 0.50–1.10)
Glucose, Bld: 122 mg/dL — ABNORMAL HIGH (ref 70–99)
POTASSIUM: 4.2 meq/L (ref 3.5–5.3)
Sodium: 140 mEq/L (ref 135–145)
Total Bilirubin: 0.5 mg/dL (ref 0.2–1.2)
Total Protein: 7.4 g/dL (ref 6.0–8.3)

## 2014-05-23 MED ORDER — FLUOXETINE HCL 20 MG PO TABS: 40.0000 mg | ORAL_TABLET | Freq: Every day | ORAL | Status: DC

## 2014-05-23 MED ORDER — METOPROLOL SUCCINATE ER 25 MG PO TB24: 25.0000 mg | ORAL_TABLET | Freq: Every day | ORAL | Status: DC

## 2014-05-23 MED ORDER — NYSTATIN 100000 UNIT/GM EX OINT: 1.0000 "application " | TOPICAL_OINTMENT | Freq: Two times a day (BID) | CUTANEOUS | Status: DC

## 2014-05-23 MED ORDER — ALBUTEROL SULFATE HFA 108 (90 BASE) MCG/ACT IN AERS: 2.0000 | INHALATION_SPRAY | Freq: Four times a day (QID) | RESPIRATORY_TRACT | Status: DC | PRN

## 2014-05-23 MED ORDER — FLUCONAZOLE 150 MG PO TABS: 150.0000 mg | ORAL_TABLET | Freq: Once | ORAL | Status: DC
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
## 2014-05-24 ENCOUNTER — Encounter: Payer: Self-pay | Admitting: Family Medicine

## 2014-06-04 ENCOUNTER — Encounter: Payer: Self-pay | Admitting: Family Medicine

## 2014-06-04 DIAGNOSIS — J45909 Unspecified asthma, uncomplicated: Secondary | ICD-10-CM | POA: Insufficient documentation

## 2014-06-29 ENCOUNTER — Other Ambulatory Visit: Payer: Self-pay

## 2014-06-29 MED ORDER — FLUOXETINE HCL 20 MG PO TABS: 40.0000 mg | ORAL_TABLET | Freq: Every day | ORAL | Status: DC

## 2014-06-29 MED ORDER — METOPROLOL SUCCINATE ER 25 MG PO TB24: 25.0000 mg | ORAL_TABLET | Freq: Every day | ORAL | Status: DC

## 2014-07-09 ENCOUNTER — Ambulatory Visit (INDEPENDENT_AMBULATORY_CARE_PROVIDER_SITE_OTHER): Payer: Medicare Other | Admitting: Family Medicine

## 2014-07-09 ENCOUNTER — Ambulatory Visit (INDEPENDENT_AMBULATORY_CARE_PROVIDER_SITE_OTHER): Payer: Medicare Other

## 2014-07-09 VITALS — BP 118/78 | HR 92 | Temp 98.1°F | Resp 18 | Ht 62.0 in | Wt 204.0 lb

## 2014-07-09 DIAGNOSIS — R05 Cough: Secondary | ICD-10-CM

## 2014-07-09 DIAGNOSIS — R059 Cough, unspecified: Secondary | ICD-10-CM

## 2014-07-09 DIAGNOSIS — J0100 Acute maxillary sinusitis, unspecified: Secondary | ICD-10-CM

## 2014-07-09 DIAGNOSIS — J029 Acute pharyngitis, unspecified: Secondary | ICD-10-CM

## 2014-07-09 DIAGNOSIS — J4531 Mild persistent asthma with (acute) exacerbation: Secondary | ICD-10-CM | POA: Diagnosis not present

## 2014-07-09 LAB — POCT CBC
GRANULOCYTE PERCENT: 80.2 % — AB (ref 37–80)
HEMATOCRIT: 45 % (ref 37.7–47.9)
Hemoglobin: 14.9 g/dL (ref 12.2–16.2)
Lymph, poc: 1.8 (ref 0.6–3.4)
MCH, POC: 30.6 pg (ref 27–31.2)
MCHC: 33 g/dL (ref 31.8–35.4)
MCV: 92.5 fL (ref 80–97)
MID (cbc): 0.4 (ref 0–0.9)
MPV: 7.8 fL (ref 0–99.8)
POC GRANULOCYTE: 8.8 — AB (ref 2–6.9)
POC LYMPH PERCENT: 16 %L (ref 10–50)
POC MID %: 3.8 %M (ref 0–12)
Platelet Count, POC: 286 10*3/uL (ref 142–424)
RBC: 4.87 M/uL (ref 4.04–5.48)
RDW, POC: 12.9 %
WBC: 11 10*3/uL — AB (ref 4.6–10.2)

## 2014-07-09 LAB — POCT RAPID STREP A (OFFICE): RAPID STREP A SCREEN: NEGATIVE

## 2014-07-09 MED ORDER — PREDNISONE 20 MG PO TABS: ORAL_TABLET | ORAL | Status: DC

## 2014-07-09 MED ORDER — AMOXICILLIN-POT CLAVULANATE 875-125 MG PO TABS: 1.0000 | ORAL_TABLET | Freq: Two times a day (BID) | ORAL | Status: DC

## 2014-07-09 MED ORDER — HYDROCOD POLST-CHLORPHEN POLST 10-8 MG/5ML PO LQCR
5.0000 mL | Freq: Two times a day (BID) | ORAL | Status: DC | PRN
Start: 2014-07-09 — End: 2014-07-21

## 2014-07-09 NOTE — Patient Instructions (Signed)
1.  MUCINEX DM ONE TABLET TWICE DAILY. 2.  RECOMMEND GENERIC AFRIN TWICE DAILY FOR 5-7 DAYS.
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
## 2014-07-11 LAB — CULTURE, GROUP A STREP: ORGANISM ID, BACTERIA: NORMAL

## 2014-07-12 ENCOUNTER — Encounter: Payer: Self-pay | Admitting: Physician Assistant

## 2014-07-12 ENCOUNTER — Ambulatory Visit (INDEPENDENT_AMBULATORY_CARE_PROVIDER_SITE_OTHER): Payer: Medicare Other | Admitting: Physician Assistant

## 2014-07-12 VITALS — BP 150/92 | HR 69 | Ht 61.0 in | Wt 205.1 lb

## 2014-07-12 DIAGNOSIS — R079 Chest pain, unspecified: Secondary | ICD-10-CM | POA: Diagnosis not present

## 2014-07-12 DIAGNOSIS — R739 Hyperglycemia, unspecified: Secondary | ICD-10-CM | POA: Diagnosis not present

## 2014-07-12 DIAGNOSIS — R002 Palpitations: Secondary | ICD-10-CM | POA: Diagnosis not present

## 2014-07-12 DIAGNOSIS — R0789 Other chest pain: Secondary | ICD-10-CM | POA: Diagnosis not present

## 2014-07-12 DIAGNOSIS — I10 Essential (primary) hypertension: Secondary | ICD-10-CM | POA: Diagnosis not present

## 2014-07-12 NOTE — Patient Instructions (Addendum)
Your physician recommends that you continue on your current medications as directed. Please refer to the Current Medication list given to you today. Your physician has requested that you have en exercise stress myoview. For further information please visit [email or web address]. Please follow instruction sheet, as given.   Your physician recommends that you schedule a follow-up appointment WITH DR Morehead.

## 2014-07-12 NOTE — Assessment & Plan Note (Signed)
Patient had 2 episodes of chest pain in November that were responsive to nitroglycerin. 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 Her last workup for chest pain was in 2012. We'll schedule stress Myoview for next week prior to her moving. Follow-up with Dr. Aundra Dubin on her return. Continue metoprolol.

## 2014-07-12 NOTE — Assessment & Plan Note (Signed)
Palpitations controlled with Toprol-XL.

## 2014-07-12 NOTE — Assessment & Plan Note (Signed)
Patient's blood pressure is elevated today. It's probably due to steroids. Her blood pressure usually runs on the low side. She does keep a close eye on this. 2 g sodium diet.

## 2014-07-12 NOTE — Progress Notes (Signed)
HPI: This is a 49 year old female patient [name] who has history of chest pain and palpitations. She had a cardiac cath in 09/2009 showing no angiographic CAD. She's worn a monitor that showed episodes of sinus tachycardia but no other significant arrhythmias. She had a Myoview 05/2011 at another cardiology office that was unremarkable. EF greater than 90% no ischemia. She has CTA of the chest in 05/2011 no PE or dissection. She also has hypertension. Echo in 2011 EF 55-60% mild MR and AI. Tachycardia has been controlled with Toprol-XL. She last saw Dr. Marigene Ehlers in 2013. She had an episode of chest pain on 05/2014 that was responsive to nitroglycerin. This occurred when she was under an extreme amount of stress and she was seen by her primary care.  Patient recently suffered from the flu and maxillary sinusitis and was placed on Augmentin ,Tussionex, Mucinex DM and prednisone for asthma exacerbation 3 days ago. [place] are all was also recommended 4 times a day for one week. She was also referred for CPAP because of obstructive sleep apnea.  Patient is concerned because her chest tightness in November when into both shoulders and was associated with diaphoresis and resolved with 2 nitroglycerin she had another episode at that same time relieved with one nitroglycerin. 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 She would like a workup done prior to moving to [place]. She says this is different pain than her acid reflux pain. She does admit that it occurred when she was under an extreme amount of stress. She has had no further symptoms. She is not exercising like she used to. She does have a degree of dyspnea on exertion but feels like that is due to inactivity and her asthma.  Allergies  Allergen Reactions  . Codeine     REACTION: hallucinations  . Erythromycin     REACTION: seizures  . Levofloxacin     REACTION: hallucinations    . Oxycodone Hcl     REACTION: passed out  . Telithromycin Rash    Generic for Ketek     Current Outpatient Prescriptions  Medication Sig Dispense Refill  . albuterol (PROVENTIL HFA;VENTOLIN HFA) 108 (90 BASE) MCG/ACT inhaler Inhale 2 puffs into the lungs every 6 (six) hours as needed for wheezing or shortness of breath (cough, shortness of breath or wheezing.). 1 Inhaler 2  . albuterol (PROVENTIL) (2.5 MG/3ML) 0.083% nebulizer solution Take 3 mLs (2.5 mg total) by nebulization every 6 (six) hours as needed for wheezing. 75 mL 12  . amoxicillin-clavulanate (AUGMENTIN) 875-125 MG per tablet Take 1 tablet by mouth 2 (two) times daily. 20 tablet 0  . amphetamine-dextroamphetamine (ADDERALL XR) 25 MG 24 hr capsule Take 1 capsule (25 mg total) by mouth every morning. 30 capsule 0  . amphetamine-dextroamphetamine (ADDERALL) 5 MG tablet Take 1 tablet (5 mg total) by mouth daily. 30 tablet 0  . budesonide-formoterol (SYMBICORT) 80-4.5 MCG/ACT inhaler Inhale 2 puffs into the lungs 2 (two) times daily.      . cetirizine (ZYRTEC) 10 MG tablet Take 1 tablet (10 mg total) by mouth daily. 90 tablet 1  . chlorpheniramine-HYDROcodone (TUSSIONEX) 10-8 MG/5ML LQCR Take 5 mLs by mouth every 12 (twelve) hours as needed for cough. 120 mL 0  . desonide (DESOWEN) 0.05 % lotion Apply topically 2 (two) times daily. 59 mL 0  . dexlansoprazole (DEXILANT) 60 MG capsule Take 1 capsule (60  mg total) by mouth 2 (two) times daily. 180 capsule 3  . EPINEPHrine (EPIPEN 2-PAK) 0.3 mg/0.3 mL IJ SOAJ injection Inject 0.3 mLs (0.3 mg total) into the muscle once. 1 Device 4  . fluconazole (DIFLUCAN) 150 MG tablet Take 1 tablet (150 mg total) by mouth once. Repeat if needed (Patient not taking: Reported on 07/09/2014) 2 tablet 0  . FLUoxetine (PROZAC) 20 MG tablet Take 2 tablets (40 mg total) by mouth daily. 180 tablet 0  . fluticasone (FLOVENT HFA) 110 MCG/ACT inhaler Inhale 2 puffs into the lungs 2 (two) times daily. 1 Inhaler 12  .  hydrochlorothiazide (HYDRODIURIL) 25 MG tablet Take 1 tablet (25 mg total) by mouth daily. 90 tablet 1  . loratadine (CLARITIN) 10 MG tablet Take 10 mg by mouth daily.    Marland Kitchen LORazepam (ATIVAN) 0.5 MG tablet Take 1 tablet (0.5 mg total) by mouth 2 (two) times daily as needed for anxiety. 30 tablet 2  . metoprolol succinate (TOPROL-XL) 25 MG 24 hr tablet Take 1-2 tablets (25-50 mg total) by mouth daily. 180 tablet 0  . naproxen (NAPROSYN) 500 MG tablet Take 1 tablet (500 mg total) by mouth 2 (two) times daily with a meal. (Patient not taking: Reported on 07/09/2014) 60 tablet 0  . nitroGLYCERIN (NITROSTAT) 0.4 MG SL tablet Place 1 tablet (0.4 mg total) under the tongue every 5 (five) minutes as needed for chest pain. NEED REFILLS 30 tablet 0  . nystatin ointment (MYCOSTATIN) Apply 1 application topically 2 (two) times daily. 30 g 0  . predniSONE (DELTASONE) 20 MG tablet Two tablets daily x 5 days then one tablet daily x 5 days 15 tablet 0  . triamcinolone ointment (KENALOG) 0.1 % Apply topically 2 (two) times daily. Apply to hands 30 g 0  . valACYclovir (VALTREX) 500 MG tablet Take 1 tablet (500 mg total) by mouth 2 (two) times daily. 180 tablet 1   No current facility-administered medications for this visit.    Past Medical History  Diagnosis Date  . Hypertension   . GERD (gastroesophageal reflux disease)   . DVT (deep venous thrombosis) 2002    left leg  . Celiac sprue     biopsy of small intenstin  . Attention deficit disorder with hyperactivity(314.01)   . IBS (irritable bowel syndrome)   . Allergic rhinitis, cause unspecified   . Optic neuritis, unspecified     both eye  . Eating disorder, unspecified   . Calculus of kidney   . Tachycardia, unspecified   . Apnea   . Other chronic allergic conjunctivitis   . Multiple sclerosis   . Tremor   . Anxiety state, unspecified   . Genital herpes, unspecified   . Rosacea   . Unspecified asthma(493.90)   . Heart murmur   . Benign neoplasm  of colon     Past Surgical History  Procedure Laterality Date  . Abdominal hysterectomy  2008  . Cholecystectomy  2002  . Appendectomy  2005  . Cardiac catheterization  09/2009  . Tonsilectomy, adenoidectomy, bilateral myringotomy and tubes  1987  . Rotator cuff repair  1996    Right  . Nevus resection  06/2010    R upper back; L anterior chest 08/2011.    Family History  Problem Relation Age of Onset  . Cancer Neg Hx     gyn  . Alcohol abuse Mother   . Diabetes Mother   . Coronary artery disease Mother     Carotid Artery DIsease  .  Hypertension Mother   . Diabetes Father   . Hypertension Father   . Colon cancer    . Kidney Stones Son   . Celiac disease Son     History   Social History  . Marital Status: Divorced    Spouse Name: N/A    Number of Children: N/A  . Years of Education: college   Occupational History  . disabled     due to [place]  . full time student     Nursing   Social History Main Topics  . Smoking status: Never Smoker   . Smokeless tobacco: Never Used  . Alcohol Use: No  . Drug Use: No  . Sexual Activity: Not Currently    Birth Control/ Protection: None, Abstinence   Other Topics Concern  . Not on file   Social History Narrative   Divorced since 1998 after 1 year of marriage, first marriage. Caffeine use: carbonated beverage about 4 serving / day. Lives with best friend Collie Siad, son, best friend;s two sons in house.. Always uses seat belts. Exercise: Inactive. She graduated from [employer].    ROS: See history of present illness otherwise negative  BP 150/92 mmHg  Pulse 69  Ht 5\' 1"  (1.549 m)  Wt 205 lb 1.9 oz (93.042 kg)  BMI 38.78 kg/m2  PHYSICAL EXAM: Obese, in no acute distress. Neck: No JVD, HJR, Bruit, or thyroid enlargement  Lungs: Decreased breath sounds but No tachypnea, clear without wheezing, rales, or rhonchi  Cardiovascular: RRR, PMI not displaced, Normal S1 and S2, no murmurs, gallops, bruit, thrill, or  heave.  Abdomen: BS normal. Soft without organomegaly, masses, lesions or tenderness.  Extremities: without cyanosis, clubbing or edema. Good distal pulses bilateral  SKin: Warm, no lesions or rashes   Musculoskeletal: No deformities  Neuro: no focal signs   Wt Readings from Last 3 Encounters:  07/09/14 204 lb (92.534 kg)  05/23/14 205 lb 9.6 oz (93.26 kg)  02/19/14 199 lb (90.266 kg)    EKG: Normal sinus rhythm with nonspecific ST-T wave changes, no acute change

## 2014-07-13 ENCOUNTER — Telehealth: Payer: Self-pay

## 2014-07-13 DIAGNOSIS — G4733 Obstructive sleep apnea (adult) (pediatric): Secondary | ICD-10-CM

## 2014-07-13 NOTE — Telephone Encounter (Signed)
Dr. Tamala Julian  Patient is wanting to leave town by Saturday and she has not heard from anyone regarding her Cpap machine.  States you were to order it for her when she was here Saturday.    [phone]

## 2014-07-14 ENCOUNTER — Institutional Professional Consult (permissible substitution): Payer: PRIVATE HEALTH INSURANCE | Admitting: Internal Medicine

## 2014-07-14 NOTE — Telephone Encounter (Signed)
Pt is very concerned about this, and leaves town on Monday. Please advise

## 2014-07-14 NOTE — Telephone Encounter (Signed)
Please advise if pt was to be set up with a sleep study for CPAP evaluation

## 2014-07-15 ENCOUNTER — Ambulatory Visit (HOSPITAL_COMMUNITY): Payer: Medicare Other | Attending: Cardiovascular Disease | Admitting: Radiology

## 2014-07-15 DIAGNOSIS — R079 Chest pain, unspecified: Secondary | ICD-10-CM | POA: Diagnosis not present

## 2014-07-15 MED ORDER — TECHNETIUM TC 99M SESTAMIBI GENERIC - CARDIOLITE
30.0000 | Freq: Once | INTRAVENOUS | Status: AC | PRN
  Administered 2014-07-15: 30 via INTRAVENOUS

## 2014-07-15 MED ORDER — TECHNETIUM TC 99M SESTAMIBI GENERIC - CARDIOLITE
10.0000 | Freq: Once | INTRAVENOUS | Status: AC | PRN
  Administered 2014-07-15: 10 via INTRAVENOUS

## 2014-07-15 NOTE — Progress Notes (Signed)
[facility] [address] [place], [place] [address] [phone]    Cardiology Nuclear Med Study  [name] is a 49 y.o. female     MRN : [id number]     DOB: Nov 08, 1965  Procedure Date: 07/15/2014  Nuclear Med Background Indication for Stress Test:  Evaluation for Ischemia History:  MPI 2012 (normal), Cath 2011 (normal), Asthma, CVT, MS Cardiac Risk Factors: Hypertension  Symptoms:  Chest Pain and Palpitations   Nuclear Pre-Procedure Caffeine/Decaff Intake:  None> 12 hrs NPO After: 7:00pm   Lungs:  clear O2 Sat: 98% on room air. IV 0.9% NS with Angio Cath:  24g  IV Site: R Forearm, tolerated well IV Started by:  Irven Baltimore, RN  Chest Size (in):  42 Cup Size: DD  Height: 5\' 1"  (1.549 m)  Weight:  204 lb (92.534 kg)  BMI:  Body mass index is 38.57 kg/(m^2). Tech Comments:  Patient held Toprol x 24 hrs. Nebulizer treatment at home at 0600 today. Irven Baltimore, RN.    Nuclear Med Study 1 or 2 day study: 1 day  Stress Test Type:  Stress  Reading MD: N/A  Order Authorizing Provider:  Loralie Champagne, MD, and Ermalinda Barrios, [facility]  Resting Radionuclide: Technetium 52m Sestamibi  Resting Radionuclide Dose: 11.0 mCi   Stress Radionuclide:  Technetium 39m Sestamibi  Stress Radionuclide Dose: 33.0 mCi           Stress Protocol Rest HR: 82 Stress HR: 160  Rest BP: 136/85 Stress BP: 150/81  Exercise Time (min): 6:30 METS: 7.7   Predicted Max HR: 174 bpm % Max HR: 91.95 bpm Rate Pressure Product: 24000   Dose of Adenosine (mg):  n/a Dose of Lexiscan: 0.4 mg  Dose of Atropine (mg): n/a Dose of Dobutamine: n/a mcg/kg/min (at max HR)  Stress Test Technologist: Glade Lloyd, BS-ES  Nuclear Technologist:  Earl Many, CNMT     Rest Procedure:  Myocardial perfusion imaging was performed at rest 45 minutes following the intravenous administration of Technetium 63m Sestamibi. Rest ECG: NSR - Normal EKG  Stress Procedure:  The patient exercised on  the treadmill utilizing the Bruce Protocol for 6:30 minutes. The patient stopped due to fatigue and had a 3/10 chest burning that resolved in recovery.  Technetium 97m Sestamibi was injected at peak exercise and myocardial perfusion imaging was performed after a brief delay. Stress ECG: No significant change from baseline ECG  QPS Raw Data Images:  Normal; no motion artifact; normal heart/lung ratio. Stress Images:  Normal homogeneous uptake in all areas of the myocardium. Rest Images:  Normal homogeneous uptake in all areas of the myocardium. Subtraction (SDS):  Normal Transient Ischemic Dilatation (Normal <1.22):  0.99 Lung/Heart Ratio (Normal <0.45):  0.27  Quantitative Gated Spect Images QGS EDV:  83 ml QGS ESV:  25 ml  Impression Exercise Capacity:  Good exercise capacity. BP Response:  Normal blood pressure response. Clinical Symptoms:  3/10 chest burning ECG Impression:  No significant ST segment change suggestive of ischemia. Comparison with Prior Nuclear Study: No previous nuclear study performed  Overall Impression:  Normal stress nuclear study.  LV Ejection Fraction: 70%.  LV Wall Motion:  Normal Wall Motion   Pixie Casino, MD, [facility] Board Certified in Nuclear Cardiology Attending Cardiologist [place]

## 2014-07-16 ENCOUNTER — Encounter: Payer: Self-pay | Admitting: Family Medicine

## 2014-07-16 NOTE — Telephone Encounter (Signed)
Spoke with [employer]. They said to fax order, demographics and sleep study to them and they would contact pt. Faxed and pt notified that this has been done.

## 2014-07-16 NOTE — Telephone Encounter (Signed)
Dr. Tamala Julian, I had Alisha scan this into [facility] for you so that you can go ahead and review it since you won't be in the office. The hard copy is in your box. Please advise for pt and I will call her back. Thanks

## 2014-07-16 NOTE — Telephone Encounter (Signed)
Please order/arrange auto CPAP ranging from 5 to 20 cm H2O with best fitting mask for severe OSA.

## 2014-07-16 NOTE — Telephone Encounter (Signed)
Please call pt --- I cannot locate the copy of patient's sleep study in her chart.  I have been searching for the sleep study since her visit last week.  Does she have another copy of the sleep study? Please apologize for the error.  I cannot find the sleep study in media or procedures in EPIC.  I need another copy of the sleep study to know what pressure of CPAP that patient warrants.

## 2014-07-16 NOTE — Telephone Encounter (Signed)
Pt came in to 102 today 12:05pm with copy of sleep study report. Pt states she really needs this done ASAP so that she can get this taken care of before she leaves to go out of town. Advised pt that I would deliver it to clinical staff and someone would call her when complete. CB # [id number].  Form given to [employer].

## 2014-07-16 NOTE — Telephone Encounter (Signed)
Done

## 2014-07-18 ENCOUNTER — Other Ambulatory Visit: Payer: Self-pay | Admitting: Family Medicine

## 2014-07-18 ENCOUNTER — Ambulatory Visit (INDEPENDENT_AMBULATORY_CARE_PROVIDER_SITE_OTHER): Payer: Medicare Other | Admitting: Family Medicine

## 2014-07-18 ENCOUNTER — Telehealth: Payer: Self-pay

## 2014-07-18 VITALS — BP 130/86 | HR 72 | Temp 98.5°F | Resp 18 | Ht 60.5 in | Wt 207.6 lb

## 2014-07-18 DIAGNOSIS — B3731 Acute candidiasis of vulva and vagina: Secondary | ICD-10-CM

## 2014-07-18 DIAGNOSIS — G4733 Obstructive sleep apnea (adult) (pediatric): Secondary | ICD-10-CM | POA: Diagnosis not present

## 2014-07-18 DIAGNOSIS — B373 Candidiasis of vulva and vagina: Secondary | ICD-10-CM | POA: Diagnosis not present

## 2014-07-18 MED ORDER — FLUCONAZOLE 150 MG PO TABS: 150.0000 mg | ORAL_TABLET | Freq: Every day | ORAL | Status: DC

## 2014-07-18 NOTE — Telephone Encounter (Signed)
Pt states she was ordered a C-PAP and since she is going out of town this week, would like to know the company's name so she can get in touch with them Please call [phone]

## 2014-07-18 NOTE — Telephone Encounter (Signed)
Sent all of her info to [employer]. Called pt and gave her this info with their phone number.

## 2014-07-18 NOTE — Progress Notes (Signed)
Subjective: 49 year old lady who is a patient of Dr. Thompson Caul. The patient has a diagnosis with having obstructive sleep apnea and is supposed to be started on CPAP. Apparently there is a form that needs to be completed for this process to be finished.  Also the patient was recently placed on Augmentin and has now developed vaginal itching and discharge and would like to be on Diflucan which she has had to take in the past when she was on antibiotics.  Objective: History only. Chose not to do a pelvic exam today.  Assessment: Monilia vaginitis Obstructive sleep apnea  Plan: Complete the forms if possible

## 2014-07-21 ENCOUNTER — Ambulatory Visit (INDEPENDENT_AMBULATORY_CARE_PROVIDER_SITE_OTHER): Payer: Medicare Other | Admitting: Family Medicine

## 2014-07-21 VITALS — BP 110/70 | HR 90 | Temp 98.7°F | Resp 16 | Ht 61.0 in | Wt 205.0 lb

## 2014-07-21 DIAGNOSIS — R41840 Attention and concentration deficit: Secondary | ICD-10-CM

## 2014-07-21 DIAGNOSIS — M19041 Primary osteoarthritis, right hand: Secondary | ICD-10-CM

## 2014-07-21 DIAGNOSIS — R300 Dysuria: Secondary | ICD-10-CM | POA: Diagnosis not present

## 2014-07-21 DIAGNOSIS — I201 Angina pectoris with documented spasm: Secondary | ICD-10-CM | POA: Diagnosis not present

## 2014-07-21 LAB — POCT UA - MICROSCOPIC ONLY
CASTS, UR, LPF, POC: NEGATIVE
CRYSTALS, UR, HPF, POC: NEGATIVE
Mucus, UA: NEGATIVE
Yeast, UA: NEGATIVE

## 2014-07-21 LAB — POCT URINALYSIS DIPSTICK
Bilirubin, UA: NEGATIVE
Blood, UA: NEGATIVE
Glucose, UA: NEGATIVE
KETONES UA: NEGATIVE
Nitrite, UA: NEGATIVE
Protein, UA: NEGATIVE
Spec Grav, UA: 1.025
Urobilinogen, UA: 0.2
pH, UA: 5

## 2014-07-21 MED ORDER — AMPHETAMINE-DEXTROAMPHETAMINE 5 MG PO TABS: 5.0000 mg | ORAL_TABLET | Freq: Every day | ORAL | Status: DC

## 2014-07-21 MED ORDER — AMPHETAMINE-DEXTROAMPHET ER 25 MG PO CP24: 25.0000 mg | ORAL_CAPSULE | ORAL | Status: DC

## 2014-07-21 MED ORDER — NITROFURANTOIN MONOHYD MACRO 100 MG PO CAPS: 100.0000 mg | ORAL_CAPSULE | Freq: Two times a day (BID) | ORAL | Status: DC

## 2014-07-21 MED ORDER — MELOXICAM 7.5 MG PO TABS: 7.5000 mg | ORAL_TABLET | Freq: Every day | ORAL | Status: DC

## 2014-07-21 MED ORDER — NITROGLYCERIN 0.4 MG SL SUBL: 0.4000 mg | SUBLINGUAL_TABLET | SUBLINGUAL | Status: DC | PRN

## 2014-07-21 NOTE — Patient Instructions (Signed)
I will be in touch with your urine culture.  Use the macrobid twice a day for one week for UTI Use the adderall as ususal mobic as needed for your pain
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
## 2014-07-23 LAB — URINE CULTURE

## 2014-08-01 ENCOUNTER — Encounter: Payer: Self-pay | Admitting: Family Medicine

## 2014-08-01 DIAGNOSIS — R300 Dysuria: Secondary | ICD-10-CM

## 2014-08-02 MED ORDER — SULFAMETHOXAZOLE-TRIMETHOPRIM 800-160 MG PO TABS: 1.0000 | ORAL_TABLET | Freq: Two times a day (BID) | ORAL | Status: DC

## 2014-08-09 ENCOUNTER — Telehealth: Payer: Self-pay | Admitting: *Deleted

## 2014-09-10 ENCOUNTER — Telehealth: Payer: Self-pay

## 2014-09-10 NOTE — Telephone Encounter (Signed)
The patient called, very upset, that no one called her back to attempt to get her on Dr. Thompson Caul schedule for this week.  The patient stated she called over a month ago.  I advised the patient that Dr. Tamala Julian would be in the walk in clinic on Thursday 09/15/14, but the patient is wanting to be worked in to Dr. Thompson Caul schedule at 104.  Please call the patient at [phone] if this is possible.  The patient stated that Dr. Tamala Julian told her she could be worked in?Marland Kitchen

## 2014-09-11 NOTE — Telephone Encounter (Signed)
Can we get this scheduled for this pt. Thanks

## 2014-09-16 NOTE — Telephone Encounter (Signed)
I have sent message to Dr Tamala Julian to make sure it's ok to overbook this patient into her schedule next week.

## 2014-09-17 ENCOUNTER — Ambulatory Visit (HOSPITAL_COMMUNITY)
Admission: RE | Admit: 2014-09-17 | Discharge: 2014-09-17 | Disposition: A | Discharge status: Home / Self Care | Payer: Medicare Other | Source: Ambulatory Visit | Attending: Family Medicine | Admitting: Family Medicine

## 2014-09-17 ENCOUNTER — Ambulatory Visit (INDEPENDENT_AMBULATORY_CARE_PROVIDER_SITE_OTHER): Payer: Medicare Other

## 2014-09-17 ENCOUNTER — Ambulatory Visit (INDEPENDENT_AMBULATORY_CARE_PROVIDER_SITE_OTHER): Payer: Medicare Other | Admitting: Family Medicine

## 2014-09-17 VITALS — BP 118/80 | HR 84 | Temp 98.0°F | Resp 18 | Ht 61.5 in | Wt 208.8 lb

## 2014-09-17 DIAGNOSIS — G4733 Obstructive sleep apnea (adult) (pediatric): Secondary | ICD-10-CM | POA: Diagnosis not present

## 2014-09-17 DIAGNOSIS — R41840 Attention and concentration deficit: Secondary | ICD-10-CM

## 2014-09-17 DIAGNOSIS — Z860100 Personal history of colon polyps, unspecified: Secondary | ICD-10-CM

## 2014-09-17 DIAGNOSIS — Z9989 Dependence on other enabling machines and devices: Secondary | ICD-10-CM

## 2014-09-17 DIAGNOSIS — I10 Essential (primary) hypertension: Secondary | ICD-10-CM | POA: Diagnosis not present

## 2014-09-17 DIAGNOSIS — M79672 Pain in left foot: Secondary | ICD-10-CM | POA: Diagnosis not present

## 2014-09-17 DIAGNOSIS — M79652 Pain in left thigh: Secondary | ICD-10-CM

## 2014-09-17 DIAGNOSIS — M79662 Pain in left lower leg: Secondary | ICD-10-CM | POA: Diagnosis not present

## 2014-09-17 DIAGNOSIS — F411 Generalized anxiety disorder: Secondary | ICD-10-CM

## 2014-09-17 DIAGNOSIS — M7732 Calcaneal spur, left foot: Secondary | ICD-10-CM

## 2014-09-17 DIAGNOSIS — J4530 Mild persistent asthma, uncomplicated: Secondary | ICD-10-CM | POA: Diagnosis not present

## 2014-09-17 DIAGNOSIS — R635 Abnormal weight gain: Secondary | ICD-10-CM

## 2014-09-17 DIAGNOSIS — M79605 Pain in left leg: Secondary | ICD-10-CM | POA: Diagnosis not present

## 2014-09-17 DIAGNOSIS — M19041 Primary osteoarthritis, right hand: Secondary | ICD-10-CM | POA: Diagnosis not present

## 2014-09-17 DIAGNOSIS — I201 Angina pectoris with documented spasm: Secondary | ICD-10-CM

## 2014-09-17 LAB — CBC WITH DIFFERENTIAL/PLATELET
Basophils Absolute: 0.1 10*3/uL (ref 0.0–0.1)
Basophils Relative: 1 % (ref 0–1)
Eosinophils Absolute: 0.2 10*3/uL (ref 0.0–0.7)
Eosinophils Relative: 3 % (ref 0–5)
HCT: 44.9 % (ref 36.0–46.0)
Hemoglobin: 15 g/dL (ref 12.0–15.0)
LYMPHS ABS: 1.8 10*3/uL (ref 0.7–4.0)
Lymphocytes Relative: 22 % (ref 12–46)
MCH: 30.9 pg (ref 26.0–34.0)
MCHC: 33.4 g/dL (ref 30.0–36.0)
MCV: 92.4 fL (ref 78.0–100.0)
MPV: 10.7 fL (ref 8.6–12.4)
Monocytes Absolute: 0.5 10*3/uL (ref 0.1–1.0)
Monocytes Relative: 6 % (ref 3–12)
NEUTROS ABS: 5.4 10*3/uL (ref 1.7–7.7)
Neutrophils Relative %: 68 % (ref 43–77)
Platelets: 275 10*3/uL (ref 150–400)
RBC: 4.86 MIL/uL (ref 3.87–5.11)
RDW: 12.9 % (ref 11.5–15.5)
WBC: 8 10*3/uL (ref 4.0–10.5)

## 2014-09-17 LAB — COMPREHENSIVE METABOLIC PANEL
ALT: 48 U/L — ABNORMAL HIGH (ref 0–35)
AST: 26 U/L (ref 0–37)
Albumin: 4.3 g/dL (ref 3.5–5.2)
Alkaline Phosphatase: 80 U/L (ref 39–117)
BUN: 13 mg/dL (ref 6–23)
CO2: 25 mEq/L (ref 19–32)
Calcium: 9.5 mg/dL (ref 8.4–10.5)
Chloride: 103 mEq/L (ref 96–112)
Creat: 0.76 mg/dL (ref 0.50–1.10)
Glucose, Bld: 106 mg/dL — ABNORMAL HIGH (ref 70–99)
POTASSIUM: 4.4 meq/L (ref 3.5–5.3)
Sodium: 138 mEq/L (ref 135–145)
TOTAL PROTEIN: 7 g/dL (ref 6.0–8.3)
Total Bilirubin: 1 mg/dL (ref 0.2–1.2)

## 2014-09-17 LAB — TSH: TSH: 2.986 u[IU]/mL (ref 0.350–4.500)

## 2014-09-17 MED ORDER — AMPHETAMINE-DEXTROAMPHET ER 25 MG PO CP24: 25.0000 mg | ORAL_CAPSULE | ORAL | Status: DC

## 2014-09-17 MED ORDER — AMPHETAMINE-DEXTROAMPHETAMINE 5 MG PO TABS: 5.0000 mg | ORAL_TABLET | Freq: Every day | ORAL | Status: DC

## 2014-09-17 MED ORDER — ALBUTEROL SULFATE HFA 108 (90 BASE) MCG/ACT IN AERS: 2.0000 | INHALATION_SPRAY | Freq: Four times a day (QID) | RESPIRATORY_TRACT | Status: DC | PRN

## 2014-09-17 MED ORDER — MELOXICAM 15 MG PO TABS: 15.0000 mg | ORAL_TABLET | Freq: Every day | ORAL | Status: AC

## 2014-09-17 NOTE — Progress Notes (Signed)
Subjective:    Patient ID: [name], female    DOB: [birth year], 49 y.o.   MRN: [id number]  09/17/2014  Follow-up; Leg Swelling; and Medication Refill   HPI This 49 y.o. female presents for four mont hfollow-up:  1.  OSA: doing well on CPAP.  Attended three hour class regarding education of CpaP and fitting.  Wearing full mask.  Having rash on face due to mask.  Sleeping all night; sleeping really well.  Read that can take three months for full effect.  If skips a night, feels poorly for the next two days.  Does not hear the dog during night now.  Less fatigue.  Good compliance immediately; started CPAP 07/23/14.    2.  Dry eyes:  Ophthalmologist commended on dry eyes; eyes are burning, red eyes medially.  No allergic conjunctivitis.    3.  Decreased hearing B: flew to see grandson for birthday.  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  Two weeks ago.  Took two weeks to recover; used nasal spray, Zyrtec.  Had ear pain.  Really not hearing well.  No nasal congestion.  Made appointment on Wednesday with ENT.  Busy environments are worse.  4.  Chest pain:  S/p Myoview that was negative.  Evaluated by PA; able to complete stress test.  No recurrent chest pain.    5.  ADHD: requesting refills on medications.    6.  Asthma: needs to change from ProAir. Will pay for Ventolin.  Denies shortness of breath or recent wheezing.    7.  Colon polyps and family history of colon cancer: last colonoscopy 4-5 years ago by Collene Mares. Feels due for repeat colonoscopy.    8. B heel pain: upon awakening; worried about bone spurs.  Likes to walk on cold foots.  L>R.  Asked be placed on Mobic/Meloxicam.   Prescribed Meloxicam 7.5mg  with mild improvement.    9. L posterior knee lump and pain:  Onset two days before flying.  Hurts constantly.  With flexing leg; with sitting down.  Does not feel like joint involvement.  No calf swelling or redness.  If goes to bend over to pick up something; pain acutely worsens.  Will not squat  down due to pain.  Pain now more proximal.  Does not feel muscular.  No joint swelling.    10. Weight gain: has gained eight pounds in past three months; unable to exercise. Does not understand why continues to gain weight.   11. Anxiety: management changes in 05/2014 was increasing Prozac 40mg  daily; less stress and anxiety; in process of applying to other NP programs; hopeful regarding program in [place].   Review of Systems  Constitutional: Negative for fever, chills, diaphoresis and fatigue.  HENT: Positive for ear pain and hearing loss. Negative for congestion, ear discharge, rhinorrhea, sinus pressure, sneezing, sore throat and trouble swallowing.   Respiratory: Negative for cough, shortness of breath and wheezing.   Cardiovascular: Negative for chest pain, palpitations and leg swelling.  Gastrointestinal: Negative for nausea, vomiting, abdominal pain and diarrhea.  Musculoskeletal: Positive for myalgias, arthralgias and gait problem. Negative for joint swelling.  Skin: Negative for color change and rash.  Psychiatric/Behavioral: The patient is nervous/anxious.     Past Medical History  Diagnosis Date  . Hypertension   . GERD (gastroesophageal reflux disease)   . DVT (deep venous thrombosis) 2002    left leg  . Celiac sprue     biopsy of small intenstin  . Attention deficit disorder with  hyperactivity(314.01)   . IBS (irritable bowel syndrome)   . Allergic rhinitis, cause unspecified   . Optic neuritis, unspecified     both eye  . Eating disorder, unspecified   . Calculus of kidney   . Tachycardia, unspecified   . Apnea   . Other chronic allergic conjunctivitis   . Multiple sclerosis   . Tremor   . Anxiety state, unspecified   . Genital herpes, unspecified   . Rosacea   . Unspecified asthma(493.90)   . Heart murmur   . Benign neoplasm of colon    Past Surgical History  Procedure Laterality Date  . Abdominal hysterectomy  2008  . Cholecystectomy  2002  .  Appendectomy  2005  . Cardiac catheterization  09/2009  . Tonsilectomy, adenoidectomy, bilateral myringotomy and tubes  1987  . Rotator cuff repair  1996    Right  . Nevus resection  06/2010    R upper back; L anterior chest 08/2011.   Allergies  Allergen Reactions  . Codeine     REACTION: hallucinations  . Erythromycin     REACTION: seizures  . Levofloxacin     REACTION: hallucinations  . Oxycodone Hcl     REACTION: passed out  . Telithromycin Rash    Generic for Ketek   Current Outpatient Prescriptions  Medication Sig Dispense Refill  . albuterol (PROVENTIL HFA;VENTOLIN HFA) 108 (90 BASE) MCG/ACT inhaler Inhale 2 puffs into the lungs every 6 (six) hours as needed for wheezing or shortness of breath (cough, shortness of breath or wheezing.). 1 Inhaler 5  . amphetamine-dextroamphetamine (ADDERALL XR) 25 MG 24 hr capsule Take 1 capsule by mouth every morning. Do not fill until 11-17-14. 30 capsule 0  . amphetamine-dextroamphetamine (ADDERALL) 5 MG tablet Take 1 tablet by mouth daily. 30 tablet 0  . cetirizine (ZYRTEC) 10 MG tablet Take 1 tablet (10 mg total) by mouth daily. 90 tablet 1  . desonide (DESOWEN) 0.05 % lotion Apply topically 2 (two) times daily. 59 mL 0  . dexlansoprazole (DEXILANT) 60 MG capsule Take 1 capsule (60 mg total) by mouth 2 (two) times daily. 180 capsule 3  . EPINEPHrine (EPIPEN 2-PAK) 0.3 mg/0.3 mL IJ SOAJ injection Inject 0.3 mLs (0.3 mg total) into the muscle once. 1 Device 4  . FLUoxetine (PROZAC) 20 MG tablet Take 2 tablets (40 mg total) by mouth daily. 180 tablet 0  . fluticasone (FLOVENT HFA) 110 MCG/ACT inhaler Inhale 2 puffs into the lungs 2 (two) times daily. 1 Inhaler 12  . hydrochlorothiazide (HYDRODIURIL) 25 MG tablet Take 1 tablet (25 mg total) by mouth daily. 90 tablet 1  . LORazepam (ATIVAN) 0.5 MG tablet Take 1 tablet (0.5 mg total) by mouth 2 (two) times daily as needed for anxiety. 30 tablet 2  . meloxicam (MOBIC) 7.5 MG tablet Take 1 tablet  (7.5 mg total) by mouth daily. 30 tablet 3  . metoprolol succinate (TOPROL-XL) 25 MG 24 hr tablet Take 1-2 tablets (25-50 mg total) by mouth daily. 180 tablet 0  . nitroGLYCERIN (NITROSTAT) 0.4 MG SL tablet Place 1 tablet (0.4 mg total) under the tongue every 5 (five) minutes as needed for chest pain. NEED REFILLS 30 tablet 0  . triamcinolone ointment (KENALOG) 0.1 % Apply topically 2 (two) times daily. Apply to hands 30 g 0  . valACYclovir (VALTREX) 500 MG tablet Take 1 tablet (500 mg total) by mouth 2 (two) times daily. 180 tablet 1  . albuterol (PROVENTIL) (2.5 MG/3ML) 0.083% nebulizer solution Take 3  mLs (2.5 mg total) by nebulization every 6 (six) hours as needed for wheezing. 75 mL 12   No current facility-administered medications for this visit.       Objective:    BP 118/80 mmHg  Pulse 84  Temp(Src) 98 F (36.7 C) (Oral)  Resp 18  Ht 5' 1.5" (1.562 m)  Wt 208 lb 12.8 oz (94.711 kg)  BMI 38.82 kg/m2  SpO2 98% Physical Exam  Constitutional: She is oriented to person, place, and time. She appears well-developed and well-nourished. No distress.  HENT:  Head: Normocephalic and atraumatic.  Right Ear: External ear normal.  Left Ear: External ear normal.  Nose: Nose normal.  Mouth/Throat: Oropharynx is clear and moist.  Eyes: Conjunctivae and EOM are normal. Pupils are equal, round, and reactive to light.  Neck: Normal range of motion. Neck supple. Carotid bruit is not present. No thyromegaly present.  Cardiovascular: Normal rate, regular rhythm, normal heart sounds and intact distal pulses.  Exam reveals no gallop and no friction rub.   No murmur heard. Pulmonary/Chest: Effort normal and breath sounds normal. She has no wheezes. She has no rales.  Musculoskeletal:       Left knee: Normal. She exhibits normal range of motion, no swelling, no effusion, no erythema and no bony tenderness. No tenderness found. No medial joint line, no lateral joint line and no patellar tendon  tenderness noted.       Left ankle: Normal. She exhibits normal range of motion and no swelling. No tenderness. No lateral malleolus and no medial malleolus tenderness found.       Left upper leg: She exhibits tenderness. She exhibits no bony tenderness and no swelling.       Left lower leg: She exhibits tenderness. She exhibits no bony tenderness and no swelling.       Left foot: Normal. There is normal range of motion, no tenderness, no bony tenderness and no swelling.  L lower extremity:  +TTP proximal posterior thigh near hamstring insertion; pain with resistance of hamstring.  +TTP posterior calf without swelling or cords.    Lymphadenopathy:    She has no cervical adenopathy.  Neurological: She is alert and oriented to person, place, and time. No cranial nerve deficit.  Skin: Skin is warm and dry. No rash noted. She is not diaphoretic. No erythema. No pallor.  Psychiatric: She has a normal mood and affect. Her behavior is normal.   Results for orders placed or performed in visit on 07/21/14  Urine culture  Result Value Ref Range   Colony Count 50,000 COLONIES/ML    Organism ID, Bacteria Multiple bacterial morphotypes present, none    Organism ID, Bacteria predominant. Suggest appropriate recollection if     Organism ID, Bacteria clinically indicated.   POCT urinalysis dipstick  Result Value Ref Range   Color, UA dk yellow    Clarity, UA cloudy    Glucose, UA neg    Bilirubin, UA neg    Ketones, UA neg    Spec Grav, UA 1.025    Blood, UA neg    pH, UA 5.0    Protein, UA neg    Urobilinogen, UA 0.2    Nitrite, UA neg    Leukocytes, UA Trace   POCT UA - Microscopic Only  Result Value Ref Range   WBC, Ur, HPF, POC 0-5    RBC, urine, microscopic 0-2    Bacteria, U Microscopic 3+    Mucus, UA neg    Epithelial cells,  urine per micros 0-3    Crystals, Ur, HPF, POC neg    Casts, Ur, LPF, POC neg    Yeast, UA neg    [facility] reading (PRIMARY) by  Dr. Tamala Julian.  L FOOT FILMS: SMALL  CALCANEUS SPUR.       Assessment & Plan:   1. Asthma, mild persistent, uncomplicated   2. Heel pain, left   3. Weight gain, abnormal   4. Essential hypertension, benign   5. Attention deficit   6. Calf pain, left   7. Heel spur, left   8. Left thigh pain   9. History of colonic polyps      1.  L heel pain/spur:  New.  Recommend icing qhs, stretching via home exercise program, heel cups.  Avoid walking barefooted.  Supportive shoes. If no improvement in one month, refer to podiatry. 2.  L calf/thigh pain: New.  No injury; obtain venous doppler to rule out DVT.  If [place] negative, treat for hamstring and calf strain.  Rx for Meloxicam.   3.  Asthma: stable; rx for Ventolin provided again. 4.  ADHD: controlled; refills x3 provided.  RTC three months. 5. Anxiety: improved with decreased stressors and increase in Prozac to 40mg  daily. 6.  Unintentional weight gain: worsening; obtain TSH; recommend food logging with [email or web address]://www.mat[employer]ws.info/.   7. History of colon polyps: recommend repeat colonoscopy in upcoming year.  8. HTN: controlled; ;obtain labs.  Continue current medications. 9. OSA on CPAP: controlled; feeling better with CPAP nightly.   Meds ordered this encounter  Medications  . albuterol (PROVENTIL HFA;VENTOLIN HFA) 108 (90 BASE) MCG/ACT inhaler    Sig: Inhale 2 puffs into the lungs every 6 (six) hours as needed for wheezing or shortness of breath (cough, shortness of breath or wheezing.).    Dispense:  1 Inhaler    Refill:  5    INSURANCE WILL ONLY COVER VENTOLIN; PLEASE DISPENSE VENTOLIN.  . DISCONTD: amphetamine-dextroamphetamine (ADDERALL XR) 25 MG 24 hr capsule    Sig: Take 1 capsule by mouth every morning.    Dispense:  30 capsule    Refill:  0  . DISCONTD: amphetamine-dextroamphetamine (ADDERALL XR) 25 MG 24 hr capsule    Sig: Take 1 capsule by mouth every morning. Do not fill until 10-18-14.    Dispense:  30 capsule    Refill:  0  . DISCONTD: amphetamine-dextroamphetamine  (ADDERALL) 5 MG tablet    Sig: Take 1 tablet by mouth daily. Do not fill until 10-18-14.    Dispense:  30 tablet    Refill:  0  . amphetamine-dextroamphetamine (ADDERALL XR) 25 MG 24 hr capsule    Sig: Take 1 capsule by mouth every morning. Do not fill until 11-17-14.    Dispense:  30 capsule    Refill:  0  . DISCONTD: amphetamine-dextroamphetamine (ADDERALL) 5 MG tablet    Sig: Take 1 tablet by mouth daily. Do not fill until 11-17-14.    Dispense:  30 tablet    Refill:  0  . amphetamine-dextroamphetamine (ADDERALL) 5 MG tablet    Sig: Take 1 tablet by mouth daily.    Dispense:  30 tablet    Refill:  0    No Follow-up on file.     Kristi Elayne Guerin, M.D. [facility] [address] [place], [place]  [address] [phone] phone [phone] fax

## 2014-09-17 NOTE — Patient Instructions (Addendum)
Go to [facility] and register at the Emergency Department for [name]. DO NOT register as ED patient.    Plantar Fasciitis (Heel Spur Syndrome) with Rehab The plantar fascia is a fibrous, ligament-like, soft-tissue structure that spans the bottom of the foot. Plantar fasciitis is a condition that causes pain in the foot due to inflammation of the tissue. SYMPTOMS   Pain and tenderness on the underneath side of the foot.  Pain that worsens with standing or walking. CAUSES  Plantar fasciitis is caused by irritation and injury to the plantar fascia on the underneath side of the foot. Common mechanisms of injury include:  Direct trauma to bottom of the foot.  Damage to a small nerve that runs under the foot where the main fascia attaches to the heel bone.  Stress placed on the plantar fascia due to bone spurs. RISK INCREASES WITH:   Activities that place stress on the plantar fascia (running, jumping, pivoting, or cutting).  Poor strength and flexibility.  Improperly fitted shoes.  Tight calf muscles.  Flat feet.  Failure to warm-up properly before activity.  Obesity. PREVENTION  Warm up and stretch properly before activity.  Allow for adequate recovery between workouts.  Maintain physical fitness:  Strength, flexibility, and endurance.  Cardiovascular fitness.  Maintain a health body weight.  Avoid stress on the plantar fascia.  Wear properly fitted shoes, including arch supports for individuals who have flat feet. PROGNOSIS  If treated properly, then the symptoms of plantar fasciitis usually resolve without surgery. However, occasionally surgery is necessary. RELATED COMPLICATIONS   Recurrent symptoms that may result in a chronic condition.  Problems of the lower back that are caused by compensating for the injury, such as limping.  Pain or weakness of the foot during push-off following surgery.  Chronic inflammation, scarring, and  partial or complete fascia tear, occurring more often from repeated injections. TREATMENT  Treatment initially involves the use of ice and medication to help reduce pain and inflammation. The use of strengthening and stretching exercises may help reduce pain with activity, especially stretches of the Achilles tendon. These exercises may be performed at home or with a therapist. Your caregiver may recommend that you use heel cups of arch supports to help reduce stress on the plantar fascia. Occasionally, corticosteroid injections are given to reduce inflammation. If symptoms persist for greater than 6 months despite non-surgical (conservative), then surgery may be recommended.  MEDICATION   If pain medication is necessary, then nonsteroidal anti-inflammatory medications, such as aspirin and ibuprofen, or other minor pain relievers, such as acetaminophen, are often recommended.  Do not take pain medication within 7 days before surgery.  Prescription pain relievers may be given if deemed necessary by your caregiver. Use only as directed and only as much as you need.  Corticosteroid injections may be given by your caregiver. These injections should be reserved for the most serious cases, because they may only be given a certain number of times. HEAT AND COLD  Cold treatment (icing) relieves pain and reduces inflammation. Cold treatment should be applied for 10 to 15 minutes every 2 to 3 hours for inflammation and pain and immediately after any activity that aggravates your symptoms. Use ice packs or massage the area with a piece of ice (ice massage).  Heat treatment may be used prior to performing the stretching and strengthening activities prescribed by your caregiver, physical therapist, or athletic trainer. Use a heat pack or soak the injury in warm water. SEEK IMMEDIATE  MEDICAL CARE IF:  Treatment seems to offer no benefit, or the condition worsens.  Any medications produce adverse side  effects. EXERCISES RANGE OF MOTION (ROM) AND STRETCHING EXERCISES - Plantar Fasciitis (Heel Spur Syndrome) These exercises may help you when beginning to rehabilitate your injury. Your symptoms may resolve with or without further involvement from your physician, physical therapist or athletic trainer. While completing these exercises, remember:   Restoring tissue flexibility helps normal motion to return to the joints. This allows healthier, less painful movement and activity.  An effective stretch should be held for at least 30 seconds.  A stretch should never be painful. You should only feel a gentle lengthening or release in the stretched tissue. RANGE OF MOTION - Toe Extension, Flexion  Sit with your right / left leg crossed over your opposite knee.  Grasp your toes and gently pull them back toward the top of your foot. You should feel a stretch on the bottom of your toes and/or foot.  Hold this stretch for __________ seconds.  Now, gently pull your toes toward the bottom of your foot. You should feel a stretch on the top of your toes and or foot.  Hold this stretch for __________ seconds. Repeat __________ times. Complete this stretch __________ times per day.  RANGE OF MOTION - Ankle Dorsiflexion, Active Assisted  Remove shoes and sit on a chair that is preferably not on a carpeted surface.  Place right / left foot under knee. Extend your opposite leg for support.  Keeping your heel down, slide your right / left foot back toward the chair until you feel a stretch at your ankle or calf. If you do not feel a stretch, slide your bottom forward to the edge of the chair, while still keeping your heel down.  Hold this stretch for __________ seconds. Repeat __________ times. Complete this stretch __________ times per day.  STRETCH - Gastroc, Standing  Place hands on wall.  Extend right / left leg, keeping the front knee somewhat bent.  Slightly point your toes inward on your back  foot.  Keeping your right / left heel on the floor and your knee straight, shift your weight toward the wall, not allowing your back to arch.  You should feel a gentle stretch in the right / left calf. Hold this position for __________ seconds. Repeat __________ times. Complete this stretch __________ times per day. STRETCH - Soleus, Standing  Place hands on wall.  Extend right / left leg, keeping the other knee somewhat bent.  Slightly point your toes inward on your back foot.  Keep your right / left heel on the floor, bend your back knee, and slightly shift your weight over the back leg so that you feel a gentle stretch deep in your back calf.  Hold this position for __________ seconds. Repeat __________ times. Complete this stretch __________ times per day. STRETCH - Gastrocsoleus, Standing  Note: This exercise can place a lot of stress on your foot and ankle. Please complete this exercise only if specifically instructed by your caregiver.   Place the ball of your right / left foot on a step, keeping your other foot firmly on the same step.  Hold on to the wall or a rail for balance.  Slowly lift your other foot, allowing your body weight to press your heel down over the edge of the step.  You should feel a stretch in your right / left calf.  Hold this position for __________ seconds.  Repeat this exercise with a slight bend in your right / left knee. Repeat __________ times. Complete this stretch __________ times per day.  STRENGTHENING EXERCISES - Plantar Fasciitis (Heel Spur Syndrome)  These exercises may help you when beginning to rehabilitate your injury. They may resolve your symptoms with or without further involvement from your physician, physical therapist or athletic trainer. While completing these exercises, remember:   Muscles can gain both the endurance and the strength needed for everyday activities through controlled exercises.  Complete these exercises as  instructed by your physician, physical therapist or athletic trainer. Progress the resistance and repetitions only as guided. STRENGTH - Towel Curls  Sit in a chair positioned on a non-carpeted surface.  Place your foot on a towel, keeping your heel on the floor.  Pull the towel toward your heel by only curling your toes. Keep your heel on the floor.  If instructed by your physician, physical therapist or athletic trainer, add ____________________ at the end of the towel. Repeat __________ times. Complete this exercise __________ times per day. STRENGTH - Ankle Inversion  Secure one end of a rubber exercise band/tubing to a fixed object (table, pole). Loop the other end around your foot just before your toes.  Place your fists between your knees. This will focus your strengthening at your ankle.  Slowly, pull your big toe up and in, making sure the band/tubing is positioned to resist the entire motion.  Hold this position for __________ seconds.  Have your muscles resist the band/tubing as it slowly pulls your foot back to the starting position. Repeat __________ times. Complete this exercises __________ times per day.  Document Released: 06/24/2005 Document Revised: 09/16/2011 Document Reviewed: 10/06/2008 [facility] Patient Information 2015 [place], [place]. This information is not intended to replace advice given to you by your health care provider. Make sure you discuss any questions you have with your health care provider.

## 2014-09-17 NOTE — Progress Notes (Signed)
VASCULAR LAB PRELIMINARY  PRELIMINARY  PRELIMINARY  PRELIMINARY  Left lower extremity venous Doppler completed.    Preliminary report:  There is no DVT or SVT noted in the left lower extremity.  Damesha Lawler, RVT 09/17/2014, 11:22 AM

## 2014-09-19 DIAGNOSIS — M9904 Segmental and somatic dysfunction of sacral region: Secondary | ICD-10-CM | POA: Diagnosis not present

## 2014-09-19 DIAGNOSIS — M9901 Segmental and somatic dysfunction of cervical region: Secondary | ICD-10-CM | POA: Diagnosis not present

## 2014-09-19 DIAGNOSIS — M9903 Segmental and somatic dysfunction of lumbar region: Secondary | ICD-10-CM | POA: Diagnosis not present

## 2014-09-19 DIAGNOSIS — M545 Low back pain: Secondary | ICD-10-CM | POA: Diagnosis not present

## 2014-09-21 ENCOUNTER — Other Ambulatory Visit: Payer: Self-pay | Admitting: Family Medicine

## 2014-09-21 DIAGNOSIS — M9904 Segmental and somatic dysfunction of sacral region: Secondary | ICD-10-CM | POA: Diagnosis not present

## 2014-09-21 DIAGNOSIS — M545 Low back pain: Secondary | ICD-10-CM | POA: Diagnosis not present

## 2014-09-21 DIAGNOSIS — H6983 Other specified disorders of Eustachian tube, bilateral: Secondary | ICD-10-CM | POA: Diagnosis not present

## 2014-09-21 DIAGNOSIS — M9903 Segmental and somatic dysfunction of lumbar region: Secondary | ICD-10-CM | POA: Diagnosis not present

## 2014-09-21 DIAGNOSIS — H9203 Otalgia, bilateral: Secondary | ICD-10-CM | POA: Diagnosis not present

## 2014-09-21 DIAGNOSIS — M9901 Segmental and somatic dysfunction of cervical region: Secondary | ICD-10-CM | POA: Diagnosis not present

## 2014-09-22 ENCOUNTER — Telehealth: Payer: Self-pay | Admitting: *Deleted

## 2014-09-22 ENCOUNTER — Other Ambulatory Visit: Payer: Self-pay | Admitting: Family Medicine

## 2014-09-22 NOTE — Telephone Encounter (Signed)
-----   Message from Pauline Good sent at 09/19/2014 10:15 AM EDT ----- Regarding: RE: need f/u appt I called pt and stated she would call back to sched.  Thanks Donnita [place] ----- Message -----    From: Katrine Coho, RN    Sent: 09/19/2014   8:53 AM      To: Pauline Good Subject: FW: need f/u appt                              See note below ----- Message -----    From: Larey Dresser, MD    Sent: 09/16/2014   9:59 PM      To: Katrine Coho, RN, Pauline Good Subject: RE: need f/u appt                              Stress test was normal.  Can followup in 6 weeks unless symptoms new or worsening.  ----- Message -----    From: Pauline Good    Sent: 09/16/2014  12:12 PM      To: Larey Dresser, MD Subject: need f/u appt                                  Dr Aundra Dubin pt was told by Estella Husk from her 07/12/14 appt that she need to f/u with you when she come back on town due to her test she had done. Pt want to know if you can see her next week. Please advise.   Thanks Donnita [place]

## 2014-09-22 NOTE — Telephone Encounter (Signed)
Please call or fax in rx.

## 2014-09-23 NOTE — Telephone Encounter (Signed)
Faxed

## 2014-09-28 DIAGNOSIS — H6983 Other specified disorders of Eustachian tube, bilateral: Secondary | ICD-10-CM | POA: Diagnosis not present

## 2014-09-29 DIAGNOSIS — M9903 Segmental and somatic dysfunction of lumbar region: Secondary | ICD-10-CM | POA: Diagnosis not present

## 2014-09-29 DIAGNOSIS — M545 Low back pain: Secondary | ICD-10-CM | POA: Diagnosis not present

## 2014-09-29 DIAGNOSIS — M9901 Segmental and somatic dysfunction of cervical region: Secondary | ICD-10-CM | POA: Diagnosis not present

## 2014-09-29 DIAGNOSIS — M9904 Segmental and somatic dysfunction of sacral region: Secondary | ICD-10-CM | POA: Diagnosis not present

## 2014-11-08 ENCOUNTER — Other Ambulatory Visit: Payer: Self-pay | Admitting: Family Medicine
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
## 2014-12-02 ENCOUNTER — Encounter: Payer: Self-pay | Admitting: Family Medicine

## 2015-01-11 NOTE — Telephone Encounter (Signed)
Error

## 2015-04-24 ENCOUNTER — Other Ambulatory Visit: Payer: Self-pay | Admitting: Family Medicine

## 2015-06-21 ENCOUNTER — Ambulatory Visit (INDEPENDENT_AMBULATORY_CARE_PROVIDER_SITE_OTHER): Payer: Medicare Other | Admitting: Family Medicine

## 2015-06-21 VITALS — BP 120/80 | HR 85 | Temp 98.4°F | Resp 16 | Ht 61.0 in | Wt 186.4 lb

## 2015-06-21 DIAGNOSIS — R002 Palpitations: Secondary | ICD-10-CM | POA: Diagnosis not present

## 2015-06-21 DIAGNOSIS — Z9989 Dependence on other enabling machines and devices: Secondary | ICD-10-CM

## 2015-06-21 DIAGNOSIS — R809 Proteinuria, unspecified: Secondary | ICD-10-CM | POA: Diagnosis not present

## 2015-06-21 DIAGNOSIS — I10 Essential (primary) hypertension: Secondary | ICD-10-CM

## 2015-06-21 DIAGNOSIS — R310 Gross hematuria: Secondary | ICD-10-CM | POA: Diagnosis not present

## 2015-06-21 DIAGNOSIS — I201 Angina pectoris with documented spasm: Secondary | ICD-10-CM

## 2015-06-21 DIAGNOSIS — K219 Gastro-esophageal reflux disease without esophagitis: Secondary | ICD-10-CM | POA: Diagnosis not present

## 2015-06-21 DIAGNOSIS — F4323 Adjustment disorder with mixed anxiety and depressed mood: Secondary | ICD-10-CM | POA: Diagnosis not present

## 2015-06-21 DIAGNOSIS — J4530 Mild persistent asthma, uncomplicated: Secondary | ICD-10-CM | POA: Diagnosis not present

## 2015-06-21 DIAGNOSIS — F900 Attention-deficit hyperactivity disorder, predominantly inattentive type: Secondary | ICD-10-CM

## 2015-06-21 DIAGNOSIS — R7302 Impaired glucose tolerance (oral): Secondary | ICD-10-CM | POA: Diagnosis not present

## 2015-06-21 DIAGNOSIS — R41840 Attention and concentration deficit: Secondary | ICD-10-CM

## 2015-06-21 DIAGNOSIS — A6000 Herpesviral infection of urogenital system, unspecified: Secondary | ICD-10-CM | POA: Diagnosis not present

## 2015-06-21 DIAGNOSIS — G4733 Obstructive sleep apnea (adult) (pediatric): Secondary | ICD-10-CM | POA: Diagnosis not present

## 2015-06-21 LAB — POCT URINALYSIS DIP (MANUAL ENTRY)
BILIRUBIN UA: NEGATIVE
Blood, UA: NEGATIVE
GLUCOSE UA: NEGATIVE
Ketones, POC UA: NEGATIVE
NITRITE UA: NEGATIVE
Protein Ur, POC: NEGATIVE
Spec Grav, UA: 1.02
UROBILINOGEN UA: 0.2
pH, UA: 6

## 2015-06-21 LAB — POC MICROSCOPIC URINALYSIS (UMFC): MUCUS RE: ABSENT

## 2015-06-21 MED ORDER — AMPHETAMINE-DEXTROAMPHETAMINE 5 MG PO TABS: 5.0000 mg | ORAL_TABLET | Freq: Every day | ORAL | Status: DC

## 2015-06-21 MED ORDER — DEXLANSOPRAZOLE 60 MG PO CPDR: 60.0000 mg | DELAYED_RELEASE_CAPSULE | Freq: Two times a day (BID) | ORAL | Status: DC

## 2015-06-21 MED ORDER — VALACYCLOVIR HCL 500 MG PO TABS
500.0000 mg | ORAL_TABLET | Freq: Two times a day (BID) | ORAL | Status: AC
Start: 2015-06-21 — End: ?

## 2015-06-21 MED ORDER — CETIRIZINE HCL 10 MG PO TABS: 10.0000 mg | ORAL_TABLET | Freq: Every day | ORAL | Status: AC

## 2015-06-21 MED ORDER — AMPHETAMINE-DEXTROAMPHET ER 25 MG PO CP24: 25.0000 mg | ORAL_CAPSULE | ORAL | Status: DC

## 2015-06-21 MED ORDER — TRIAMCINOLONE ACETONIDE 0.1 % EX OINT: TOPICAL_OINTMENT | Freq: Two times a day (BID) | CUTANEOUS | Status: AC

## 2015-06-21 MED ORDER — NITROGLYCERIN 0.4 MG SL SUBL: 0.4000 mg | SUBLINGUAL_TABLET | SUBLINGUAL | Status: AC | PRN

## 2015-06-21 MED ORDER — ALBUTEROL SULFATE (2.5 MG/3ML) 0.083% IN NEBU: 2.5000 mg | INHALATION_SOLUTION | Freq: Four times a day (QID) | RESPIRATORY_TRACT | Status: DC | PRN

## 2015-06-21 MED ORDER — FLUTICASONE PROPIONATE HFA 110 MCG/ACT IN AERO: 2.0000 | INHALATION_SPRAY | Freq: Two times a day (BID) | RESPIRATORY_TRACT | Status: DC

## 2015-06-21 MED ORDER — HYDROCHLOROTHIAZIDE 25 MG PO TABS: 25.0000 mg | ORAL_TABLET | Freq: Every day | ORAL | Status: DC

## 2015-06-21 MED ORDER — EPINEPHRINE 0.3 MG/0.3ML IJ SOAJ: 0.3000 mg | Freq: Once | INTRAMUSCULAR | Status: DC

## 2015-06-21 MED ORDER — AMPHETAMINE-DEXTROAMPHETAMINE 5 MG PO TABS: 5.0000 mg | ORAL_TABLET | Freq: Every day | ORAL | Status: AC

## 2015-06-21 MED ORDER — AMPHETAMINE-DEXTROAMPHET ER 25 MG PO CP24: 25.0000 mg | ORAL_CAPSULE | ORAL | Status: AC

## 2015-06-21 MED ORDER — METOPROLOL SUCCINATE ER 25 MG PO TB24: 25.0000 mg | ORAL_TABLET | Freq: Every day | ORAL | Status: AC

## 2015-06-21 MED ORDER — DESONIDE 0.05 % EX LOTN: TOPICAL_LOTION | Freq: Two times a day (BID) | CUTANEOUS | Status: AC

## 2015-06-21 MED ORDER — FLUOXETINE HCL 20 MG PO TABS: 40.0000 mg | ORAL_TABLET | Freq: Every day | ORAL | Status: AC

## 2015-06-21 MED ORDER — ALBUTEROL SULFATE HFA 108 (90 BASE) MCG/ACT IN AERS: 2.0000 | INHALATION_SPRAY | Freq: Four times a day (QID) | RESPIRATORY_TRACT | Status: DC | PRN

## 2015-06-21 NOTE — Progress Notes (Signed)
Subjective:    Patient ID: [name], female    DOB: 1965-07-11, 49 y.o.   MRN: [id number]  06/21/2015  Follow-up; Medication Refill; and Urinary Tract Infection   HPI This 49 y.o. female presents for nine month follow-up:  1. ADHD: Patient reports good compliance with medication, good tolerance to medication, and good symptom control.  [place].  NH will allow six months of rx.  Has presented to health clinic twice in nine months; then referred out; did see another proivder for an acute issue/pneumonia.  Health Clinic addresses acute issues.  Has not established with PCP. Adderall XR 25mg  daily; sometimes takes 5mg  Adderall; only filled on of five Adderall 5mg ; would like refill.  No side effects.  Focus is good; performance is good.  2.  OSA on CPAP: needs rx for face mask; Patient reports good compliance with medication, good tolerance to medication, and good symptom control.   Wearing CPAP 7-8 hours per night.  90% compliance.  Energy level is good with CPAP.  3.  Anxiety:  Patient reports good compliance with medication, good tolerance to medication, and good symptom control.  Taking Prozac 2 daily; rarely taking Ativan.    4. HTN/palpitatIons: taking Metoprolol ER once daily; also taking HCTZ daily; due for labs.  Must take medicatoins daily.  5.  GERD: Patient reports good compliance with medication, good tolerance to medication, and good symptom control.  Working well.  Has lost 32 pounds since last visit; did [employer]; then starting walking daily.  Bought bike and biking.   GERD much improvement.  Not eating out; watching what eating.    6. Asthma: Patient reports good compliance with medication, good tolerance to medication, and good symptom control.   Using Flovent daily.  Albuterol using 3 times in four months.  S/p flu vaccine.    7.  Hematuria: presented to clinic at [facility] one month ago; had horrible R flank pain.  Duration two days.  Had hematuria  and protein in urine at evaluation.  Has continue to persist; s/p urology consultation afterwards.  Still having pain on R side. Urine cleared.  S/p CT scan11/16/16 negative but R urteter not completely evaluated; repeated CT one week later. S/p cystoscopy in office by urologist; sent urine for cancer screening that was negative.  No tumor in kidney or bladder.  Referred to nephrology.  Running low grade fevers and recurrent and persistent R flank pain.  Exhausted and fatigued.  Energy is down.  S/p hysterectomy.  Dietl crisis?  Where a ureter can kink and become spasmotic; aggressive hydration improves.  With this episode, over-inflated pressure on R lateral side with throbbing sensation; will urinate and symptoms recur.  Concerned about vascular pathology; scheduled for renal duplex with Lasix nuclear study.  Duration of three hours.  No post-void residual.  No reflux concerning.   May warrant renal bx by nephrology.  No evidence of neurogenic bladder.   Urine can be foamy, orange red, greenish; very different.  Has an odor.  Started another medication; tried Pyridium without improvement.  Started Flomax.  Prescribed Tramadol for pain but has not taken it yet due to driving to [place].  Recent creatinine normal.       Review of Systems  Constitutional: Negative for fever, chills, diaphoresis and fatigue.  Eyes: Negative for visual disturbance.  Respiratory: Negative for cough and shortness of breath.   Cardiovascular: Negative for chest pain, palpitations and leg swelling.  Gastrointestinal: Negative for nausea,  vomiting, abdominal pain, diarrhea and constipation.  Endocrine: Negative for cold intolerance, heat intolerance, polydipsia, polyphagia and polyuria.  Neurological: Negative for dizziness, tremors, seizures, syncope, facial asymmetry, speech difficulty, weakness, light-headedness, numbness and headaches.    Past Medical History  Diagnosis Date  . Hypertension   . GERD (gastroesophageal reflux  disease)   . DVT (deep venous thrombosis) ([place]) 2002    left leg  . Celiac sprue     biopsy of small intenstin  . Attention deficit disorder with hyperactivity(314.01)   . IBS (irritable bowel syndrome)   . Allergic rhinitis, cause unspecified   . Optic neuritis, unspecified     both eye  . Eating disorder, unspecified   . Calculus of kidney   . Tachycardia, unspecified   . Apnea   . Other chronic allergic conjunctivitis   . Multiple sclerosis ([place])   . Tremor   . Anxiety state, unspecified   . Genital herpes, unspecified   . Rosacea   . Unspecified asthma(493.90)   . Heart murmur   . Benign neoplasm of colon    Past Surgical History  Procedure Laterality Date  . Abdominal hysterectomy  2008  . Cholecystectomy  2002  . Appendectomy  2005  . Cardiac catheterization  09/2009  . Tonsilectomy, adenoidectomy, bilateral myringotomy and tubes  1987  . Rotator cuff repair  1996    Right  . Nevus resection  06/2010    R upper back; L anterior chest 08/2011.   Allergies  Allergen Reactions  . Codeine     REACTION: hallucinations  . Erythromycin     REACTION: seizures  . Levofloxacin     REACTION: hallucinations  . Oxycodone Hcl     REACTION: passed out  . Telithromycin Rash    Generic for Ketek   Current Outpatient Prescriptions  Medication Sig Dispense Refill  . albuterol (PROVENTIL HFA;VENTOLIN HFA) 108 (90 BASE) MCG/ACT inhaler Inhale 2 puffs into the lungs every 6 (six) hours as needed for wheezing or shortness of breath (cough, shortness of breath or wheezing.). 1 Inhaler 5  . amphetamine-dextroamphetamine (ADDERALL XR) 25 MG 24 hr capsule Take 1 capsule by mouth every morning. Do not fill until 11-19-15 30 capsule 0  . amphetamine-dextroamphetamine (ADDERALL) 5 MG tablet Take 1 tablet (5 mg total) by mouth daily. 30 tablet 0  . cetirizine (ZYRTEC) 10 MG tablet Take 1 tablet (10 mg total) by mouth daily. 90 tablet 3  . desonide (DESOWEN) 0.05 % lotion Apply topically  2 (two) times daily. 59 mL 1  . dexlansoprazole (DEXILANT) 60 MG capsule Take 1 capsule (60 mg total) by mouth 2 (two) times daily. 180 capsule 3  . EPINEPHrine (EPIPEN 2-PAK) 0.3 mg/0.3 mL IJ SOAJ injection Inject 0.3 mLs (0.3 mg total) into the muscle once. 1 Device 4  . FLUoxetine (PROZAC) 20 MG tablet Take 2 tablets (40 mg total) by mouth daily. 180 tablet 1  . fluticasone (FLOVENT HFA) 110 MCG/ACT inhaler Inhale 2 puffs into the lungs 2 (two) times daily. 3 Inhaler 4  . hydrochlorothiazide (HYDRODIURIL) 25 MG tablet Take 1 tablet (25 mg total) by mouth daily. 90 tablet 1  . LORazepam (ATIVAN) 0.5 MG tablet TAKE 1 TABLET BY MOUTH TWICE A DAY AS NEEDED FOR ANXIETY 30 tablet 2  . meloxicam (MOBIC) 15 MG tablet Take 1 tablet (15 mg total) by mouth daily. 30 tablet 2  . metoprolol succinate (TOPROL-XL) 25 MG 24 hr tablet Take 1-2 tablets (25-50 mg total) by mouth daily.  180 tablet 1  . nitroGLYCERIN (NITROSTAT) 0.4 MG SL tablet Place 1 tablet (0.4 mg total) under the tongue every 5 (five) minutes as needed for chest pain. NEED REFILLS 30 tablet 1  . triamcinolone ointment (KENALOG) 0.1 % Apply topically 2 (two) times daily. Apply to hands 30 g 1  . valACYclovir (VALTREX) 500 MG tablet Take 1 tablet (500 mg total) by mouth 2 (two) times daily. 180 tablet 3  . albuterol (PROVENTIL) (2.5 MG/3ML) 0.083% nebulizer solution Take 3 mLs (2.5 mg total) by nebulization every 6 (six) hours as needed. Dx code: J45.30 75 mL 12   No current facility-administered medications for this visit.   Social History   Social History  . Marital Status: Divorced    Spouse Name: N/A  . Number of Children: N/A  . Years of Education: college   Occupational History  . disabled     due to [employer]  . full time student     Nursing   Social History Main Topics  . Smoking status: Never Smoker   . Smokeless tobacco: Never Used  . Alcohol Use: No  . Drug Use: No  . Sexual Activity: Not Currently    Birth Control/  Protection: None, Abstinence   Other Topics Concern  . Not on file   Social History Narrative   Divorced since 1998 after 1 year of marriage, first marriage. Caffeine use: carbonated beverage about 4 serving / day. Lives with best friend Collie Siad, son, best friend;s two sons in house.. Always uses seat belts. Exercise: Inactive. She graduated from [employer].   Family History  Problem Relation Age of Onset  . Cancer Neg Hx     gyn  . Alcohol abuse Mother   . Diabetes Mother   . Coronary artery disease Mother     Carotid Artery DIsease  . Hypertension Mother   . Diabetes Father   . Hypertension Father   . Colon cancer    . Kidney Stones Son   . Celiac disease Son        Objective:    BP 120/80 mmHg  Pulse 85  Temp(Src) 98.4 F (36.9 C) (Oral)  Resp 16  Ht 5\' 1"  (1.549 m)  Wt 186 lb 6.4 oz (84.55 kg)  BMI 35.24 kg/m2  SpO2 99% Physical Exam  Constitutional: She is oriented to person, place, and time. She appears well-developed and well-nourished. No distress.  HENT:  Head: Normocephalic and atraumatic.  Right Ear: External ear normal.  Left Ear: External ear normal.  Nose: Nose normal.  Mouth/Throat: Oropharynx is clear and moist.  Eyes: Conjunctivae and EOM are normal. Pupils are equal, round, and reactive to light.  Neck: Normal range of motion. Neck supple. Carotid bruit is not present. No thyromegaly present.  Cardiovascular: Normal rate, regular rhythm, normal heart sounds and intact distal pulses.  Exam reveals no gallop and no friction rub.   No murmur heard. Pulmonary/Chest: Effort normal and breath sounds normal. She has no wheezes. She has no rales.  Abdominal: Soft. Bowel sounds are normal. She exhibits no distension and no mass. There is no tenderness. There is no rebound and no guarding.  Lymphadenopathy:    She has no cervical adenopathy.  Neurological: She is alert and oriented to person, place, and time. No cranial nerve deficit.  Skin: Skin is  warm and dry. No rash noted. She is not diaphoretic. No erythema. No pallor.  Psychiatric: She has a normal mood and affect. Her behavior is  normal.        Assessment & Plan:   1. Asthma, mild persistent, uncomplicated   2. Gastroesophageal reflux disease without esophagitis   3. Attention deficit hyperactivity disorder (ADHD), predominantly inattentive type   4. Palpitations   5. Essential hypertension   6. Adjustment disorder with mixed anxiety and depressed mood   7. Hematuria, gross   8. Proteinuria   9. Glucose intolerance (impaired glucose tolerance)   10. OSA on CPAP   11. Genital HSV   12. 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 Attention deficit     Orders Placed This Encounter  Procedures  . For home use only DME continuous positive airway pressure (CPAP)    Order Specific Question:  Patient has OSA or probable OSA    Answer:  Yes    Order Specific Question:  Is the patient currently using CPAP in the home    Answer:  Yes    Order Specific Question:  Date of face to face encounter    Answer:  06-21-15    Order Specific Question:  Settings    Answer:  Other see comments    Order Specific Question:  Signs and symptoms of probable OSA  (select all that apply)    Answer:  Snoring    Order Specific Question:  Signs and symptoms of probable OSA  (select all that apply)    Answer:  Witnessed apneas    Order Specific Question:  CPAP supplies needed    Answer:  Mask, headgear, cushions, filters, heated tubing and water chamber  . Urine culture  . CBC with Differential/Platelet  . Comprehensive metabolic panel  . Hemoglobin A1c  . ANA  . Sedimentation Rate  . POCT urinalysis dipstick  . POCT Microscopic Urinalysis (UMFC)   Meds ordered this encounter  Medications  . valACYclovir (VALTREX) 500 MG tablet    Sig: Take 1 tablet (500 mg total) by mouth 2 (two) times daily.    Dispense:  180 tablet    Refill:  3  . triamcinolone ointment (KENALOG) 0.1 %    Sig: Apply  topically 2 (two) times daily. Apply to hands    Dispense:  30 g    Refill:  1  . nitroGLYCERIN (NITROSTAT) 0.4 MG SL tablet    Sig: Place 1 tablet (0.4 mg total) under the tongue every 5 (five) minutes as needed for chest pain. NEED REFILLS    Dispense:  30 tablet    Refill:  1  . metoprolol succinate (TOPROL-XL) 25 MG 24 hr tablet    Sig: Take 1-2 tablets (25-50 mg total) by mouth daily.    Dispense:  180 tablet    Refill:  1  . hydrochlorothiazide (HYDRODIURIL) 25 MG tablet    Sig: Take 1 tablet (25 mg total) by mouth daily.    Dispense:  90 tablet    Refill:  1  . fluticasone (FLOVENT HFA) 110 MCG/ACT inhaler    Sig: Inhale 2 puffs into the lungs 2 (two) times daily.    Dispense:  3 Inhaler    Refill:  4  . FLUoxetine (PROZAC) 20 MG tablet    Sig: Take 2 tablets (40 mg total) by mouth daily.    Dispense:  180 tablet    Refill:  1  . EPINEPHrine (EPIPEN 2-PAK) 0.3 mg/0.3 mL IJ SOAJ injection    Sig: Inject 0.3 mLs (0.3 mg total) into the muscle once.    Dispense:  1 Device    Refill:  4  . dexlansoprazole (DEXILANT) 60 MG capsule    Sig: Take 1 capsule (60 mg total) by mouth 2 (two) times daily.    Dispense:  180 capsule    Refill:  3  . desonide (DESOWEN) 0.05 % lotion    Sig: Apply topically 2 (two) times daily.    Dispense:  59 mL    Refill:  1  . cetirizine (ZYRTEC) 10 MG tablet    Sig: Take 1 tablet (10 mg total) by mouth daily.    Dispense:  90 tablet    Refill:  3  . albuterol (PROVENTIL HFA;VENTOLIN HFA) 108 (90 BASE) MCG/ACT inhaler    Sig: Inhale 2 puffs into the lungs every 6 (six) hours as needed for wheezing or shortness of breath (cough, shortness of breath or wheezing.).    Dispense:  1 Inhaler    Refill:  5    INSURANCE WILL ONLY COVER VENTOLIN; PLEASE DISPENSE VENTOLIN.  . DISCONTD: albuterol (PROVENTIL) (2.5 MG/3ML) 0.083% nebulizer solution    Sig: Take 3 mLs (2.5 mg total) by nebulization every 6 (six) hours as needed for wheezing.    Dispense:  75  mL    Refill:  12  . DISCONTD: amphetamine-dextroamphetamine (ADDERALL XR) 25 MG 24 hr capsule    Sig: Take 1 capsule by mouth every morning. Do not fill until 11-17-14.    Dispense:  30 capsule    Refill:  0  . DISCONTD: amphetamine-dextroamphetamine (ADDERALL) 5 MG tablet    Sig: Take 1 tablet (5 mg total) by mouth daily.    Dispense:  30 tablet    Refill:  0  . DISCONTD: amphetamine-dextroamphetamine (ADDERALL XR) 25 MG 24 hr capsule    Sig: Take 1 capsule by mouth every morning. Do not fill until 07-22-15    Dispense:  30 capsule    Refill:  0  . DISCONTD: amphetamine-dextroamphetamine (ADDERALL) 5 MG tablet    Sig: Take 1 tablet (5 mg total) by mouth daily.    Dispense:  30 tablet    Refill:  0    DO NOT FILL UNTIL 07-22-15.  Marland Kitchen DISCONTD: amphetamine-dextroamphetamine (ADDERALL XR) 25 MG 24 hr capsule    Sig: Take 1 capsule by mouth every morning. Do not fill until 08-22-15    Dispense:  30 capsule    Refill:  0  . DISCONTD: amphetamine-dextroamphetamine (ADDERALL) 5 MG tablet    Sig: Take 1 tablet (5 mg total) by mouth daily.    Dispense:  30 tablet    Refill:  0    DO NOT FILL UNTIL 08-22-15.  Marland Kitchen DISCONTD: amphetamine-dextroamphetamine (ADDERALL XR) 25 MG 24 hr capsule    Sig: Take 1 capsule by mouth every morning. Do not fill until 09-19-15    Dispense:  30 capsule    Refill:  0  . DISCONTD: amphetamine-dextroamphetamine (ADDERALL) 5 MG tablet    Sig: Take 1 tablet (5 mg total) by mouth daily.    Dispense:  30 tablet    Refill:  0    DO NOT FILL UNTIL 09-19-15.  Marland Kitchen DISCONTD: amphetamine-dextroamphetamine (ADDERALL XR) 25 MG 24 hr capsule    Sig: Take 1 capsule by mouth every morning. Do not fill until 10-20-15    Dispense:  30 capsule    Refill:  0  . DISCONTD: amphetamine-dextroamphetamine (ADDERALL) 5 MG tablet    Sig: Take 1 tablet (5 mg total) by mouth daily.    Dispense:  30 tablet    Refill:  0    DO NOT FILL UNTIL 10-20-15.  Marland Kitchen amphetamine-dextroamphetamine (ADDERALL  XR) 25 MG 24 hr capsule    Sig: Take 1 capsule by mouth every morning. Do not fill until 11-19-15    Dispense:  30 capsule    Refill:  0  . amphetamine-dextroamphetamine (ADDERALL) 5 MG tablet    Sig: Take 1 tablet (5 mg total) by mouth daily.    Dispense:  30 tablet    Refill:  0    DO NOT FILL UNTIL 11-19-15.    Return in about 6 months (around 12/20/2015) for recheck.    Kristi Elayne Guerin, M.D. [facility] [address] [place], [place]  [address] [phone] phone [phone] fax

## 2015-06-22 ENCOUNTER — Telehealth: Payer: Self-pay

## 2015-06-22 LAB — COMPREHENSIVE METABOLIC PANEL
ALBUMIN: 4.4 g/dL (ref 3.6–5.1)
ALK PHOS: 82 U/L (ref 33–115)
ALT: 26 U/L (ref 6–29)
AST: 18 U/L (ref 10–35)
BILIRUBIN TOTAL: 0.4 mg/dL (ref 0.2–1.2)
BUN: 17 mg/dL (ref 7–25)
CO2: 29 mmol/L (ref 20–31)
CREATININE: 0.75 mg/dL (ref 0.50–1.10)
Calcium: 9.6 mg/dL (ref 8.6–10.2)
Chloride: 104 mmol/L (ref 98–110)
Glucose, Bld: 116 mg/dL — ABNORMAL HIGH (ref 65–99)
Potassium: 4.3 mmol/L (ref 3.5–5.3)
SODIUM: 140 mmol/L (ref 135–146)
TOTAL PROTEIN: 7 g/dL (ref 6.1–8.1)

## 2015-06-22 LAB — URINE CULTURE: Colony Count: 60000

## 2015-06-22 LAB — CBC WITH DIFFERENTIAL/PLATELET
Basophils Absolute: 0.1 10*3/uL (ref 0.0–0.1)
Basophils Relative: 1 % (ref 0–1)
Eosinophils Absolute: 0.3 10*3/uL (ref 0.0–0.7)
Eosinophils Relative: 3 % (ref 0–5)
HEMATOCRIT: 42.3 % (ref 36.0–46.0)
HEMOGLOBIN: 14.8 g/dL (ref 12.0–15.0)
LYMPHS PCT: 22 % (ref 12–46)
Lymphs Abs: 2.3 10*3/uL (ref 0.7–4.0)
MCH: 31.5 pg (ref 26.0–34.0)
MCHC: 35 g/dL (ref 30.0–36.0)
MCV: 90 fL (ref 78.0–100.0)
MONO ABS: 0.5 10*3/uL (ref 0.1–1.0)
MPV: 10.9 fL (ref 8.6–12.4)
Monocytes Relative: 5 % (ref 3–12)
Neutro Abs: 7.3 10*3/uL (ref 1.7–7.7)
Neutrophils Relative %: 69 % (ref 43–77)
Platelets: 294 10*3/uL (ref 150–400)
RBC: 4.7 MIL/uL (ref 3.87–5.11)
RDW: 12.6 % (ref 11.5–15.5)
WBC: 10.6 10*3/uL — AB (ref 4.0–10.5)

## 2015-06-22 LAB — SEDIMENTATION RATE: Sed Rate: 6 mm/hr (ref 0–20)

## 2015-06-22 LAB — HEMOGLOBIN A1C
HEMOGLOBIN A1C: 5.2 % (ref <5.7)
Mean Plasma Glucose: 103 mg/dL (ref <117)

## 2015-06-23 LAB — ANA: Anti Nuclear Antibody(ANA): NEGATIVE

## 2015-06-27 ENCOUNTER — Other Ambulatory Visit: Payer: Self-pay

## 2015-06-27 ENCOUNTER — Telehealth: Payer: Self-pay

## 2015-06-27 DIAGNOSIS — J4530 Mild persistent asthma, uncomplicated: Secondary | ICD-10-CM

## 2015-06-27 MED ORDER — ALBUTEROL SULFATE (2.5 MG/3ML) 0.083% IN NEBU: 2.5000 mg | INHALATION_SOLUTION | Freq: Four times a day (QID) | RESPIRATORY_TRACT | Status: DC | PRN

## 2015-06-27 NOTE — Telephone Encounter (Signed)
PA completed for desonide lotion on covermymeds. Pending.

## 2015-06-28 NOTE — Telephone Encounter (Signed)
PA approved through 06/26/16. Notified pharm.

## 2015-07-13 ENCOUNTER — Ambulatory Visit (INDEPENDENT_AMBULATORY_CARE_PROVIDER_SITE_OTHER): Payer: Medicare Other | Admitting: Family Medicine

## 2015-07-13 VITALS — BP 120/79 | HR 94 | Temp 98.5°F | Resp 18 | Ht 60.5 in | Wt 187.8 lb

## 2015-07-13 DIAGNOSIS — F430 Acute stress reaction: Secondary | ICD-10-CM | POA: Diagnosis not present

## 2015-07-13 DIAGNOSIS — J4530 Mild persistent asthma, uncomplicated: Secondary | ICD-10-CM | POA: Diagnosis not present

## 2015-07-13 NOTE — Progress Notes (Signed)
Subjective:    Patient ID: [name], female    DOB: 1966/04/20, 50 y.o.   MRN: [id number]  07/13/2015  Other   HPI This 50 y.o. female presents for evaluation for the following:   1. Asthma: symptoms have worsened; forgot Flovent in [place].  Used Symbicort which was left over from previous rx.  Felt immediate relief with Symbicort; has been more wheezy in [place].  Asthma is much better in [place].  [place].  Weather is very different.  Has peak flow meter at home; does not utilize peak flow meter to measure baseline.  Sings a lo  2. Anxiety and depression: major stressors due to personal stressors, school stressors, family stressors.   Review of Systems  Constitutional: Negative for fever, chills, diaphoresis and fatigue.  Eyes: Negative for visual disturbance.  Respiratory: Negative for cough and shortness of breath.   Cardiovascular: Negative for chest pain, palpitations and leg swelling.  Gastrointestinal: Negative for nausea, vomiting, abdominal pain, diarrhea and constipation.  Endocrine: Negative for cold intolerance, heat intolerance, polydipsia, polyphagia and polyuria.  Neurological: Negative for dizziness, tremors, seizures, syncope, facial asymmetry, speech difficulty, weakness, light-headedness, numbness and headaches.    Past Medical History  Diagnosis Date  . Hypertension   . GERD (gastroesophageal reflux disease)   . DVT (deep venous thrombosis) ([facility]) 2002    left leg  . Celiac sprue     biopsy of small intenstin  . Attention deficit disorder with hyperactivity(314.01)   . IBS (irritable bowel syndrome)   . Allergic rhinitis, cause unspecified   . Optic neuritis, unspecified     both eye  . Eating disorder, unspecified   . Calculus of kidney   . Tachycardia, unspecified   . Apnea   . Other chronic allergic conjunctivitis   . Multiple sclerosis ([facility])   . Tremor   . Anxiety state, unspecified   . Genital herpes, unspecified   . Rosacea   .  Unspecified asthma(493.90)   . Heart murmur   . Benign neoplasm of colon    Past Surgical History  Procedure Laterality Date  . Abdominal hysterectomy  2008  . Cholecystectomy  2002  . Appendectomy  2005  . Cardiac catheterization  09/2009  . Tonsilectomy, adenoidectomy, bilateral myringotomy and tubes  1987  . Rotator cuff repair  1996    Right  . Nevus resection  06/2010    R upper back; L anterior chest 08/2011.   Allergies  Allergen Reactions  . Codeine     REACTION: hallucinations  . Erythromycin     REACTION: seizures  . Levofloxacin     REACTION: hallucinations  . Oxycodone Hcl     REACTION: passed out  . Telithromycin Rash    Generic for Ketek   Current Outpatient Prescriptions  Medication Sig Dispense Refill  . albuterol (PROVENTIL HFA;VENTOLIN HFA) 108 (90 BASE) MCG/ACT inhaler Inhale 2 puffs into the lungs every 6 (six) hours as needed for wheezing or shortness of breath (cough, shortness of breath or wheezing.). 1 Inhaler 5  . albuterol (PROVENTIL) (2.5 MG/3ML) 0.083% nebulizer solution Take 3 mLs (2.5 mg total) by nebulization every 6 (six) hours as needed. Dx code: J45.30 75 mL 12  . amphetamine-dextroamphetamine (ADDERALL XR) 25 MG 24 hr capsule Take 1 capsule by mouth every morning. Do not fill until 11-19-15 30 capsule 0  . amphetamine-dextroamphetamine (ADDERALL) 5 MG tablet Take 1 tablet (5 mg total) by mouth daily. 30 tablet 0  .  cetirizine (ZYRTEC) 10 MG tablet Take 1 tablet (10 mg total) by mouth daily. 90 tablet 3  . desonide (DESOWEN) 0.05 % lotion Apply topically 2 (two) times daily. 59 mL 1  . dexlansoprazole (DEXILANT) 60 MG capsule Take 1 capsule (60 mg total) by mouth 2 (two) times daily. 180 capsule 3  . EPINEPHrine (EPIPEN 2-PAK) 0.3 mg/0.3 mL IJ SOAJ injection Inject 0.3 mLs (0.3 mg total) into the muscle once. 1 Device 4  . FLUoxetine (PROZAC) 20 MG tablet Take 2 tablets (40 mg total) by mouth daily. 180 tablet 1  . fluticasone (FLOVENT HFA) 110  MCG/ACT inhaler Inhale 2 puffs into the lungs 2 (two) times daily. 3 Inhaler 4  . hydrochlorothiazide (HYDRODIURIL) 25 MG tablet Take 1 tablet (25 mg total) by mouth daily. 90 tablet 1  . LORazepam (ATIVAN) 0.5 MG tablet TAKE 1 TABLET BY MOUTH TWICE A DAY AS NEEDED FOR ANXIETY 30 tablet 2  . meloxicam (MOBIC) 15 MG tablet Take 1 tablet (15 mg total) by mouth daily. 30 tablet 2  . metoprolol succinate (TOPROL-XL) 25 MG 24 hr tablet Take 1-2 tablets (25-50 mg total) by mouth daily. 180 tablet 1  . nitroGLYCERIN (NITROSTAT) 0.4 MG SL tablet Place 1 tablet (0.4 mg total) under the tongue every 5 (five) minutes as needed for chest pain. NEED REFILLS 30 tablet 1  . triamcinolone ointment (KENALOG) 0.1 % Apply topically 2 (two) times daily. Apply to hands 30 g 1  . valACYclovir (VALTREX) 500 MG tablet Take 1 tablet (500 mg total) by mouth 2 (two) times daily. 180 tablet 3   No current facility-administered medications for this visit.   Social History   Social History  . Marital Status: Divorced    Spouse Name: N/A  . Number of Children: N/A  . Years of Education: college   Occupational History  . disabled     due to [place]  . full time student     Nursing   Social History Main Topics  . Smoking status: Never Smoker   . Smokeless tobacco: Never Used  . Alcohol Use: No  . Drug Use: No  . Sexual Activity: Not Currently    Birth Control/ Protection: None, Abstinence   Other Topics Concern  . Not on file   Social History Narrative   Divorced since 1998 after 1 year of marriage, first marriage. Caffeine use: carbonated beverage about 4 serving / day. Lives with best friend Collie Siad, son, best friend;s two sons in house.. Always uses seat belts. Exercise: Inactive. She graduated from [employer].   Family History  Problem Relation Age of Onset  . Cancer Neg Hx     gyn  . Alcohol abuse Mother   . Diabetes Mother   . Coronary artery disease Mother     Carotid Artery DIsease  .  Hypertension Mother   . Diabetes Father   . Hypertension Father   . Colon cancer    . Kidney Stones Son   . Celiac disease Son        Objective:    BP 120/79 mmHg  Pulse 94  Temp(Src) 98.5 F (36.9 C) (Oral)  Resp 18  Ht 5' 0.5" (1.537 m)  Wt 187 lb 12.8 oz (85.186 kg)  BMI 36.06 kg/m2  SpO2 98% Physical Exam  Constitutional: She is oriented to person, place, and time. She appears well-developed and well-nourished. No distress.  HENT:  Head: Normocephalic and atraumatic.  Right Ear: External ear normal.  Left  Ear: External ear normal.  Nose: Nose normal.  Mouth/Throat: Oropharynx is clear and moist.  Eyes: Conjunctivae and EOM are normal. Pupils are equal, round, and reactive to light.  Neck: Normal range of motion. Neck supple. Carotid bruit is not present. No thyromegaly present.  Cardiovascular: Normal rate, regular rhythm, normal heart sounds and intact distal pulses.  Exam reveals no gallop and no friction rub.   No murmur heard. Pulmonary/Chest: Effort normal and breath sounds normal. She has no wheezes. She has no rales.  Abdominal: Soft. Bowel sounds are normal. She exhibits no distension and no mass. There is no tenderness. There is no rebound and no guarding.  Lymphadenopathy:    She has no cervical adenopathy.  Neurological: She is alert and oriented to person, place, and time. No cranial nerve deficit.  Skin: Skin is warm and dry. No rash noted. She is not diaphoretic. No erythema. No pallor.  Psychiatric: She has a normal mood and affect. Her behavior is normal.   Results for orders placed or performed in visit on 06/21/15  Urine culture  Result Value Ref Range   Colony Count 60,000 COLONIES/ML    Organism ID, Bacteria Multiple bacterial morphotypes present, none    Organism ID, Bacteria predominant. Suggest appropriate recollection if     Organism ID, Bacteria clinically indicated.   CBC with Differential/Platelet  Result Value Ref Range   WBC 10.6 (H)  4.0 - 10.5 K/uL   RBC 4.70 3.87 - 5.11 MIL/uL   Hemoglobin 14.8 12.0 - 15.0 g/dL   HCT 42.3 36.0 - 46.0 %   MCV 90.0 78.0 - 100.0 fL   MCH 31.5 26.0 - 34.0 pg   MCHC 35.0 30.0 - 36.0 g/dL   RDW 12.6 11.5 - 15.5 %   Platelets 294 150 - 400 K/uL   MPV 10.9 8.6 - 12.4 fL   Neutrophils Relative % 69 43 - 77 %   Neutro Abs 7.3 1.7 - 7.7 K/uL   Lymphocytes Relative 22 12 - 46 %   Lymphs Abs 2.3 0.7 - 4.0 K/uL   Monocytes Relative 5 3 - 12 %   Monocytes Absolute 0.5 0.1 - 1.0 K/uL   Eosinophils Relative 3 0 - 5 %   Eosinophils Absolute 0.3 0.0 - 0.7 K/uL   Basophils Relative 1 0 - 1 %   Basophils Absolute 0.1 0.0 - 0.1 K/uL   Smear Review Criteria for review not met   Comprehensive metabolic panel  Result Value Ref Range   Sodium 140 135 - 146 mmol/L   Potassium 4.3 3.5 - 5.3 mmol/L   Chloride 104 98 - 110 mmol/L   CO2 29 20 - 31 mmol/L   Glucose, Bld 116 (H) 65 - 99 mg/dL   BUN 17 7 - 25 mg/dL   Creat 0.75 0.50 - 1.10 mg/dL   Total Bilirubin 0.4 0.2 - 1.2 mg/dL   Alkaline Phosphatase 82 33 - 115 U/L   AST 18 10 - 35 U/L   ALT 26 6 - 29 U/L   Total Protein 7.0 6.1 - 8.1 g/dL   Albumin 4.4 3.6 - 5.1 g/dL   Calcium 9.6 8.6 - 10.2 mg/dL  Hemoglobin A1c  Result Value Ref Range   Hgb A1c MFr Bld 5.2 <5.7 %   Mean Plasma Glucose 103 <117 mg/dL  ANA  Result Value Ref Range   Anit Nuclear Antibody(ANA) NEG NEGATIVE  Sedimentation Rate  Result Value Ref Range   Sed Rate 6 0 -  20 mm/hr  POCT urinalysis dipstick  Result Value Ref Range   Color, UA yellow yellow   Clarity, UA clear clear   Glucose, UA negative negative   Bilirubin, UA negative negative   Ketones, POC UA negative negative   Spec Grav, UA 1.020    Blood, UA negative negative   pH, UA 6.0    Protein Ur, POC negative negative   Urobilinogen, UA 0.2    Nitrite, UA Negative Negative   Leukocytes, UA Trace (A) Negative  POCT Microscopic Urinalysis ([facility])  Result Value Ref Range   WBC,UR,HPF,POC Few (A) None  WBC/hpf   RBC,UR,HPF,POC None None RBC/hpf   Bacteria Few (A) None, Too numerous to count   Mucus Absent Absent   Epithelial Cells, UR Per Microscopy Few (A) None, Too numerous to count cells/hpf       Assessment & Plan:   1. Asthma, mild persistent, uncomplicated   2. Stress reaction     No orders of the defined types were placed in this encounter.   No orders of the defined types were placed in this encounter.    Return in about 6 months (around 01/10/2016).    Eusebio Blazejewski Elayne Guerin, M.D. [facility] [address] [place], [place]  [address] [phone] phone [phone] fax

## 2015-08-26 ENCOUNTER — Other Ambulatory Visit: Payer: Self-pay | Admitting: Family Medicine

## 2016-02-27 DIAGNOSIS — M545 Low back pain: Secondary | ICD-10-CM | POA: Diagnosis not present

## 2016-02-27 DIAGNOSIS — M9901 Segmental and somatic dysfunction of cervical region: Secondary | ICD-10-CM | POA: Diagnosis not present

## 2016-02-27 DIAGNOSIS — M9903 Segmental and somatic dysfunction of lumbar region: Secondary | ICD-10-CM | POA: Diagnosis not present

## 2016-02-27 DIAGNOSIS — M9902 Segmental and somatic dysfunction of thoracic region: Secondary | ICD-10-CM | POA: Diagnosis not present

## 2016-04-02 DIAGNOSIS — M9903 Segmental and somatic dysfunction of lumbar region: Secondary | ICD-10-CM | POA: Diagnosis not present

## 2016-04-02 DIAGNOSIS — M9901 Segmental and somatic dysfunction of cervical region: Secondary | ICD-10-CM | POA: Diagnosis not present

## 2016-04-02 DIAGNOSIS — M545 Low back pain: Secondary | ICD-10-CM | POA: Diagnosis not present

## 2016-04-02 DIAGNOSIS — M9902 Segmental and somatic dysfunction of thoracic region: Secondary | ICD-10-CM | POA: Diagnosis not present

## 2016-04-04 DIAGNOSIS — M9901 Segmental and somatic dysfunction of cervical region: Secondary | ICD-10-CM | POA: Diagnosis not present

## 2016-04-04 DIAGNOSIS — M545 Low back pain: Secondary | ICD-10-CM | POA: Diagnosis not present

## 2016-04-04 DIAGNOSIS — M9902 Segmental and somatic dysfunction of thoracic region: Secondary | ICD-10-CM | POA: Diagnosis not present

## 2016-04-04 DIAGNOSIS — M9903 Segmental and somatic dysfunction of lumbar region: Secondary | ICD-10-CM | POA: Diagnosis not present

## 2016-04-08 DIAGNOSIS — M9901 Segmental and somatic dysfunction of cervical region: Secondary | ICD-10-CM | POA: Diagnosis not present

## 2016-04-08 DIAGNOSIS — M9902 Segmental and somatic dysfunction of thoracic region: Secondary | ICD-10-CM | POA: Diagnosis not present

## 2016-04-08 DIAGNOSIS — M9903 Segmental and somatic dysfunction of lumbar region: Secondary | ICD-10-CM | POA: Diagnosis not present

## 2016-04-08 DIAGNOSIS — M545 Low back pain: Secondary | ICD-10-CM | POA: Diagnosis not present

## 2016-06-11 DIAGNOSIS — M545 Low back pain: Secondary | ICD-10-CM | POA: Diagnosis not present

## 2016-06-11 DIAGNOSIS — M9903 Segmental and somatic dysfunction of lumbar region: Secondary | ICD-10-CM | POA: Diagnosis not present

## 2016-06-11 DIAGNOSIS — M9901 Segmental and somatic dysfunction of cervical region: Secondary | ICD-10-CM | POA: Diagnosis not present

## 2016-06-11 DIAGNOSIS — M9902 Segmental and somatic dysfunction of thoracic region: Secondary | ICD-10-CM | POA: Diagnosis not present

## 2016-06-12 DIAGNOSIS — M9902 Segmental and somatic dysfunction of thoracic region: Secondary | ICD-10-CM | POA: Diagnosis not present

## 2016-06-12 DIAGNOSIS — M9903 Segmental and somatic dysfunction of lumbar region: Secondary | ICD-10-CM | POA: Diagnosis not present

## 2016-06-12 DIAGNOSIS — M9901 Segmental and somatic dysfunction of cervical region: Secondary | ICD-10-CM | POA: Diagnosis not present

## 2016-06-12 DIAGNOSIS — M545 Low back pain: Secondary | ICD-10-CM | POA: Diagnosis not present

## 2016-06-19 ENCOUNTER — Encounter: Payer: Self-pay | Admitting: Family Medicine

## 2016-06-19 ENCOUNTER — Ambulatory Visit (INDEPENDENT_AMBULATORY_CARE_PROVIDER_SITE_OTHER): Payer: Medicare Other | Admitting: Family Medicine

## 2016-06-19 VITALS — BP 120/76 | HR 80 | Temp 98.4°F | Resp 16 | Ht 62.0 in | Wt 210.0 lb

## 2016-06-19 DIAGNOSIS — J4540 Moderate persistent asthma, uncomplicated: Secondary | ICD-10-CM

## 2016-06-19 DIAGNOSIS — R739 Hyperglycemia, unspecified: Secondary | ICD-10-CM

## 2016-06-19 DIAGNOSIS — L301 Dyshidrosis [pompholyx]: Secondary | ICD-10-CM

## 2016-06-19 DIAGNOSIS — J301 Allergic rhinitis due to pollen: Secondary | ICD-10-CM

## 2016-06-19 DIAGNOSIS — I10 Essential (primary) hypertension: Secondary | ICD-10-CM | POA: Diagnosis not present

## 2016-06-19 DIAGNOSIS — IMO0001 Reserved for inherently not codable concepts without codable children: Secondary | ICD-10-CM

## 2016-06-19 DIAGNOSIS — Z111 Encounter for screening for respiratory tuberculosis: Secondary | ICD-10-CM

## 2016-06-19 DIAGNOSIS — K760 Fatty (change of) liver, not elsewhere classified: Secondary | ICD-10-CM | POA: Diagnosis not present

## 2016-06-19 DIAGNOSIS — Z0000 Encounter for general adult medical examination without abnormal findings: Secondary | ICD-10-CM

## 2016-06-19 DIAGNOSIS — F418 Other specified anxiety disorders: Secondary | ICD-10-CM | POA: Diagnosis not present

## 2016-06-19 DIAGNOSIS — F419 Anxiety disorder, unspecified: Secondary | ICD-10-CM

## 2016-06-19 DIAGNOSIS — B0229 Other postherpetic nervous system involvement: Secondary | ICD-10-CM | POA: Diagnosis not present

## 2016-06-19 DIAGNOSIS — F900 Attention-deficit hyperactivity disorder, predominantly inattentive type: Secondary | ICD-10-CM | POA: Diagnosis not present

## 2016-06-19 DIAGNOSIS — F329 Major depressive disorder, single episode, unspecified: Secondary | ICD-10-CM

## 2016-06-19 DIAGNOSIS — G4733 Obstructive sleep apnea (adult) (pediatric): Secondary | ICD-10-CM

## 2016-06-19 DIAGNOSIS — R7302 Impaired glucose tolerance (oral): Secondary | ICD-10-CM | POA: Diagnosis not present

## 2016-06-19 DIAGNOSIS — K219 Gastro-esophageal reflux disease without esophagitis: Secondary | ICD-10-CM | POA: Diagnosis not present

## 2016-06-19 DIAGNOSIS — Z6835 Body mass index (BMI) 35.0-35.9, adult: Secondary | ICD-10-CM

## 2016-06-19 DIAGNOSIS — Z91013 Allergy to seafood: Secondary | ICD-10-CM

## 2016-06-19 DIAGNOSIS — Z9989 Dependence on other enabling machines and devices: Secondary | ICD-10-CM

## 2016-06-19 DIAGNOSIS — F430 Acute stress reaction: Secondary | ICD-10-CM

## 2016-06-19 DIAGNOSIS — Z114 Encounter for screening for human immunodeficiency virus [HIV]: Secondary | ICD-10-CM

## 2016-06-19 DIAGNOSIS — E6609 Other obesity due to excess calories: Secondary | ICD-10-CM

## 2016-06-19 LAB — POCT URINALYSIS DIP (MANUAL ENTRY)
Bilirubin, UA: NEGATIVE
Glucose, UA: NEGATIVE
Ketones, POC UA: NEGATIVE
Nitrite, UA: NEGATIVE
PROTEIN UA: NEGATIVE
SPEC GRAV UA: 1.02
UROBILINOGEN UA: 0.2
pH, UA: 5

## 2016-06-19 MED ORDER — ALBUTEROL SULFATE HFA 108 (90 BASE) MCG/ACT IN AERS: 2.0000 | INHALATION_SPRAY | Freq: Four times a day (QID) | RESPIRATORY_TRACT | 5 refills | Status: DC | PRN

## 2016-06-19 MED ORDER — EPINEPHRINE 0.3 MG/0.3ML IJ SOAJ: 0.3000 mg | Freq: Once | INTRAMUSCULAR | 1 refills | Status: AC

## 2016-06-19 MED ORDER — DEXLANSOPRAZOLE 60 MG PO CPDR: 60.0000 mg | DELAYED_RELEASE_CAPSULE | Freq: Two times a day (BID) | ORAL | 3 refills | Status: AC

## 2016-06-19 MED ORDER — HYDROCHLOROTHIAZIDE 25 MG PO TABS: 25.0000 mg | ORAL_TABLET | Freq: Every day | ORAL | 3 refills | Status: DC

## 2016-06-19 MED ORDER — EPINEPHRINE 0.3 MG/0.3ML IJ SOAJ: 0.3000 mg | Freq: Once | INTRAMUSCULAR | 1 refills | Status: DC

## 2016-06-19 MED ORDER — GABAPENTIN 100 MG PO CAPS: 100.0000 mg | ORAL_CAPSULE | Freq: Three times a day (TID) | ORAL | 3 refills | Status: AC

## 2016-06-19 NOTE — Patient Instructions (Signed)
     IF you received an x-ray today, you will receive an invoice from [facility]. Please contact [facility] at [phone] with questions or concerns regarding your invoice.   IF you received labwork today, you will receive an invoice from [facility]. Please contact [facility] at [phone] with questions or concerns regarding your invoice.   Our billing staff will not be able to assist you with questions regarding bills from these companies.  You will be contacted with the lab results as soon as they are available. The fastest way to get your results is to activate your My Chart account. Instructions are located on the last page of this paperwork. If you have not heard from us regarding the results in 2 weeks, please contact this office.      

## 2016-06-19 NOTE — Progress Notes (Signed)
Subjective:    Patient ID: [name], female    DOB: 05/28/1966, 50 y.o.   MRN: [id number]  06/19/2016  Follow-up (annual )   HPI This 50 y.o. female presents for Complete physical examination.  B ear infection: s/p ENT consultation; worried about perforated ENT; [place]in placed on Ciprodex for one month to soften drainage.  Able to scrape out crystalized debris. Follow-up in January 2018.  On 06/14/16, had acute illness; not as severe as roommate.  Started having pressure in face and ears.  Taking Augmentin bid for five days.  Ears are still popping.  Stil lhas fluid in ears. Weird growth in ear canal. Usually R ear is issue. L ear was more infected.  Constant ear pain on R side.    Herpes Zoster: diagnosed on 03/10/16.  R sided cervical region and occipital region; scars present; prescribed Valtrex.  Shooting pain into R ear. The following day, went to ED; had swelling and drawing; placed on Prednisone; developed numbness on R face.  Round of prednisone with shingles.  On 03/11/16, WBC 14, 7000; repeated on 9/13 WBC 15, 700. Had CRP that was elevated.  Then, when saw provider on 12/8, WBC persistently elevated; recommended follow-up.  Not urgent, yet not normal.  Was unable to repeat 05/06/16.  Still gets pain in R ear; will feel like neck on fire; neck is warmer to touch still.  [place] on beach for three hours; felt spot on neck; enlarged.  Rash developed.   For six weeks, did not feel as much; did have dizziness.  Severe pain.  Do nothing to treat pain; recommend nothing.  Refused to prescribe anything. CPAP straps will bother pt on site.  Still having tingling and burning sensation; might take six months.  Very painful.    Needs PPD.    Was extremely stressed over the summer; started seeing counselor three times per week over the summer. Had a horrible summer.  Has nothing to do with school; has a 4.0; very happy with current school.  If in rotations, does well.  Mother came and wrecked  in life; was having phone sex in house. Concerned about early dementia.  Showing naked pictures of people to patient. Using money from settlement to pay for female prostitutes.   Walked into a crisis counseling center; mainly to focus on taking care of self to allow to provide care to mother. Pt now staying with son and his wife.  Gets really stressed about seeing mother over the holidays.  Son lives ten minutes from mother; has avoided seeing grandson to avoid seeing mother.  Mother is 3 years old. Mother has not gone to doctor in past year; diagnosed with diabetes in 72s.  Pt coordinated appointment.  Lives alone.  HgbA1c 14.4.  Stage III kidney failure.  Trying to graduate.  Seeing NP psychiatrist; seeing pt for anxiety and depression; increased Fluoxetine 30mg  bid.  Needed extra dose.   Still seeing counselor regularly; helping cope with life stressors.  Mother is a narcissist.   S/p colonoscopy and UGI; all is great.  Repeat in five years.   S/p hysterectomy Mammogram 2011; agrees to obtain.  OSA with CPAP: needs another CPAP letter for life.  Uses filters, tubing, water chamber, mask.  Gets a choice of mask; wants F20 instead of F10 due to cushion; memory foam.  Has appointment Wednesday to be fitted for mask.   Autotitration 5-20.  Compliance report on smart phone shows 96% compliance every day  with CPAP use.   Review of Systems  Constitutional: Negative for activity change, appetite change, chills, diaphoresis, fatigue, fever and unexpected weight change.  HENT: Positive for congestion and ear pain. Negative for dental problem, drooling, ear discharge, facial swelling, hearing loss, mouth sores, nosebleeds, postnasal drip, rhinorrhea, sinus pressure, sneezing, sore throat, tinnitus, trouble swallowing and voice change.   Eyes: Negative for photophobia, pain, discharge, redness, itching and visual disturbance.  Respiratory: Negative for apnea, cough, choking, chest tightness, shortness of breath,  wheezing and stridor.   Cardiovascular: Negative for chest pain, palpitations and leg swelling.  Gastrointestinal: Negative for abdominal distention, abdominal pain, anal bleeding, blood in stool, constipation, diarrhea, nausea, rectal pain and vomiting.  Endocrine: Negative for cold intolerance, heat intolerance, polydipsia, polyphagia and polyuria.  Genitourinary: Negative for decreased urine volume, difficulty urinating, dyspareunia, dysuria, enuresis, flank pain, frequency, genital sores, hematuria, menstrual problem, pelvic pain, urgency, vaginal bleeding, vaginal discharge and vaginal pain.  Musculoskeletal: Positive for neck pain. Negative for arthralgias, back pain, gait problem, joint swelling, myalgias and neck stiffness.  Skin: Negative for color change, pallor, rash and wound.  Allergic/Immunologic: Negative for environmental allergies, food allergies and immunocompromised state.  Neurological: Positive for numbness. Negative for dizziness, tremors, seizures, syncope, facial asymmetry, speech difficulty, weakness, light-headedness and headaches.  Hematological: Negative for adenopathy. Does not bruise/bleed easily.  Psychiatric/Behavioral: Positive for sleep disturbance. Negative for agitation, behavioral problems, confusion, decreased concentration, dysphoric mood, hallucinations, self-injury and suicidal ideas. The patient is nervous/anxious. The patient is not hyperactive.     Past Medical History:  Diagnosis Date  . Allergic rhinitis, cause unspecified   . Anxiety state, unspecified   . Apnea   . Attention deficit disorder with hyperactivity(314.01)   . Benign neoplasm of colon   . Calculus of kidney   . Celiac sprue    biopsy of small intenstin  . DVT (deep venous thrombosis) ([place]) 2002   left leg  . Eating disorder, unspecified   . Genital herpes, unspecified   . GERD (gastroesophageal reflux disease)   . Heart murmur   . Hypertension   . IBS (irritable bowel syndrome)    . Multiple sclerosis ([place])   . Optic neuritis, unspecified    both eye  . Other chronic allergic conjunctivitis   . Rosacea   . Tachycardia, unspecified   . Tremor   . Unspecified asthma(493.90)    Past Surgical History:  Procedure Laterality Date  . ABDOMINAL HYSTERECTOMY  2008  . APPENDECTOMY  2005  . CARDIAC CATHETERIZATION  09/2009  . CHOLECYSTECTOMY  2002  . nevus resection  06/2010   R upper back; L anterior chest 08/2011.  Marland Kitchen ROTATOR CUFF REPAIR  1996   Right  . TONSILECTOMY, ADENOIDECTOMY, BILATERAL MYRINGOTOMY AND TUBES  1987   Allergies  Allergen Reactions  . Codeine     REACTION: hallucinations  . Erythromycin     REACTION: seizures  . Levofloxacin     REACTION: hallucinations  . Oxycodone Hcl     REACTION: passed out  . Telithromycin Rash    Generic for Ketek    Social History   Social History  . Marital status: Divorced    Spouse name: N/A  . Number of children: N/A  . Years of education: college   Occupational History  . disabled     due to [place]  . full time student     Nursing   Social History Main Topics  . Smoking status: Never Smoker  . Smokeless tobacco:  Never Used  . Alcohol use No  . Drug use: No  . Sexual activity: Not Currently    Birth control/ protection: None, Abstinence   Other Topics Concern  . Not on file   Social History Narrative   Divorced since 1998 after 1 year of marriage, first marriage. Caffeine use: carbonated beverage about 4 serving / day. Lives with best friend Collie Siad, son, best friend;s two sons in house.. Always uses seat belts. Exercise: Inactive. She graduated from [employer].   Family History  Problem Relation Age of Onset  . Alcohol abuse Mother   . Diabetes Mother   . Coronary artery disease Mother     Carotid Artery DIsease  . Hypertension Mother   . Diabetes Father   . Hypertension Father   . Kidney Stones Son   . Celiac disease Son   . Colon cancer    . Cancer Neg Hx     gyn         Objective:    BP 120/76 (BP Location: Right Arm, Patient Position: Sitting, Cuff Size: Large)   Pulse 80   Temp 98.4 F (36.9 C) (Oral)   Resp 16   Ht 5\' 2"  (1.575 m)   Wt 210 lb (95.3 kg)   SpO2 97%   BMI 38.41 kg/m  Physical Exam  Constitutional: She is oriented to person, place, and time. She appears well-developed and well-nourished. No distress.  HENT:  Head: Normocephalic and atraumatic.  Right Ear: External ear normal.  Left Ear: External ear normal.  Nose: Nose normal.  Mouth/Throat: Oropharynx is clear and moist.  TM tubes present and without active drainage; no erythema of TMs.  Eyes: Conjunctivae and EOM are normal. Pupils are equal, round, and reactive to light.  Neck: Normal range of motion and full passive range of motion without pain. Neck supple. No JVD present. Carotid bruit is not present. No thyromegaly present.  Cardiovascular: Normal rate, regular rhythm and normal heart sounds.  Exam reveals no gallop and no friction rub.   No murmur heard. Pulmonary/Chest: Effort normal and breath sounds normal. She has no wheezes. She has no rales.  Abdominal: Soft. Bowel sounds are normal. She exhibits no distension and no mass. There is no tenderness. There is no rebound and no guarding.  Musculoskeletal:       Right shoulder: Normal.       Left shoulder: Normal.       Cervical back: Normal.  Lymphadenopathy:    She has no cervical adenopathy.  Neurological: She is alert and oriented to person, place, and time. She has normal reflexes. No cranial nerve deficit. She exhibits normal muscle tone. Coordination normal.  Skin: Skin is warm and dry. No rash noted. She is not diaphoretic. No erythema. No pallor.  Psychiatric: She has a normal mood and affect. Her behavior is normal. Judgment and thought content normal.  Nursing note and vitals reviewed.  Results for orders placed or performed in visit on 06/19/16  POCT urinalysis dipstick  Result Value Ref Range   Color,  UA yellow yellow   Clarity, UA clear clear   Glucose, UA negative negative   Bilirubin, UA negative negative   Ketones, POC UA negative negative   Spec Grav, UA 1.020    Blood, UA trace-intact (A) negative   pH, UA 5.0    Protein Ur, POC negative negative   Urobilinogen, UA 0.2    Nitrite, UA Negative Negative   Leukocytes, UA Trace (A) Negative  Fall Risk  06/19/2016  Falls in the past year? No   Depression screen [facility] 2/9 06/19/2016 07/13/2015 06/21/2015  Decreased Interest 0 0 0  Down, Depressed, Hopeless 0 0 0  PHQ - 2 Score 0 0 0   Functional Status Survey: Is the patient deaf or have difficulty hearing?: No Does the patient have difficulty seeing, even when wearing glasses/contacts?: No Does the patient have difficulty concentrating, remembering, or making decisions?: No Does the patient have difficulty walking or climbing stairs?: No Does the patient have difficulty dressing or bathing?: No Does the patient have difficulty doing errands alone such as visiting a doctor's office or shopping?: No     Assessment & Plan:   1. Encounter for Medicare annual wellness exam   2. Essential hypertension, benign   3. Glucose intolerance (impaired glucose tolerance)   4. Anxiety and depression   5. Gastroesophageal reflux disease without esophagitis   6. Postherpetic neuralgia   7. Screening for HIV (human immunodeficiency virus)   8. Screening examination for pulmonary tuberculosis   9. OSA on CPAP   10. Chronic seasonal allergic rhinitis due to pollen   11. Moderate persistent extrinsic asthma without complication   12. Hepatic steatosis   13. Eczema, dyshidrotic   14. Attention deficit hyperactivity disorder (ADHD), predominantly inattentive type   15. Hyperglycemia   16. Stress reaction   17. Shellfish allergy   18. Class 2 obesity due to excess calories with serious comorbidity and body mass index (BMI) of 35.0 to 35.9 in adult    -anticipatory guidance --- exercise,  weight loss, low-calorie food choices, calcium 3 servings daily. -no longer warrants pap smears due to hysterectomy status. -agrees to undergo mammogram. -s/p recent colonoscopy. -immunizations UTD; forms completed for NP schooling. -acute otitis media improving with Augment; no change in therapy. -recent Herpes Zoster; suffering with postherpetic neuralgia; rx for Gabapentin provided. -compliance with CPAP usage; obtain compliance report from [facility]; new rx for CPAP provided. -PPD placed; RTC 48-72 hours for read. -refills provided; hypertension well controlled; GERD worsening due to weight gain/ -anxiety and ADD being managed by psychiatry in [place].   Orders Placed This Encounter  Procedures  . For home use only DME continuous positive airway pressure (CPAP)    Autotitration 5-20; LIFETIME RX; mask of choice.    Order Specific Question:   Patient has OSA or probable OSA    Answer:   Yes    Order Specific Question:   Is the patient currently using CPAP in the home    Answer:   Yes    Order Specific Question:   Settings    Answer:   Autotitration    Order Specific Question:   CPAP supplies needed    Answer:   Mask, headgear, cushions, filters, heated tubing and water chamber  . CBC with Differential/Platelet  . Comprehensive metabolic panel    Order Specific Question:   Has the patient fasted?    Answer:   Yes  . Lipid panel    Order Specific Question:   Has the patient fasted?    Answer:   Yes  . TSH  . Hemoglobin A1c  . HIV antibody  . POCT urinalysis dipstick  . TB Skin Test    Order Specific Question:   Has patient ever tested positive?    Answer:   No   Meds ordered this encounter  Medications  . DISCONTD: albuterol (PROVENTIL HFA;VENTOLIN HFA) 108 (90 Base) MCG/ACT inhaler    Sig: Inhale  2 puffs into the lungs every 6 (six) hours as needed for wheezing or shortness of breath (cough, shortness of breath or wheezing.).    Dispense:  1 Inhaler    Refill:  5    INSURANCE  WILL ONLY COVER VENTOLIN; PLEASE DISPENSE VENTOLIN.  Marland Kitchen dexlansoprazole (DEXILANT) 60 MG capsule    Sig: Take 1 capsule (60 mg total) by mouth 2 (two) times daily.    Dispense:  180 capsule    Refill:  3  . hydrochlorothiazide (HYDRODIURIL) 25 MG tablet    Sig: Take 1 tablet (25 mg total) by mouth daily.    Dispense:  90 tablet    Refill:  3  . DISCONTD: EPINEPHrine (EPIPEN 2-PAK) 0.3 mg/0.3 mL IJ SOAJ injection    Sig: Inject 0.3 mLs (0.3 mg total) into the muscle once.    Dispense:  1 Device    Refill:  1  . gabapentin (NEURONTIN) 100 MG capsule    Sig: Take 1-2 capsules (100-200 mg total) by mouth 3 (three) times daily.    Dispense:  180 capsule    Refill:  3  . albuterol (PROVENTIL HFA;VENTOLIN HFA) 108 (90 Base) MCG/ACT inhaler    Sig: Inhale 2 puffs into the lungs every 6 (six) hours as needed for wheezing or shortness of breath (cough, shortness of breath or wheezing.).    Dispense:  1 Inhaler    Refill:  5    INSURANCE WILL ONLY COVER VENTOLIN; PLEASE DISPENSE VENTOLIN.  Marland Kitchen EPINEPHrine (EPIPEN 2-PAK) 0.3 mg/0.3 mL IJ SOAJ injection    Sig: Inject 0.3 mLs (0.3 mg total) into the muscle once.    Dispense:  1 Device    Refill:  1    No Follow-up on file.   Kekoa Fyock Elayne Guerin, M.D. [facility] [address] [place], [place]  [id number] [phone] phone [phone] fax

## 2016-06-20 LAB — CBC WITH DIFFERENTIAL/PLATELET
BASOS: 1 %
Basophils Absolute: 0 10*3/uL (ref 0.0–0.2)
EOS (ABSOLUTE): 0.2 10*3/uL (ref 0.0–0.4)
EOS: 3 %
HEMATOCRIT: 44.1 % (ref 34.0–46.6)
HEMOGLOBIN: 14.9 g/dL (ref 11.1–15.9)
IMMATURE GRANULOCYTES: 1 %
Immature Grans (Abs): 0 10*3/uL (ref 0.0–0.1)
LYMPHS ABS: 1.7 10*3/uL (ref 0.7–3.1)
Lymphs: 25 %
MCH: 30.9 pg (ref 26.6–33.0)
MCHC: 33.8 g/dL (ref 31.5–35.7)
MCV: 92 fL (ref 79–97)
Monocytes Absolute: 0.5 10*3/uL (ref 0.1–0.9)
Monocytes: 7 %
Neutrophils Absolute: 4.5 10*3/uL (ref 1.4–7.0)
Neutrophils: 63 %
Platelets: 251 10*3/uL (ref 150–379)
RBC: 4.82 x10E6/uL (ref 3.77–5.28)
RDW: 13 % (ref 12.3–15.4)
WBC: 7 10*3/uL (ref 3.4–10.8)

## 2016-06-20 LAB — COMPREHENSIVE METABOLIC PANEL
A/G RATIO: 1.5 (ref 1.2–2.2)
ALBUMIN: 4.4 g/dL (ref 3.5–5.5)
ALT: 42 IU/L — ABNORMAL HIGH (ref 0–32)
AST: 28 IU/L (ref 0–40)
Alkaline Phosphatase: 91 IU/L (ref 39–117)
BUN / CREAT RATIO: 16 (ref 9–23)
BUN: 12 mg/dL (ref 6–24)
Bilirubin Total: 0.7 mg/dL (ref 0.0–1.2)
CO2: 22 mmol/L (ref 18–29)
CREATININE: 0.77 mg/dL (ref 0.57–1.00)
Calcium: 9.6 mg/dL (ref 8.7–10.2)
Chloride: 101 mmol/L (ref 96–106)
GFR, EST AFRICAN AMERICAN: 106 mL/min/{1.73_m2} (ref >59)
GFR, EST NON AFRICAN AMERICAN: 92 mL/min/{1.73_m2} (ref >59)
GLOBULIN, TOTAL: 2.9 g/dL (ref 1.5–4.5)
Glucose: 90 mg/dL (ref 65–99)
Potassium: 4 mmol/L (ref 3.5–5.2)
SODIUM: 143 mmol/L (ref 134–144)
Total Protein: 7.3 g/dL (ref 6.0–8.5)

## 2016-06-20 LAB — LIPID PANEL
CHOL/HDL RATIO: 3.7 ratio (ref 0.0–4.4)
Cholesterol, Total: 162 mg/dL (ref 100–199)
HDL: 44 mg/dL (ref >39)
LDL CALC: 99 mg/dL (ref 0–99)
Triglycerides: 94 mg/dL (ref 0–149)
VLDL Cholesterol Cal: 19 mg/dL (ref 5–40)

## 2016-06-20 LAB — TSH: TSH: 2.48 u[IU]/mL (ref 0.450–4.500)

## 2016-06-20 LAB — HEMOGLOBIN A1C
ESTIMATED AVERAGE GLUCOSE: 117 mg/dL
HEMOGLOBIN A1C: 5.7 % — AB (ref 4.8–5.6)

## 2016-06-20 LAB — HIV ANTIBODY (ROUTINE TESTING W REFLEX): HIV Screen 4th Generation wRfx: NONREACTIVE

## 2016-06-21 ENCOUNTER — Ambulatory Visit (INDEPENDENT_AMBULATORY_CARE_PROVIDER_SITE_OTHER): Payer: Medicare Other | Admitting: Family Medicine

## 2016-06-21 DIAGNOSIS — Z111 Encounter for screening for respiratory tuberculosis: Secondary | ICD-10-CM

## 2016-06-21 LAB — TB SKIN TEST
INDURATION: 0 mm
TB Skin Test: NEGATIVE

## 2016-06-24 ENCOUNTER — Encounter: Payer: Self-pay | Admitting: Family Medicine

## 2016-07-08 ENCOUNTER — Encounter: Payer: Self-pay | Admitting: Family Medicine

## 2016-07-08 NOTE — Progress Notes (Signed)
Tb skin test read only; no provider encounter.

## 2016-07-09 ENCOUNTER — Encounter: Payer: Self-pay | Admitting: Family Medicine

## 2016-09-24 DIAGNOSIS — M9902 Segmental and somatic dysfunction of thoracic region: Secondary | ICD-10-CM | POA: Diagnosis not present

## 2016-09-24 DIAGNOSIS — M9903 Segmental and somatic dysfunction of lumbar region: Secondary | ICD-10-CM | POA: Diagnosis not present

## 2016-09-24 DIAGNOSIS — M9901 Segmental and somatic dysfunction of cervical region: Secondary | ICD-10-CM | POA: Diagnosis not present

## 2016-09-24 DIAGNOSIS — M545 Low back pain: Secondary | ICD-10-CM | POA: Diagnosis not present

## 2016-09-25 DIAGNOSIS — M545 Low back pain: Secondary | ICD-10-CM | POA: Diagnosis not present

## 2016-09-25 DIAGNOSIS — M9903 Segmental and somatic dysfunction of lumbar region: Secondary | ICD-10-CM | POA: Diagnosis not present

## 2016-09-25 DIAGNOSIS — M9902 Segmental and somatic dysfunction of thoracic region: Secondary | ICD-10-CM | POA: Diagnosis not present

## 2016-09-25 DIAGNOSIS — M9901 Segmental and somatic dysfunction of cervical region: Secondary | ICD-10-CM | POA: Diagnosis not present

## 2016-10-03 DIAGNOSIS — M9903 Segmental and somatic dysfunction of lumbar region: Secondary | ICD-10-CM | POA: Diagnosis not present

## 2016-10-03 DIAGNOSIS — M9901 Segmental and somatic dysfunction of cervical region: Secondary | ICD-10-CM | POA: Diagnosis not present

## 2016-10-04 DIAGNOSIS — M9901 Segmental and somatic dysfunction of cervical region: Secondary | ICD-10-CM | POA: Diagnosis not present

## 2016-10-04 DIAGNOSIS — M9903 Segmental and somatic dysfunction of lumbar region: Secondary | ICD-10-CM | POA: Diagnosis not present

## 2016-10-05 DIAGNOSIS — M9903 Segmental and somatic dysfunction of lumbar region: Secondary | ICD-10-CM | POA: Diagnosis not present

## 2016-10-05 DIAGNOSIS — M9901 Segmental and somatic dysfunction of cervical region: Secondary | ICD-10-CM | POA: Diagnosis not present

## 2016-10-07 DIAGNOSIS — M9903 Segmental and somatic dysfunction of lumbar region: Secondary | ICD-10-CM | POA: Diagnosis not present

## 2016-10-07 DIAGNOSIS — M9901 Segmental and somatic dysfunction of cervical region: Secondary | ICD-10-CM | POA: Diagnosis not present

## 2016-10-08 DIAGNOSIS — M9903 Segmental and somatic dysfunction of lumbar region: Secondary | ICD-10-CM | POA: Diagnosis not present

## 2016-10-08 DIAGNOSIS — M9901 Segmental and somatic dysfunction of cervical region: Secondary | ICD-10-CM | POA: Diagnosis not present

## 2016-10-11 DIAGNOSIS — M9903 Segmental and somatic dysfunction of lumbar region: Secondary | ICD-10-CM | POA: Diagnosis not present

## 2016-10-11 DIAGNOSIS — M9901 Segmental and somatic dysfunction of cervical region: Secondary | ICD-10-CM | POA: Diagnosis not present

## 2016-10-14 DIAGNOSIS — M9903 Segmental and somatic dysfunction of lumbar region: Secondary | ICD-10-CM | POA: Diagnosis not present

## 2016-10-14 DIAGNOSIS — M9901 Segmental and somatic dysfunction of cervical region: Secondary | ICD-10-CM | POA: Diagnosis not present

## 2016-10-15 DIAGNOSIS — M9901 Segmental and somatic dysfunction of cervical region: Secondary | ICD-10-CM | POA: Diagnosis not present

## 2016-10-15 DIAGNOSIS — M9903 Segmental and somatic dysfunction of lumbar region: Secondary | ICD-10-CM | POA: Diagnosis not present

## 2016-10-16 DIAGNOSIS — M9901 Segmental and somatic dysfunction of cervical region: Secondary | ICD-10-CM | POA: Diagnosis not present

## 2016-10-16 DIAGNOSIS — M9903 Segmental and somatic dysfunction of lumbar region: Secondary | ICD-10-CM | POA: Diagnosis not present

## 2016-10-17 DIAGNOSIS — M9901 Segmental and somatic dysfunction of cervical region: Secondary | ICD-10-CM | POA: Diagnosis not present

## 2016-10-17 DIAGNOSIS — M9903 Segmental and somatic dysfunction of lumbar region: Secondary | ICD-10-CM | POA: Diagnosis not present

## 2016-10-18 DIAGNOSIS — M9903 Segmental and somatic dysfunction of lumbar region: Secondary | ICD-10-CM | POA: Diagnosis not present

## 2016-10-18 DIAGNOSIS — M9901 Segmental and somatic dysfunction of cervical region: Secondary | ICD-10-CM | POA: Diagnosis not present

## 2016-10-19 DIAGNOSIS — M9901 Segmental and somatic dysfunction of cervical region: Secondary | ICD-10-CM | POA: Diagnosis not present

## 2016-10-19 DIAGNOSIS — M9903 Segmental and somatic dysfunction of lumbar region: Secondary | ICD-10-CM | POA: Diagnosis not present

## 2016-10-21 DIAGNOSIS — M9903 Segmental and somatic dysfunction of lumbar region: Secondary | ICD-10-CM | POA: Diagnosis not present

## 2016-10-21 DIAGNOSIS — M9901 Segmental and somatic dysfunction of cervical region: Secondary | ICD-10-CM | POA: Diagnosis not present

## 2016-10-22 DIAGNOSIS — M9903 Segmental and somatic dysfunction of lumbar region: Secondary | ICD-10-CM | POA: Diagnosis not present

## 2016-10-22 DIAGNOSIS — M9901 Segmental and somatic dysfunction of cervical region: Secondary | ICD-10-CM | POA: Diagnosis not present

## 2016-10-23 DIAGNOSIS — M9903 Segmental and somatic dysfunction of lumbar region: Secondary | ICD-10-CM | POA: Diagnosis not present

## 2016-10-23 DIAGNOSIS — M9901 Segmental and somatic dysfunction of cervical region: Secondary | ICD-10-CM | POA: Diagnosis not present

## 2016-10-24 DIAGNOSIS — M9903 Segmental and somatic dysfunction of lumbar region: Secondary | ICD-10-CM | POA: Diagnosis not present

## 2016-10-24 DIAGNOSIS — M9901 Segmental and somatic dysfunction of cervical region: Secondary | ICD-10-CM | POA: Diagnosis not present

## 2016-10-28 DIAGNOSIS — M9901 Segmental and somatic dysfunction of cervical region: Secondary | ICD-10-CM | POA: Diagnosis not present

## 2016-10-28 DIAGNOSIS — M9903 Segmental and somatic dysfunction of lumbar region: Secondary | ICD-10-CM | POA: Diagnosis not present

## 2016-10-29 DIAGNOSIS — M9901 Segmental and somatic dysfunction of cervical region: Secondary | ICD-10-CM | POA: Diagnosis not present

## 2016-10-29 DIAGNOSIS — M9903 Segmental and somatic dysfunction of lumbar region: Secondary | ICD-10-CM | POA: Diagnosis not present

## 2016-11-08 DIAGNOSIS — M9903 Segmental and somatic dysfunction of lumbar region: Secondary | ICD-10-CM | POA: Diagnosis not present

## 2016-11-08 DIAGNOSIS — M9901 Segmental and somatic dysfunction of cervical region: Secondary | ICD-10-CM | POA: Diagnosis not present

## 2016-11-09 DIAGNOSIS — M9903 Segmental and somatic dysfunction of lumbar region: Secondary | ICD-10-CM | POA: Diagnosis not present

## 2016-11-09 DIAGNOSIS — M9901 Segmental and somatic dysfunction of cervical region: Secondary | ICD-10-CM | POA: Diagnosis not present

## 2016-11-11 DIAGNOSIS — M9901 Segmental and somatic dysfunction of cervical region: Secondary | ICD-10-CM | POA: Diagnosis not present

## 2016-11-11 DIAGNOSIS — M9903 Segmental and somatic dysfunction of lumbar region: Secondary | ICD-10-CM | POA: Diagnosis not present

## 2016-11-16 DIAGNOSIS — M9903 Segmental and somatic dysfunction of lumbar region: Secondary | ICD-10-CM | POA: Diagnosis not present

## 2016-11-16 DIAGNOSIS — M9901 Segmental and somatic dysfunction of cervical region: Secondary | ICD-10-CM | POA: Diagnosis not present

## 2016-11-27 DIAGNOSIS — M9901 Segmental and somatic dysfunction of cervical region: Secondary | ICD-10-CM | POA: Diagnosis not present

## 2016-11-27 DIAGNOSIS — M9903 Segmental and somatic dysfunction of lumbar region: Secondary | ICD-10-CM | POA: Diagnosis not present

## 2017-01-11 DIAGNOSIS — M9903 Segmental and somatic dysfunction of lumbar region: Secondary | ICD-10-CM | POA: Diagnosis not present

## 2017-01-11 DIAGNOSIS — M9901 Segmental and somatic dysfunction of cervical region: Secondary | ICD-10-CM | POA: Diagnosis not present

## 2017-03-13 DIAGNOSIS — M9901 Segmental and somatic dysfunction of cervical region: Secondary | ICD-10-CM | POA: Diagnosis not present

## 2017-03-13 DIAGNOSIS — M9903 Segmental and somatic dysfunction of lumbar region: Secondary | ICD-10-CM | POA: Diagnosis not present

## 2017-03-18 DIAGNOSIS — M9901 Segmental and somatic dysfunction of cervical region: Secondary | ICD-10-CM | POA: Diagnosis not present

## 2017-03-18 DIAGNOSIS — M9903 Segmental and somatic dysfunction of lumbar region: Secondary | ICD-10-CM | POA: Diagnosis not present

## 2017-03-19 DIAGNOSIS — M9901 Segmental and somatic dysfunction of cervical region: Secondary | ICD-10-CM | POA: Diagnosis not present

## 2017-03-19 DIAGNOSIS — M9903 Segmental and somatic dysfunction of lumbar region: Secondary | ICD-10-CM | POA: Diagnosis not present

## 2017-03-21 DIAGNOSIS — M9903 Segmental and somatic dysfunction of lumbar region: Secondary | ICD-10-CM | POA: Diagnosis not present

## 2017-03-21 DIAGNOSIS — M9901 Segmental and somatic dysfunction of cervical region: Secondary | ICD-10-CM | POA: Diagnosis not present

## 2017-03-27 DIAGNOSIS — M9903 Segmental and somatic dysfunction of lumbar region: Secondary | ICD-10-CM | POA: Diagnosis not present

## 2017-03-27 DIAGNOSIS — M9901 Segmental and somatic dysfunction of cervical region: Secondary | ICD-10-CM | POA: Diagnosis not present

## 2017-03-31 DIAGNOSIS — M9901 Segmental and somatic dysfunction of cervical region: Secondary | ICD-10-CM | POA: Diagnosis not present

## 2017-03-31 DIAGNOSIS — M9903 Segmental and somatic dysfunction of lumbar region: Secondary | ICD-10-CM | POA: Diagnosis not present

## 2017-04-02 DIAGNOSIS — M9903 Segmental and somatic dysfunction of lumbar region: Secondary | ICD-10-CM | POA: Diagnosis not present

## 2017-04-02 DIAGNOSIS — M9901 Segmental and somatic dysfunction of cervical region: Secondary | ICD-10-CM | POA: Diagnosis not present

## 2017-04-15 DIAGNOSIS — M9901 Segmental and somatic dysfunction of cervical region: Secondary | ICD-10-CM | POA: Diagnosis not present

## 2017-04-15 DIAGNOSIS — M9903 Segmental and somatic dysfunction of lumbar region: Secondary | ICD-10-CM | POA: Diagnosis not present

## 2017-04-28 DIAGNOSIS — M9903 Segmental and somatic dysfunction of lumbar region: Secondary | ICD-10-CM | POA: Diagnosis not present

## 2017-04-28 DIAGNOSIS — M9901 Segmental and somatic dysfunction of cervical region: Secondary | ICD-10-CM | POA: Diagnosis not present

## 2017-04-29 DIAGNOSIS — M9903 Segmental and somatic dysfunction of lumbar region: Secondary | ICD-10-CM | POA: Diagnosis not present

## 2017-04-29 DIAGNOSIS — M9901 Segmental and somatic dysfunction of cervical region: Secondary | ICD-10-CM | POA: Diagnosis not present

## 2017-05-13 DIAGNOSIS — N390 Urinary tract infection, site not specified: Secondary | ICD-10-CM | POA: Diagnosis not present

## 2017-05-13 DIAGNOSIS — R109 Unspecified abdominal pain: Secondary | ICD-10-CM | POA: Diagnosis not present

## 2017-05-13 DIAGNOSIS — K760 Fatty (change of) liver, not elsewhere classified: Secondary | ICD-10-CM | POA: Diagnosis not present

## 2017-06-16 ENCOUNTER — Ambulatory Visit: Payer: Medicare Other | Admitting: Family Medicine

## 2017-06-16 ENCOUNTER — Encounter: Payer: Self-pay | Admitting: Family Medicine

## 2017-06-16 DIAGNOSIS — R05 Cough: Secondary | ICD-10-CM | POA: Diagnosis not present

## 2017-06-16 DIAGNOSIS — I10 Essential (primary) hypertension: Secondary | ICD-10-CM | POA: Diagnosis not present

## 2017-06-16 DIAGNOSIS — R5383 Other fatigue: Secondary | ICD-10-CM | POA: Diagnosis not present

## 2017-06-16 DIAGNOSIS — J209 Acute bronchitis, unspecified: Secondary | ICD-10-CM | POA: Diagnosis not present

## 2017-06-20 ENCOUNTER — Other Ambulatory Visit: Payer: Self-pay

## 2017-06-20 ENCOUNTER — Encounter: Payer: Self-pay | Admitting: Family Medicine

## 2017-06-20 ENCOUNTER — Ambulatory Visit (INDEPENDENT_AMBULATORY_CARE_PROVIDER_SITE_OTHER): Payer: Medicare Other | Admitting: Family Medicine

## 2017-06-20 VITALS — BP 122/78 | HR 88 | Temp 98.6°F | Resp 16 | Ht 61.81 in | Wt 213.0 lb

## 2017-06-20 DIAGNOSIS — J320 Chronic maxillary sinusitis: Secondary | ICD-10-CM

## 2017-06-20 DIAGNOSIS — Z7251 High risk heterosexual behavior: Secondary | ICD-10-CM

## 2017-06-20 DIAGNOSIS — D72810 Lymphocytopenia: Secondary | ICD-10-CM | POA: Diagnosis not present

## 2017-06-20 DIAGNOSIS — J4541 Moderate persistent asthma with (acute) exacerbation: Secondary | ICD-10-CM

## 2017-06-20 DIAGNOSIS — R3989 Other symptoms and signs involving the genitourinary system: Secondary | ICD-10-CM

## 2017-06-20 DIAGNOSIS — E559 Vitamin D deficiency, unspecified: Secondary | ICD-10-CM

## 2017-06-20 DIAGNOSIS — F329 Major depressive disorder, single episode, unspecified: Secondary | ICD-10-CM

## 2017-06-20 DIAGNOSIS — F419 Anxiety disorder, unspecified: Secondary | ICD-10-CM | POA: Diagnosis not present

## 2017-06-20 DIAGNOSIS — T7421XA Adult sexual abuse, confirmed, initial encounter: Secondary | ICD-10-CM | POA: Diagnosis not present

## 2017-06-20 DIAGNOSIS — F4310 Post-traumatic stress disorder, unspecified: Secondary | ICD-10-CM | POA: Diagnosis not present

## 2017-06-20 LAB — POCT URINALYSIS DIP (MANUAL ENTRY)
BILIRUBIN UA: NEGATIVE
BILIRUBIN UA: NEGATIVE mg/dL
GLUCOSE UA: NEGATIVE mg/dL
LEUKOCYTES UA: NEGATIVE
NITRITE UA: NEGATIVE
Protein Ur, POC: NEGATIVE mg/dL
Spec Grav, UA: 1.025 (ref 1.010–1.025)
Urobilinogen, UA: 0.2 E.U./dL
pH, UA: 5 (ref 5.0–8.0)
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
## 2017-06-20 NOTE — Patient Instructions (Signed)
     IF you received an x-ray today, you will receive an invoice from [facility]. Please contact [facility] at [phone] with questions or concerns regarding your invoice.   IF you received labwork today, you will receive an invoice from [facility]. Please contact [facility] at [phone] with questions or concerns regarding your invoice.   Our billing staff will not be able to assist you with questions regarding bills from these companies.  You will be contacted with the lab results as soon as they are available. The fastest way to get your results is to activate your My Chart account. Instructions are located on the last page of this paperwork. If you have not heard from us regarding the results in 2 weeks, please contact this office.     

## 2017-06-21 LAB — COMPREHENSIVE METABOLIC PANEL
A/G RATIO: 1.8 (ref 1.2–2.2)
ALBUMIN: 4.9 g/dL (ref 3.5–5.5)
ALK PHOS: 106 IU/L (ref 39–117)
ALT: 43 IU/L — ABNORMAL HIGH (ref 0–32)
AST: 25 IU/L (ref 0–40)
BUN / CREAT RATIO: 12 (ref 9–23)
BUN: 9 mg/dL (ref 6–24)
Bilirubin Total: 0.6 mg/dL (ref 0.0–1.2)
CO2: 22 mmol/L (ref 20–29)
CREATININE: 0.75 mg/dL (ref 0.57–1.00)
Calcium: 9.7 mg/dL (ref 8.7–10.2)
Chloride: 102 mmol/L (ref 96–106)
GFR calc Af Amer: 108 mL/min/{1.73_m2} (ref >59)
GFR, EST NON AFRICAN AMERICAN: 94 mL/min/{1.73_m2} (ref >59)
GLOBULIN, TOTAL: 2.8 g/dL (ref 1.5–4.5)
Glucose: 106 mg/dL — ABNORMAL HIGH (ref 65–99)
Potassium: 4 mmol/L (ref 3.5–5.2)
SODIUM: 140 mmol/L (ref 134–144)
Total Protein: 7.7 g/dL (ref 6.0–8.5)

## 2017-06-21 LAB — URINALYSIS, MICROSCOPIC ONLY
BACTERIA UA: NONE SEEN
Casts: NONE SEEN /lpf
Epithelial Cells (non renal): >10 /hpf — AB (ref 0–10)

## 2017-06-21 LAB — CBC WITH DIFFERENTIAL/PLATELET
BASOS: 1 %
Basophils Absolute: 0.1 10*3/uL (ref 0.0–0.2)
EOS (ABSOLUTE): 0.2 10*3/uL (ref 0.0–0.4)
EOS: 3 %
HEMATOCRIT: 47.2 % — AB (ref 34.0–46.6)
HEMOGLOBIN: 16.3 g/dL — AB (ref 11.1–15.9)
Immature Grans (Abs): 0 10*3/uL (ref 0.0–0.1)
Immature Granulocytes: 1 %
LYMPHS ABS: 1.8 10*3/uL (ref 0.7–3.1)
Lymphs: 22 %
MCH: 31.3 pg (ref 26.6–33.0)
MCHC: 34.5 g/dL (ref 31.5–35.7)
MCV: 91 fL (ref 79–97)
MONOCYTES: 6 %
MONOS ABS: 0.5 10*3/uL (ref 0.1–0.9)
Neutrophils Absolute: 5.6 10*3/uL (ref 1.4–7.0)
Neutrophils: 67 %
Platelets: 305 10*3/uL (ref 150–379)
RBC: 5.21 x10E6/uL (ref 3.77–5.28)
RDW: 13.2 % (ref 12.3–15.4)
WBC: 8.3 10*3/uL (ref 3.4–10.8)

## 2017-06-21 LAB — URINE CULTURE

## 2017-06-21 LAB — HIV ANTIBODY (ROUTINE TESTING W REFLEX): HIV Screen 4th Generation wRfx: NONREACTIVE

## 2017-06-21 LAB — VITAMIN D 25 HYDROXY (VIT D DEFICIENCY, FRACTURES): Vit D, 25-Hydroxy: 12.8 ng/mL — ABNORMAL LOW (ref 30.0–100.0)

## 2017-06-25 ENCOUNTER — Ambulatory Visit: Payer: Self-pay | Admitting: Family Medicine

## 2017-06-28 ENCOUNTER — Encounter: Payer: Self-pay | Admitting: Family Medicine

## 2017-06-28 ENCOUNTER — Other Ambulatory Visit: Payer: Self-pay

## 2017-06-28 ENCOUNTER — Ambulatory Visit (INDEPENDENT_AMBULATORY_CARE_PROVIDER_SITE_OTHER): Payer: Medicare Other | Admitting: Family Medicine

## 2017-06-28 VITALS — BP 112/72 | HR 74 | Temp 97.6°F | Resp 20 | Ht 61.81 in | Wt 216.4 lb

## 2017-06-28 DIAGNOSIS — I10 Essential (primary) hypertension: Secondary | ICD-10-CM | POA: Diagnosis not present

## 2017-06-28 DIAGNOSIS — J4541 Moderate persistent asthma with (acute) exacerbation: Secondary | ICD-10-CM

## 2017-06-28 DIAGNOSIS — F4310 Post-traumatic stress disorder, unspecified: Secondary | ICD-10-CM

## 2017-06-28 DIAGNOSIS — T7421XD Adult sexual abuse, confirmed, subsequent encounter: Secondary | ICD-10-CM

## 2017-06-28 DIAGNOSIS — F430 Acute stress reaction: Secondary | ICD-10-CM | POA: Diagnosis not present

## 2017-06-28 DIAGNOSIS — Z23 Encounter for immunization: Secondary | ICD-10-CM

## 2017-06-28 DIAGNOSIS — E559 Vitamin D deficiency, unspecified: Secondary | ICD-10-CM | POA: Diagnosis not present

## 2017-06-28 MED ORDER — FLUTICASONE PROPIONATE HFA 220 MCG/ACT IN AERO: 2.0000 | INHALATION_SPRAY | Freq: Two times a day (BID) | RESPIRATORY_TRACT | 12 refills | Status: AC

## 2017-06-28 MED ORDER — VITAMIN D (ERGOCALCIFEROL) 1.25 MG (50000 UNIT) PO CAPS: 50000.0000 [IU] | ORAL_CAPSULE | ORAL | 0 refills | Status: DC

## 2017-06-28 MED ORDER — ALBUTEROL SULFATE HFA 108 (90 BASE) MCG/ACT IN AERS: 2.0000 | INHALATION_SPRAY | Freq: Four times a day (QID) | RESPIRATORY_TRACT | 5 refills | Status: AC | PRN

## 2017-06-28 MED ORDER — HYDROCHLOROTHIAZIDE 25 MG PO TABS: 25.0000 mg | ORAL_TABLET | Freq: Every day | ORAL | 1 refills | Status: DC
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
## 2017-06-28 NOTE — Patient Instructions (Signed)
     IF you received an x-ray today, you will receive an invoice from [facility]. Please contact [facility] at [phone] with questions or concerns regarding your invoice.   IF you received labwork today, you will receive an invoice from [facility]. Please contact [facility] at [phone] with questions or concerns regarding your invoice.   Our billing staff will not be able to assist you with questions regarding bills from these companies.  You will be contacted with the lab results as soon as they are available. The fastest way to get your results is to activate your My Chart account. Instructions are located on the last page of this paperwork. If you have not heard from us regarding the results in 2 weeks, please contact this office.     

## 2017-07-21 DIAGNOSIS — T7421XA Adult sexual abuse, confirmed, initial encounter: Secondary | ICD-10-CM | POA: Insufficient documentation

## 2017-07-21 DIAGNOSIS — F4310 Post-traumatic stress disorder, unspecified: Secondary | ICD-10-CM | POA: Insufficient documentation

## 2017-08-08 DIAGNOSIS — M9903 Segmental and somatic dysfunction of lumbar region: Secondary | ICD-10-CM | POA: Diagnosis not present

## 2017-08-08 DIAGNOSIS — M9901 Segmental and somatic dysfunction of cervical region: Secondary | ICD-10-CM | POA: Diagnosis not present

## 2017-08-09 DIAGNOSIS — M9903 Segmental and somatic dysfunction of lumbar region: Secondary | ICD-10-CM | POA: Diagnosis not present

## 2017-08-09 DIAGNOSIS — M9901 Segmental and somatic dysfunction of cervical region: Secondary | ICD-10-CM | POA: Diagnosis not present

## 2017-08-16 DIAGNOSIS — M25511 Pain in right shoulder: Secondary | ICD-10-CM | POA: Diagnosis not present

## 2017-08-16 DIAGNOSIS — M25561 Pain in right knee: Secondary | ICD-10-CM | POA: Diagnosis not present

## 2017-08-16 DIAGNOSIS — S43421A Sprain of right rotator cuff capsule, initial encounter: Secondary | ICD-10-CM | POA: Diagnosis not present

## 2017-08-18 DIAGNOSIS — M9903 Segmental and somatic dysfunction of lumbar region: Secondary | ICD-10-CM | POA: Diagnosis not present

## 2017-08-18 DIAGNOSIS — M9901 Segmental and somatic dysfunction of cervical region: Secondary | ICD-10-CM | POA: Diagnosis not present

## 2017-08-20 DIAGNOSIS — M25511 Pain in right shoulder: Secondary | ICD-10-CM | POA: Diagnosis not present

## 2017-08-25 DIAGNOSIS — M9903 Segmental and somatic dysfunction of lumbar region: Secondary | ICD-10-CM | POA: Diagnosis not present

## 2017-08-25 DIAGNOSIS — M25561 Pain in right knee: Secondary | ICD-10-CM | POA: Diagnosis not present

## 2017-08-25 DIAGNOSIS — S43421D Sprain of right rotator cuff capsule, subsequent encounter: Secondary | ICD-10-CM | POA: Diagnosis not present

## 2017-08-25 DIAGNOSIS — M9901 Segmental and somatic dysfunction of cervical region: Secondary | ICD-10-CM | POA: Diagnosis not present

## 2017-08-28 DIAGNOSIS — M9903 Segmental and somatic dysfunction of lumbar region: Secondary | ICD-10-CM | POA: Diagnosis not present

## 2017-08-28 DIAGNOSIS — M9901 Segmental and somatic dysfunction of cervical region: Secondary | ICD-10-CM | POA: Diagnosis not present

## 2017-09-02 DIAGNOSIS — M9903 Segmental and somatic dysfunction of lumbar region: Secondary | ICD-10-CM | POA: Diagnosis not present

## 2017-09-02 DIAGNOSIS — M9901 Segmental and somatic dysfunction of cervical region: Secondary | ICD-10-CM | POA: Diagnosis not present

## 2017-09-04 DIAGNOSIS — M9901 Segmental and somatic dysfunction of cervical region: Secondary | ICD-10-CM | POA: Diagnosis not present

## 2017-09-04 DIAGNOSIS — M9903 Segmental and somatic dysfunction of lumbar region: Secondary | ICD-10-CM | POA: Diagnosis not present

## 2017-09-05 DIAGNOSIS — M9901 Segmental and somatic dysfunction of cervical region: Secondary | ICD-10-CM | POA: Diagnosis not present

## 2017-09-05 DIAGNOSIS — M9903 Segmental and somatic dysfunction of lumbar region: Secondary | ICD-10-CM | POA: Diagnosis not present

## 2017-09-06 DIAGNOSIS — M9903 Segmental and somatic dysfunction of lumbar region: Secondary | ICD-10-CM | POA: Diagnosis not present

## 2017-09-06 DIAGNOSIS — M9901 Segmental and somatic dysfunction of cervical region: Secondary | ICD-10-CM | POA: Diagnosis not present

## 2017-09-08 DIAGNOSIS — M9903 Segmental and somatic dysfunction of lumbar region: Secondary | ICD-10-CM | POA: Diagnosis not present

## 2017-09-08 DIAGNOSIS — M9901 Segmental and somatic dysfunction of cervical region: Secondary | ICD-10-CM | POA: Diagnosis not present

## 2017-09-09 DIAGNOSIS — M9901 Segmental and somatic dysfunction of cervical region: Secondary | ICD-10-CM | POA: Diagnosis not present

## 2017-09-09 DIAGNOSIS — M9903 Segmental and somatic dysfunction of lumbar region: Secondary | ICD-10-CM | POA: Diagnosis not present

## 2017-09-11 DIAGNOSIS — F4310 Post-traumatic stress disorder, unspecified: Secondary | ICD-10-CM | POA: Diagnosis not present

## 2017-09-11 DIAGNOSIS — F900 Attention-deficit hyperactivity disorder, predominantly inattentive type: Secondary | ICD-10-CM | POA: Diagnosis not present

## 2017-09-15 DIAGNOSIS — M9901 Segmental and somatic dysfunction of cervical region: Secondary | ICD-10-CM | POA: Diagnosis not present

## 2017-09-15 DIAGNOSIS — M9903 Segmental and somatic dysfunction of lumbar region: Secondary | ICD-10-CM | POA: Diagnosis not present

## 2017-09-17 DIAGNOSIS — M9901 Segmental and somatic dysfunction of cervical region: Secondary | ICD-10-CM | POA: Diagnosis not present

## 2017-09-17 DIAGNOSIS — M9903 Segmental and somatic dysfunction of lumbar region: Secondary | ICD-10-CM | POA: Diagnosis not present

## 2017-09-26 DIAGNOSIS — M9901 Segmental and somatic dysfunction of cervical region: Secondary | ICD-10-CM | POA: Diagnosis not present

## 2017-09-26 DIAGNOSIS — M9903 Segmental and somatic dysfunction of lumbar region: Secondary | ICD-10-CM | POA: Diagnosis not present

## 2017-09-28 ENCOUNTER — Other Ambulatory Visit: Payer: Self-pay | Admitting: Family Medicine

## 2017-09-29 ENCOUNTER — Encounter: Payer: Self-pay | Admitting: Family Medicine

## 2017-09-29 NOTE — Telephone Encounter (Signed)
This encounter was created in error - please disregard.

## 2017-09-29 NOTE — Telephone Encounter (Signed)
Patient called [phone], left VM to call the office back to schedule the Vit D lab. This was noted in the last OV for 3 month Vit D, lab order already entered.

## 2017-09-30 NOTE — Telephone Encounter (Signed)
Patient called, left VM to return call to the office to schedule a lab appointment for Vit D Level.

## 2017-10-09 DIAGNOSIS — M9903 Segmental and somatic dysfunction of lumbar region: Secondary | ICD-10-CM | POA: Diagnosis not present

## 2017-10-09 DIAGNOSIS — M9901 Segmental and somatic dysfunction of cervical region: Secondary | ICD-10-CM | POA: Diagnosis not present

## 2017-10-15 ENCOUNTER — Ambulatory Visit: Payer: Self-pay | Admitting: Family Medicine

## 2017-10-20 DIAGNOSIS — M65331 Trigger finger, right middle finger: Secondary | ICD-10-CM | POA: Diagnosis not present

## 2017-10-22 ENCOUNTER — Telehealth: Payer: Self-pay | Admitting: Family Medicine

## 2017-10-22 NOTE — Telephone Encounter (Signed)
Copied from [place] [id number]. Topic: Quick Communication - See Telephone Encounter >> Oct 22, 2017  4:40 PM Vernona Rieger wrote: CRM for notification. See Telephone encounter for: 10/22/17.  Patient said that she is currently in [place] has to have a finger procedure done. She went to an urgent care to try and get surgical clearance & they would not see her without some kind of statement. She needs a statement sent to [facility] phone # [phone]. The fax number is [phone]. She needs it to state that she is under [facility] care & has been seen for sleep apnea & high blood pressure, as well as she has been taking her medication & been compliant. She can be reached at [phone] if you have any questions. She will be coming in 5/8 to see Krisit.

## 2017-10-23 NOTE — Telephone Encounter (Signed)
Please advise. Unsure whether pt has been adhering to medication regimen.

## 2017-10-23 NOTE — Telephone Encounter (Signed)
Please see below.

## 2017-10-23 NOTE — Telephone Encounter (Signed)
Medical records does not write letters like this.  Please forward to someone who is authorized and qualified to give out this type of information.  Thanks.

## 2017-10-23 NOTE — Telephone Encounter (Signed)
Called [facility] in [place] for clarification of request.Spoke with provider at Urgent Care who evaluated patient; curious of baseline blood pressures.   BP 182/82 at urgent care.  Surgeons needing clarification; [phone].  Left message with orthopedist regarding baseline blood pressures of 112-120/70-84.  Provider returned call and stated that BP was an error and was actually was 112/82.  Medical clearance provided by Urgent Care provider.

## 2017-11-12 ENCOUNTER — Encounter: Payer: Self-pay | Admitting: Family Medicine

## 2017-11-12 ENCOUNTER — Other Ambulatory Visit: Payer: Self-pay

## 2017-11-12 ENCOUNTER — Ambulatory Visit (INDEPENDENT_AMBULATORY_CARE_PROVIDER_SITE_OTHER): Payer: Medicare Other | Admitting: Family Medicine

## 2017-11-12 VITALS — BP 108/82 | HR 93 | Temp 98.0°F | Resp 16 | Ht 61.81 in | Wt 218.0 lb

## 2017-11-12 DIAGNOSIS — L603 Nail dystrophy: Secondary | ICD-10-CM

## 2017-11-12 DIAGNOSIS — R739 Hyperglycemia, unspecified: Secondary | ICD-10-CM | POA: Diagnosis not present

## 2017-11-12 DIAGNOSIS — S61411A Laceration without foreign body of right hand, initial encounter: Secondary | ICD-10-CM | POA: Diagnosis not present

## 2017-11-12 DIAGNOSIS — E7800 Pure hypercholesterolemia, unspecified: Secondary | ICD-10-CM

## 2017-11-12 DIAGNOSIS — E559 Vitamin D deficiency, unspecified: Secondary | ICD-10-CM

## 2017-11-12 DIAGNOSIS — H6611 Chronic tubotympanic suppurative otitis media, right ear: Secondary | ICD-10-CM

## 2017-11-12 DIAGNOSIS — I10 Essential (primary) hypertension: Secondary | ICD-10-CM

## 2017-11-12 DIAGNOSIS — T7421XD Adult sexual abuse, confirmed, subsequent encounter: Secondary | ICD-10-CM

## 2017-11-12 DIAGNOSIS — L719 Rosacea, unspecified: Secondary | ICD-10-CM

## 2017-11-12 MED ORDER — DOXYCYCLINE HYCLATE 100 MG PO TABS
100.0000 mg | ORAL_TABLET | Freq: Two times a day (BID) | ORAL | 5 refills | Status: DC
Start: 2017-11-12 — End: 2018-02-04

## 2017-11-12 NOTE — Patient Instructions (Signed)
     IF you received an x-ray today, you will receive an invoice from [facility]. Please contact [facility] at [phone] with questions or concerns regarding your invoice.   IF you received labwork today, you will receive an invoice from [facility]. Please contact [facility] at [phone] with questions or concerns regarding your invoice.   Our billing staff will not be able to assist you with questions regarding bills from these companies.  You will be contacted with the lab results as soon as they are available. The fastest way to get your results is to activate your My Chart account. Instructions are located on the last page of this paperwork. If you have not heard from us regarding the results in 2 weeks, please contact this office.     
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
## 2017-11-13 LAB — CBC WITH DIFFERENTIAL/PLATELET
BASOS ABS: 0.1 10*3/uL (ref 0.0–0.2)
Basos: 1 %
EOS (ABSOLUTE): 0.3 10*3/uL (ref 0.0–0.4)
Eos: 3 %
HEMOGLOBIN: 14.2 g/dL (ref 11.1–15.9)
Hematocrit: 42.5 % (ref 34.0–46.6)
Immature Grans (Abs): 0 10*3/uL (ref 0.0–0.1)
Immature Granulocytes: 0 %
Lymphocytes Absolute: 2.6 10*3/uL (ref 0.7–3.1)
Lymphs: 31 %
MCH: 30.3 pg (ref 26.6–33.0)
MCHC: 33.4 g/dL (ref 31.5–35.7)
MCV: 91 fL (ref 79–97)
MONOS ABS: 0.5 10*3/uL (ref 0.1–0.9)
Monocytes: 6 %
NEUTROS ABS: 5.1 10*3/uL (ref 1.4–7.0)
Neutrophils: 59 %
Platelets: 282 10*3/uL (ref 150–379)
RBC: 4.69 x10E6/uL (ref 3.77–5.28)
RDW: 13 % (ref 12.3–15.4)
WBC: 8.6 10*3/uL (ref 3.4–10.8)

## 2017-11-13 LAB — COMPREHENSIVE METABOLIC PANEL
A/G RATIO: 1.5 (ref 1.2–2.2)
ALT: 46 IU/L — AB (ref 0–32)
AST: 31 IU/L (ref 0–40)
Albumin: 4.1 g/dL (ref 3.5–5.5)
Alkaline Phosphatase: 90 IU/L (ref 39–117)
BILIRUBIN TOTAL: 0.4 mg/dL (ref 0.0–1.2)
BUN/Creatinine Ratio: 16 (ref 9–23)
BUN: 12 mg/dL (ref 6–24)
CHLORIDE: 106 mmol/L (ref 96–106)
CO2: 21 mmol/L (ref 20–29)
Calcium: 8.9 mg/dL (ref 8.7–10.2)
Creatinine, Ser: 0.76 mg/dL (ref 0.57–1.00)
GFR calc non Af Amer: 92 mL/min/{1.73_m2} (ref >59)
GFR, EST AFRICAN AMERICAN: 106 mL/min/{1.73_m2} (ref >59)
GLUCOSE: 117 mg/dL — AB (ref 65–99)
Globulin, Total: 2.7 g/dL (ref 1.5–4.5)
POTASSIUM: 4.2 mmol/L (ref 3.5–5.2)
Sodium: 144 mmol/L (ref 134–144)
TOTAL PROTEIN: 6.8 g/dL (ref 6.0–8.5)

## 2017-11-13 LAB — LIPID PANEL
CHOL/HDL RATIO: 4 ratio (ref 0.0–4.4)
CHOLESTEROL TOTAL: 137 mg/dL (ref 100–199)
HDL: 34 mg/dL — ABNORMAL LOW (ref >39)
LDL CALC: 78 mg/dL (ref 0–99)
Triglycerides: 125 mg/dL (ref 0–149)
VLDL CHOLESTEROL CAL: 25 mg/dL (ref 5–40)

## 2017-11-13 LAB — VITAMIN D 25 HYDROXY (VIT D DEFICIENCY, FRACTURES): VIT D 25 HYDROXY: 14.8 ng/mL — AB (ref 30.0–100.0)

## 2017-11-13 LAB — HEMOGLOBIN A1C
Est. average glucose Bld gHb Est-mCnc: 120 mg/dL
HEMOGLOBIN A1C: 5.8 % — AB (ref 4.8–5.6)

## 2017-11-19 ENCOUNTER — Ambulatory Visit (INDEPENDENT_AMBULATORY_CARE_PROVIDER_SITE_OTHER): Payer: Medicare Other | Admitting: Family Medicine

## 2017-11-19 DIAGNOSIS — S61411A Laceration without foreign body of right hand, initial encounter: Secondary | ICD-10-CM | POA: Diagnosis not present

## 2017-11-19 NOTE — Progress Notes (Signed)
Subjective:    Patient ID: [name], female    DOB: Jan 16, 1966, 52 y.o.   MRN: [id number]  11/19/2017  Suture / Staple Removal    HPI This 52 y.o. female presents for evaluation of remaining suture in surgical wound.  Can see remaining end of suture yet unable to remove on own.  Denies fever, chills, sweats. Denies drainage.   BP Readings from Last 3 Encounters:  11/28/17 132/86  11/12/17 108/82  06/28/17 112/72   Wt Readings from Last 3 Encounters:  11/28/17 218 lb (98.9 kg)  11/12/17 218 lb (98.9 kg)  06/28/17 216 lb 6.4 oz (98.2 kg)   Immunization History  Administered Date(s) Administered  . Influenza Split 04/16/2012, 04/08/2015  . Influenza Whole 05/08/2009  . Influenza,inj,Quad PF,6+ Mos 06/23/2013, 05/23/2014, 04/24/2016, 06/28/2017  . Influenza-Unspecified 06/28/2017  . PPD Test 06/19/2016  . Pneumococcal Polysaccharide-23 05/08/2009  . Td 07/23/2010  . Tdap 07/08/2008    Review of Systems  Constitutional: Negative for chills, diaphoresis, fatigue and fever.  HENT: Negative for ear pain, postnasal drip, rhinorrhea, sinus pressure, sore throat and trouble swallowing.   Respiratory: Negative for cough and shortness of breath.   Cardiovascular: Negative for chest pain, palpitations and leg swelling.  Gastrointestinal: Negative for abdominal pain, constipation, diarrhea, nausea and vomiting.    Past Medical History:  Diagnosis Date  . Allergic rhinitis, cause unspecified   . Anxiety state, unspecified   . Apnea   . Attention deficit disorder with hyperactivity(314.01)   . Benign neoplasm of colon   . Calculus of kidney   . Celiac sprue    biopsy of small intenstin  . DVT (deep venous thrombosis) ([place]) 2002   left leg  . Eating disorder, unspecified   . Genital herpes, unspecified   . GERD (gastroesophageal reflux disease)   . Heart murmur   . Hypertension   . IBS (irritable bowel syndrome)   . Multiple sclerosis ([place])   . Optic neuritis,  unspecified    both eye  . Other chronic allergic conjunctivitis   . Rosacea   . Tachycardia, unspecified   . Tremor   . Unspecified asthma(493.90)    Past Surgical History:  Procedure Laterality Date  . ABDOMINAL HYSTERECTOMY  2008  . APPENDECTOMY  2005  . CARDIAC CATHETERIZATION  09/2009  . CHOLECYSTECTOMY  2002  . nevus resection  06/2010   R upper back; L anterior chest 08/2011.  Marland Kitchen ROTATOR CUFF REPAIR  1996   Right  . TONSILECTOMY, ADENOIDECTOMY, BILATERAL MYRINGOTOMY AND TUBES  1987  . TRIGGER FINGer Surgery     Allergies  Allergen Reactions  . Codeine     REACTION: hallucinations  . Erythromycin     REACTION: seizures  . Levofloxacin     REACTION: hallucinations  . Oxycodone Hcl     REACTION: passed out  . Telithromycin Rash    Generic for Ketek   Current Outpatient Medications on File Prior to Visit  Medication Sig Dispense Refill  . albuterol (PROVENTIL HFA;VENTOLIN HFA) 108 (90 Base) MCG/ACT inhaler Inhale 2 puffs into the lungs every 6 (six) hours as needed for wheezing or shortness of breath (cough, shortness of breath or wheezing.). 1 Inhaler 5  . amphetamine-dextroamphetamine (ADDERALL XR) 25 MG 24 hr capsule Take 1 capsule by mouth every morning. Do not fill until 11-19-15 30 capsule 0  . amphetamine-dextroamphetamine (ADDERALL) 5 MG tablet Take 1 tablet (5 mg total) by mouth daily. 30 tablet 0  . cetirizine (ZYRTEC) 10  MG tablet Take 1 tablet (10 mg total) by mouth daily. 90 tablet 3  . desonide (DESOWEN) 0.05 % lotion Apply topically 2 (two) times daily. 59 mL 1  . dexlansoprazole (DEXILANT) 60 MG capsule Take 1 capsule (60 mg total) by mouth 2 (two) times daily. 180 capsule 3  . doxycycline (VIBRA-TABS) 100 MG tablet Take 1 tablet (100 mg total) by mouth 2 (two) times daily. 60 tablet 5  . FLUoxetine (PROZAC) 20 MG tablet Take 2 tablets (40 mg total) by mouth daily. (Patient taking differently: Take 40 mg by mouth 3 (three) times daily. ) 180 tablet 1  .  fluticasone (FLOVENT HFA) 220 MCG/ACT inhaler Inhale 2 puffs into the lungs 2 (two) times daily. 1 Inhaler 12  . gabapentin (NEURONTIN) 100 MG capsule Take 1-2 capsules (100-200 mg total) by mouth 3 (three) times daily. 180 capsule 3  . hydrochlorothiazide (HYDRODIURIL) 25 MG tablet Take 1 tablet (25 mg total) by mouth daily. 90 tablet 1  . LORazepam (ATIVAN) 0.5 MG tablet TAKE 1 TABLET BY MOUTH TWICE A DAY AS NEEDED FOR ANXIETY (Patient taking differently: TAKE 1 TABLET BY MOUTH THREE A DAY AS NEEDED FOR ANXIETY) 30 tablet 2  . meloxicam (MOBIC) 15 MG tablet Take 1 tablet (15 mg total) by mouth daily. 30 tablet 2  . metoprolol succinate (TOPROL-XL) 25 MG 24 hr tablet Take 1-2 tablets (25-50 mg total) by mouth daily. 180 tablet 1  . nitroGLYCERIN (NITROSTAT) 0.4 MG SL tablet Place 1 tablet (0.4 mg total) under the tongue every 5 (five) minutes as needed for chest pain. NEED REFILLS 30 tablet 1  . prazosin (MINIPRESS) 2 MG capsule Take by mouth daily.  3  . triamcinolone ointment (KENALOG) 0.1 % Apply topically 2 (two) times daily. Apply to hands 30 g 1  . valACYclovir (VALTREX) 500 MG tablet Take 1 tablet (500 mg total) by mouth 2 (two) times daily. 180 tablet 3  . Vitamin D, Ergocalciferol, (DRISDOL) 50000 units CAPS capsule Take 1 capsule (50,000 Units total) by mouth every 7 (seven) days. 12 capsule 0   No current facility-administered medications on file prior to visit.    Social History   Socioeconomic History  . Marital status: Divorced    Spouse name: Not on file  . Number of children: Not on file  . Years of education: college  . Highest education level: Not on file  Occupational History  . Occupation: disabled    Comment: due to [place]  . Occupation: full time student    Comment: Nursing  Social Needs  . Financial resource strain: Not on file  . Food insecurity:    Worry: Not on file    Inability: Not on file  . Transportation needs:    Medical: Not on file    Non-medical: Not  on file  Tobacco Use  . Smoking status: Never Smoker  . Smokeless tobacco: Never Used  Substance and Sexual Activity  . Alcohol use: No  . Drug use: No  . Sexual activity: Not Currently    Birth control/protection: None, Abstinence  Lifestyle  . Physical activity:    Days per week: Not on file    Minutes per session: Not on file  . Stress: Not on file  Relationships  . Social connections:    Talks on phone: Not on file    Gets together: Not on file    Attends religious service: Not on file    Active member of club or organization: Not  on file    Attends meetings of clubs or organizations: Not on file    Relationship status: Not on file  . Intimate partner violence:    Fear of current or ex partner: Not on file    Emotionally abused: Not on file    Physically abused: Not on file    Forced sexual activity: Not on file  Other Topics Concern  . Not on file  Social History Narrative   Divorced since 1998 after 1 year of marriage, first marriage. Caffeine use: carbonated beverage about 4 serving / day. Lives with best friend Collie Siad, son, best friend;s two sons in house.. Always uses seat belts. Exercise: Inactive. She graduated from [employer].   Family History  Problem Relation Age of Onset  . Alcohol abuse Mother   . Diabetes Mother   . Coronary artery disease Mother        Carotid Artery DIsease  . Hypertension Mother   . Diabetes Father   . Hypertension Father   . Kidney Stones Son   . Celiac disease Son   . Colon cancer Unknown   . Cancer Neg Hx        gyn       Objective:    There were no vitals taken for this visit. Physical Exam  Constitutional: She is oriented to person, place, and time. She appears well-developed and well-nourished. No distress.  HENT:  Head: Normocephalic and atraumatic.  Eyes: Pupils are equal, round, and reactive to light. Conjunctivae are normal.  Neck: Normal range of motion. Neck supple.  Cardiovascular: Normal rate, regular  rhythm and normal heart sounds. Exam reveals no gallop and no friction rub.  No murmur heard. Pulmonary/Chest: Effort normal and breath sounds normal. She has no wheezes. She has no rales.  Neurological: She is alert and oriented to person, place, and time.  Skin: She is not diaphoretic.  Suture remnant removed from surgical wound without resistance.  No associated erythema, drainage, or tenderness.  Wound well approximated.  Psychiatric: She has a normal mood and affect. Her behavior is normal.  Nursing note and vitals reviewed.  No results found. Depression screen [facility] 2/9 11/12/2017 06/28/2017 06/20/2017 06/19/2016 07/13/2015  Decreased Interest 0 2 2 0 0  Down, Depressed, Hopeless 0 1 2 0 0  PHQ - 2 Score 0 3 4 0 0  Altered sleeping - 2 2 - -  Tired, decreased energy - 2 2 - -  Change in appetite - 1 2 - -  Feeling bad or failure about yourself  - 1 2 - -  Trouble concentrating - 1 2 - -  Moving slowly or fidgety/restless - 0 2 - -  Suicidal thoughts - 0 2 - -  PHQ-9 Score - 10 18 - -  Difficult doing work/chores - Somewhat difficult - - -   Fall Risk  11/12/2017 06/28/2017 06/20/2017 06/19/2016  Falls in the past year? No No No No        Assessment & Plan:   1. Laceration of right hand without foreign body, initial encounter     With incomplete/unsuccessful removal of embedded suture last week; successful removal of suture remnant today.  No secondary infection.  Local wound care reviewed with patient.  No orders of the defined types were placed in this encounter.  No orders of the defined types were placed in this encounter.   No follow-ups on file.   Kristi Elayne Guerin, M.D. Primary Care at [facility] previously Ur[facility] [place] [address] [place], [place]  [address] [phone] phone [phone] fax

## 2017-11-23 ENCOUNTER — Encounter: Payer: Self-pay | Admitting: Family Medicine

## 2017-11-27 ENCOUNTER — Encounter: Payer: Self-pay | Admitting: Family Medicine

## 2017-11-28 ENCOUNTER — Encounter: Payer: Self-pay | Admitting: Family Medicine

## 2017-11-28 ENCOUNTER — Other Ambulatory Visit: Payer: Self-pay

## 2017-11-28 ENCOUNTER — Ambulatory Visit (INDEPENDENT_AMBULATORY_CARE_PROVIDER_SITE_OTHER): Payer: Medicare Other | Admitting: Family Medicine

## 2017-11-28 VITALS — BP 132/86 | HR 79 | Temp 98.0°F | Resp 16 | Ht 61.0 in | Wt 218.0 lb

## 2017-11-28 DIAGNOSIS — R42 Dizziness and giddiness: Secondary | ICD-10-CM

## 2017-11-28 DIAGNOSIS — R21 Rash and other nonspecific skin eruption: Secondary | ICD-10-CM

## 2017-11-28 MED ORDER — MECLIZINE HCL 25 MG PO TABS: 25.0000 mg | ORAL_TABLET | Freq: Three times a day (TID) | ORAL | 0 refills | Status: AC | PRN

## 2017-11-28 MED ORDER — PREDNISONE 20 MG PO TABS: ORAL_TABLET | ORAL | 0 refills | Status: AC

## 2017-11-28 NOTE — Progress Notes (Signed)
Venipuncture attempted x1 left AC unsuccessful. Venipuncture attempted right AC x1 successful.

## 2017-11-28 NOTE — Progress Notes (Signed)
Subjective:    Patient ID: [name], female    DOB: 11-Jan-1966, 52 y.o.   MRN: [id number]  11/28/2017  Herpes Zoster (pt state she may have shingles on the back of her neck. Pt states she noticed it on Sunday ) and Dizziness (since she noticed the Rash )    HPI This 52 y.o. female presents for evaluation of scalp rash.  Similar symptoms to previous shingles rash. Having dizziness moderate with rash along posterior neck. Has Gabapentin. Denies fever yet feels malaise/fatigue; +chills/sweats. +numbness and tingling at site of rash.  +significant scalp sensitivity. No headache; no hearing loss; no tinnitus; no vision changes or diplopia; no focal weakness.  No chest pain or palpitations or SOB/DOE.  Dystrophic nail: having thickened nails.  BP Readings from Last 3 Encounters:  11/28/17 132/86  11/12/17 108/82  06/28/17 112/72   Wt Readings from Last 3 Encounters:  11/28/17 218 lb (98.9 kg)  11/12/17 218 lb (98.9 kg)  06/28/17 216 lb 6.4 oz (98.2 kg)   Immunization History  Administered Date(s) Administered  . Influenza Split 04/16/2012, 04/08/2015  . Influenza Whole 05/08/2009  . Influenza,inj,Quad PF,6+ Mos 06/23/2013, 05/23/2014, 04/24/2016, 06/28/2017  . Influenza-Unspecified 06/28/2017  . PPD Test 06/19/2016  . Pneumococcal Polysaccharide-23 05/08/2009  . Td 07/23/2010  . Tdap 07/08/2008    Review of Systems  Constitutional: Negative for activity change, appetite change, chills, diaphoresis, fatigue, fever and unexpected weight change.  HENT: Negative for congestion, dental problem, drooling, ear discharge, ear pain, facial swelling, hearing loss, mouth sores, nosebleeds, postnasal drip, rhinorrhea, sinus pressure, sneezing, sore throat, tinnitus, trouble swallowing and voice change.   Eyes: Negative for photophobia, pain, discharge, redness, itching and visual disturbance.  Respiratory: Negative for apnea, cough, choking, chest tightness, shortness of breath,  wheezing and stridor.   Cardiovascular: Negative for chest pain, palpitations and leg swelling.  Gastrointestinal: Negative for abdominal distention, abdominal pain, anal bleeding, blood in stool, constipation, diarrhea, nausea, rectal pain and vomiting.  Endocrine: Negative for cold intolerance, heat intolerance, polydipsia, polyphagia and polyuria.  Genitourinary: Negative for decreased urine volume, difficulty urinating, dyspareunia, dysuria, enuresis, flank pain, frequency, genital sores, hematuria, menstrual problem, pelvic pain, urgency, vaginal bleeding, vaginal discharge and vaginal pain.  Musculoskeletal: Negative for arthralgias, back pain, gait problem, joint swelling, myalgias, neck pain and neck stiffness.  Skin: Positive for rash. Negative for color change, pallor and wound.  Allergic/Immunologic: Negative for environmental allergies, food allergies and immunocompromised state.  Neurological: Positive for dizziness and numbness. Negative for tremors, seizures, syncope, facial asymmetry, speech difficulty, weakness, light-headedness and headaches.  Hematological: Negative for adenopathy. Does not bruise/bleed easily.  Psychiatric/Behavioral: Negative for agitation, behavioral problems, confusion, decreased concentration, dysphoric mood, hallucinations, self-injury, sleep disturbance and suicidal ideas. The patient is not nervous/anxious and is not hyperactive.     Past Medical History:  Diagnosis Date  . Allergic rhinitis, cause unspecified   . Anxiety state, unspecified   . Apnea   . Attention deficit disorder with hyperactivity(314.01)   . Benign neoplasm of colon   . Calculus of kidney   . Celiac sprue    biopsy of small intenstin  . DVT (deep venous thrombosis) ([place]) 2002   left leg  . Eating disorder, unspecified   . Genital herpes, unspecified   . GERD (gastroesophageal reflux disease)   . Heart murmur   . Hypertension   . IBS (irritable bowel syndrome)   . Multiple  sclerosis ([place])   . Optic neuritis, unspecified  both eye  . Other chronic allergic conjunctivitis   . Rosacea   . Tachycardia, unspecified   . Tremor   . Unspecified asthma(493.90)    Past Surgical History:  Procedure Laterality Date  . ABDOMINAL HYSTERECTOMY  2008  . APPENDECTOMY  2005  . CARDIAC CATHETERIZATION  09/2009  . CHOLECYSTECTOMY  2002  . nevus resection  06/2010   R upper back; L anterior chest 08/2011.  Marland Kitchen ROTATOR CUFF REPAIR  1996   Right  . TONSILECTOMY, ADENOIDECTOMY, BILATERAL MYRINGOTOMY AND TUBES  1987  . TRIGGER FINGer Surgery     Allergies  Allergen Reactions  . Codeine     REACTION: hallucinations  . Erythromycin     REACTION: seizures  . Levofloxacin     REACTION: hallucinations  . Oxycodone Hcl     REACTION: passed out  . Telithromycin Rash    Generic for Ketek   Current Outpatient Medications on File Prior to Visit  Medication Sig Dispense Refill  . albuterol (PROVENTIL HFA;VENTOLIN HFA) 108 (90 Base) MCG/ACT inhaler Inhale 2 puffs into the lungs every 6 (six) hours as needed for wheezing or shortness of breath (cough, shortness of breath or wheezing.). 1 Inhaler 5  . amphetamine-dextroamphetamine (ADDERALL XR) 25 MG 24 hr capsule Take 1 capsule by mouth every morning. Do not fill until 11-19-15 30 capsule 0  . amphetamine-dextroamphetamine (ADDERALL) 5 MG tablet Take 1 tablet (5 mg total) by mouth daily. 30 tablet 0  . cetirizine (ZYRTEC) 10 MG tablet Take 1 tablet (10 mg total) by mouth daily. 90 tablet 3  . desonide (DESOWEN) 0.05 % lotion Apply topically 2 (two) times daily. 59 mL 1  . dexlansoprazole (DEXILANT) 60 MG capsule Take 1 capsule (60 mg total) by mouth 2 (two) times daily. 180 capsule 3  . doxycycline (VIBRA-TABS) 100 MG tablet Take 1 tablet (100 mg total) by mouth 2 (two) times daily. 60 tablet 5  . FLUoxetine (PROZAC) 20 MG tablet Take 2 tablets (40 mg total) by mouth daily. (Patient taking differently: Take 40 mg by mouth 3  (three) times daily. ) 180 tablet 1  . fluticasone (FLOVENT HFA) 220 MCG/ACT inhaler Inhale 2 puffs into the lungs 2 (two) times daily. 1 Inhaler 12  . gabapentin (NEURONTIN) 100 MG capsule Take 1-2 capsules (100-200 mg total) by mouth 3 (three) times daily. 180 capsule 3  . hydrochlorothiazide (HYDRODIURIL) 25 MG tablet Take 1 tablet (25 mg total) by mouth daily. 90 tablet 1  . LORazepam (ATIVAN) 0.5 MG tablet TAKE 1 TABLET BY MOUTH TWICE A DAY AS NEEDED FOR ANXIETY (Patient taking differently: TAKE 1 TABLET BY MOUTH THREE A DAY AS NEEDED FOR ANXIETY) 30 tablet 2  . meloxicam (MOBIC) 15 MG tablet Take 1 tablet (15 mg total) by mouth daily. 30 tablet 2  . metoprolol succinate (TOPROL-XL) 25 MG 24 hr tablet Take 1-2 tablets (25-50 mg total) by mouth daily. 180 tablet 1  . prazosin (MINIPRESS) 2 MG capsule Take by mouth daily.  3  . triamcinolone ointment (KENALOG) 0.1 % Apply topically 2 (two) times daily. Apply to hands 30 g 1  . valACYclovir (VALTREX) 500 MG tablet Take 1 tablet (500 mg total) by mouth 2 (two) times daily. 180 tablet 3  . Vitamin D, Ergocalciferol, (DRISDOL) 50000 units CAPS capsule Take 1 capsule (50,000 Units total) by mouth every 7 (seven) days. 12 capsule 0  . nitroGLYCERIN (NITROSTAT) 0.4 MG SL tablet Place 1 tablet (0.4 mg total) under the tongue every  5 (five) minutes as needed for chest pain. NEED REFILLS 30 tablet 1   No current facility-administered medications on file prior to visit.    Social History   Socioeconomic History  . Marital status: Divorced    Spouse name: Not on file  . Number of children: Not on file  . Years of education: college  . Highest education level: Not on file  Occupational History  . Occupation: disabled    Comment: due to [place]  . Occupation: full time student    Comment: Nursing  Social Needs  . Financial resource strain: Not on file  . Food insecurity:    Worry: Not on file    Inability: Not on file  . Transportation needs:     Medical: Not on file    Non-medical: Not on file  Tobacco Use  . Smoking status: Never Smoker  . Smokeless tobacco: Never Used  Substance and Sexual Activity  . Alcohol use: No  . Drug use: No  . Sexual activity: Not Currently    Birth control/protection: None, Abstinence  Lifestyle  . Physical activity:    Days per week: Not on file    Minutes per session: Not on file  . Stress: Not on file  Relationships  . Social connections:    Talks on phone: Not on file    Gets together: Not on file    Attends religious service: Not on file    Active member of club or organization: Not on file    Attends meetings of clubs or organizations: Not on file    Relationship status: Not on file  . Intimate partner violence:    Fear of current or ex partner: Not on file    Emotionally abused: Not on file    Physically abused: Not on file    Forced sexual activity: Not on file  Other Topics Concern  . Not on file  Social History Narrative   Divorced since 1998 after 1 year of marriage, first marriage. Caffeine use: carbonated beverage about 4 serving / day. Lives with best friend Collie Siad, son, best friend;s two sons in house.. Always uses seat belts. Exercise: Inactive. She graduated from [employer].   Family History  Problem Relation Age of Onset  . Alcohol abuse Mother   . Diabetes Mother   . Coronary artery disease Mother        Carotid Artery DIsease  . Hypertension Mother   . Diabetes Father   . Hypertension Father   . Kidney Stones Son   . Celiac disease Son   . Colon cancer Unknown   . Cancer Neg Hx        gyn       Objective:    BP 132/86   Pulse 79   Temp 98 F (36.7 C) (Oral)   Resp 16   Ht 5\' 1"  (1.549 m)   Wt 218 lb (98.9 kg)   SpO2 98%   BMI 41.19 kg/m  Physical Exam  Constitutional: She is oriented to person, place, and time. She appears well-developed and well-nourished. No distress.  HENT:  Head: Normocephalic and atraumatic.    Right Ear: External  ear normal.  Left Ear: External ear normal.  Nose: Nose normal.  Mouth/Throat: Oropharynx is clear and moist.  Eyes: Pupils are equal, round, and reactive to light. Conjunctivae and EOM are normal.  Neck: Normal range of motion. Neck supple. Carotid bruit is not present. No thyromegaly present.  Cardiovascular: Normal rate,  regular rhythm, normal heart sounds and intact distal pulses. Exam reveals no gallop and no friction rub.  No murmur heard. Pulmonary/Chest: Effort normal and breath sounds normal. She has no wheezes. She has no rales.  Abdominal: Soft. Bowel sounds are normal. She exhibits no distension and no mass. There is no tenderness. There is no rebound and no guarding.  Musculoskeletal:       Right shoulder: Normal.       Left shoulder: Normal.       Cervical back: Normal.  Lymphadenopathy:    She has no cervical adenopathy.  Neurological: She is alert and oriented to person, place, and time. She has normal strength and normal reflexes. No cranial nerve deficit or sensory deficit. She exhibits normal muscle tone. She displays a negative Romberg sign. Coordination and gait normal.  Skin: Skin is warm and dry. Rash noted. No purpura noted. Rash is not macular, not papular, not maculopapular, not nodular, not pustular, not vesicular and not urticarial. She is not diaphoretic.  Scattered erythema posterior scalp 4-83mm diameter.   Dystrophic thickened and discolored nails diffusely.  Psychiatric: She has a normal mood and affect. Her behavior is normal. Judgment and thought content normal.  Nursing note and vitals reviewed.  No results found. Depression screen [facility] 2/9 11/12/2017 06/28/2017 06/20/2017 06/19/2016 07/13/2015  Decreased Interest 0 2 2 0 0  Down, Depressed, Hopeless 0 1 2 0 0  PHQ - 2 Score 0 3 4 0 0  Altered sleeping - 2 2 - -  Tired, decreased energy - 2 2 - -  Change in appetite - 1 2 - -  Feeling bad or failure about yourself  - 1 2 - -  Trouble concentrating - 1 2 -  -  Moving slowly or fidgety/restless - 0 2 - -  Suicidal thoughts - 0 2 - -  PHQ-9 Score - 10 18 - -  Difficult doing work/chores - Somewhat difficult - - -   Fall Risk  11/12/2017 06/28/2017 06/20/2017 06/19/2016  Falls in the past year? No No No No        Assessment & Plan:   1. Dizziness   2. Rash and nonspecific skin eruption     Scalp rash:  New/recurrent; previously diagnosed as herpes zoster; early onset of rash and difficult to determine if to evolve into vesicular/herpes rash; treat with Prednisone and Valtrex.  Dizziness:  New onset; associated with scalp rash; normal neurological exam; obtain labs to rule out secondary causes; rx for Meclizine provided; recommend Eply maneuvers daily.  RTC immediately for acute worsening.  Orders Placed This Encounter  Procedures  . CBC with Differential/Platelet  . Comprehensive metabolic panel   Meds ordered this encounter  Medications  . predniSONE (DELTASONE) 20 MG tablet    Sig: Take 3 PO QAM x 1 day, 2 PO QAM x 5 days, 1 PO QAM x 5 days    Dispense:  18 tablet    Refill:  0  . meclizine (ANTIVERT) 25 MG tablet    Sig: Take 1 tablet (25 mg total) by mouth 3 (three) times daily as needed for dizziness.    Dispense:  30 tablet    Refill:  0    Return in about 6 months (around 05/31/2018) for follow-up chronic medical conditions.   Lilias Lorensen Elayne Guerin, M.D. Primary Care at [facility] previously [facility] [place] [address] [place], [place]  [phone] phone [phone] fax

## 2017-11-28 NOTE — Patient Instructions (Signed)
     IF you received an x-ray today, you will receive an invoice from [facility]. Please contact [facility] at [phone] with questions or concerns regarding your invoice.   IF you received labwork today, you will receive an invoice from [facility]. Please contact [facility] at [phone] with questions or concerns regarding your invoice.   Our billing staff will not be able to assist you with questions regarding bills from these companies.  You will be contacted with the lab results as soon as they are available. The fastest way to get your results is to activate your My Chart account. Instructions are located on the last page of this paperwork. If you have not heard from us regarding the results in 2 weeks, please contact this office.     

## 2017-11-29 LAB — COMPREHENSIVE METABOLIC PANEL WITH GFR
ALT: 50 IU/L — ABNORMAL HIGH (ref 0–32)
AST: 34 IU/L (ref 0–40)
Albumin/Globulin Ratio: 1.6 (ref 1.2–2.2)
Albumin: 4.4 g/dL (ref 3.5–5.5)
Alkaline Phosphatase: 100 IU/L (ref 39–117)
BUN/Creatinine Ratio: 13 (ref 9–23)
BUN: 10 mg/dL (ref 6–24)
Bilirubin Total: 0.4 mg/dL (ref 0.0–1.2)
CO2: 20 mmol/L (ref 20–29)
Calcium: 9.4 mg/dL (ref 8.7–10.2)
Chloride: 106 mmol/L (ref 96–106)
Creatinine, Ser: 0.75 mg/dL (ref 0.57–1.00)
GFR calc Af Amer: 107 mL/min/1.73
GFR calc non Af Amer: 93 mL/min/1.73
Globulin, Total: 2.7 g/dL (ref 1.5–4.5)
Glucose: 107 mg/dL — ABNORMAL HIGH (ref 65–99)
Potassium: 4.3 mmol/L (ref 3.5–5.2)
Sodium: 139 mmol/L (ref 134–144)
Total Protein: 7.1 g/dL (ref 6.0–8.5)

## 2017-11-29 LAB — CBC WITH DIFFERENTIAL/PLATELET
Basophils Absolute: 0.1 10*3/uL (ref 0.0–0.2)
Basos: 1 %
EOS (ABSOLUTE): 0.3 10*3/uL (ref 0.0–0.4)
EOS: 3 %
HEMATOCRIT: 45.2 % (ref 34.0–46.6)
Hemoglobin: 15.3 g/dL (ref 11.1–15.9)
IMMATURE GRANULOCYTES: 0 %
Immature Grans (Abs): 0 10*3/uL (ref 0.0–0.1)
Lymphocytes Absolute: 2.5 10*3/uL (ref 0.7–3.1)
Lymphs: 27 %
MCH: 30.5 pg (ref 26.6–33.0)
MCHC: 33.8 g/dL (ref 31.5–35.7)
MCV: 90 fL (ref 79–97)
MONOS ABS: 0.5 10*3/uL (ref 0.1–0.9)
Monocytes: 6 %
Neutrophils Absolute: 5.9 10*3/uL (ref 1.4–7.0)
Neutrophils: 63 %
PLATELETS: 291 10*3/uL (ref 150–450)
RBC: 5.02 x10E6/uL (ref 3.77–5.28)
RDW: 12.9 % (ref 12.3–15.4)
WBC: 9.2 10*3/uL (ref 3.4–10.8)

## 2017-12-02 ENCOUNTER — Encounter: Payer: Self-pay | Admitting: Family Medicine

## 2017-12-03 ENCOUNTER — Ambulatory Visit: Payer: Medicare Other | Admitting: Family Medicine

## 2017-12-30 ENCOUNTER — Encounter: Payer: Self-pay | Admitting: Family Medicine

## 2017-12-31 ENCOUNTER — Encounter: Payer: Self-pay | Admitting: Family Medicine

## 2017-12-31 NOTE — Telephone Encounter (Signed)
Please check status of toenail fungal culture from May 2019.

## 2018-01-06 ENCOUNTER — Encounter: Payer: Self-pay | Admitting: Family Medicine

## 2018-01-07 NOTE — Telephone Encounter (Signed)
Dr. Tamala Julian,  Aniceto Boss from [place] called to check on status of pending lab and per [facility] this lab is still pending, that this can take up to 60 days and that we should receive the results soon.

## 2018-01-07 NOTE — Telephone Encounter (Signed)
Please call [facility] to check status of fungal cx from 11/12/17.

## 2018-01-07 NOTE — Telephone Encounter (Signed)
No.  Culture is not complete; contacted [facility]; sensitivities and identification still in process.

## 2018-01-15 ENCOUNTER — Encounter: Payer: Self-pay | Admitting: Family Medicine

## 2018-01-27 ENCOUNTER — Encounter: Payer: Self-pay | Admitting: Family Medicine

## 2018-02-04 ENCOUNTER — Encounter: Payer: Self-pay | Admitting: Family Medicine

## 2018-02-04 LAB — FUNGUS CULTURE W SMEAR

## 2018-02-04 MED ORDER — HYDROCHLOROTHIAZIDE 25 MG PO TABS: 25.0000 mg | ORAL_TABLET | Freq: Every day | ORAL | 0 refills | Status: DC

## 2018-02-04 MED ORDER — DOXYCYCLINE HYCLATE 100 MG PO TABS: 100.0000 mg | ORAL_TABLET | Freq: Two times a day (BID) | ORAL | 0 refills | Status: AC

## 2018-02-05 ENCOUNTER — Encounter: Payer: Self-pay | Admitting: Family Medicine

## 2018-02-06 MED ORDER — FLUCONAZOLE 100 MG PO TABS
100.0000 mg | ORAL_TABLET | Freq: Every day | ORAL | 0 refills | Status: AC
Start: 2018-02-06 — End: ?

## 2018-02-06 NOTE — Telephone Encounter (Signed)
Forwarding to you :)

## 2018-02-08 ENCOUNTER — Encounter: Payer: Self-pay | Admitting: Family Medicine

## 2018-02-08 ENCOUNTER — Other Ambulatory Visit: Payer: Self-pay

## 2018-02-08 MED ORDER — VITAMIN D (ERGOCALCIFEROL) 1.25 MG (50000 UNIT) PO CAPS: 50000.0000 [IU] | ORAL_CAPSULE | ORAL | 0 refills | Status: DC

## 2018-05-15 ENCOUNTER — Other Ambulatory Visit: Payer: Self-pay | Admitting: Family Medicine
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
## 2018-08-06 ENCOUNTER — Other Ambulatory Visit: Payer: Self-pay | Admitting: Family Medicine
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
## 2018-08-12 ENCOUNTER — Other Ambulatory Visit: Payer: Self-pay | Admitting: Family Medicine

## 2018-08-12 NOTE — Telephone Encounter (Signed)
Courtesy refill. Left message to schedule OV.

## 2018-12-16 ENCOUNTER — Other Ambulatory Visit: Payer: Self-pay | Admitting: Family Medicine
